# Patient Record
Sex: Male | Born: 1965
Health system: Southern US, Community
[De-identification: ages and names within clinical notes are randomized; demographics above are authoritative.]

## PROBLEM LIST (undated history)

## (undated) DIAGNOSIS — N400 Enlarged prostate without lower urinary tract symptoms: Secondary | ICD-10-CM

## (undated) DIAGNOSIS — R519 Headache, unspecified: Secondary | ICD-10-CM

## (undated) DIAGNOSIS — K219 Gastro-esophageal reflux disease without esophagitis: Secondary | ICD-10-CM

## (undated) DIAGNOSIS — S37009A Unspecified injury of unspecified kidney, initial encounter: Secondary | ICD-10-CM

## (undated) DIAGNOSIS — R7303 Prediabetes: Secondary | ICD-10-CM

## (undated) DIAGNOSIS — R0600 Dyspnea, unspecified: Secondary | ICD-10-CM

## (undated) DIAGNOSIS — N289 Disorder of kidney and ureter, unspecified: Secondary | ICD-10-CM

## (undated) DIAGNOSIS — J309 Allergic rhinitis, unspecified: Secondary | ICD-10-CM

## (undated) DIAGNOSIS — R42 Dizziness and giddiness: Secondary | ICD-10-CM

## (undated) DIAGNOSIS — R51 Headache: Secondary | ICD-10-CM

## (undated) DIAGNOSIS — F4310 Post-traumatic stress disorder, unspecified: Secondary | ICD-10-CM

## (undated) DIAGNOSIS — R269 Unspecified abnormalities of gait and mobility: Secondary | ICD-10-CM

## (undated) DIAGNOSIS — M1990 Unspecified osteoarthritis, unspecified site: Secondary | ICD-10-CM

## (undated) DIAGNOSIS — I639 Cerebral infarction, unspecified: Secondary | ICD-10-CM

## (undated) DIAGNOSIS — S0990XA Unspecified injury of head, initial encounter: Secondary | ICD-10-CM

## (undated) DIAGNOSIS — F40240 Claustrophobia: Secondary | ICD-10-CM

## (undated) DIAGNOSIS — G9520 Unspecified cord compression: Secondary | ICD-10-CM

## (undated) DIAGNOSIS — F419 Anxiety disorder, unspecified: Secondary | ICD-10-CM

## (undated) DIAGNOSIS — G8929 Other chronic pain: Secondary | ICD-10-CM

## (undated) DIAGNOSIS — T8859XA Other complications of anesthesia, initial encounter: Secondary | ICD-10-CM

## (undated) DIAGNOSIS — N1832 Chronic kidney disease, stage 3b: Secondary | ICD-10-CM

## (undated) DIAGNOSIS — J45909 Unspecified asthma, uncomplicated: Secondary | ICD-10-CM

## (undated) DIAGNOSIS — R531 Weakness: Secondary | ICD-10-CM

## (undated) DIAGNOSIS — M436 Torticollis: Secondary | ICD-10-CM

## (undated) HISTORY — DX: Other chronic pain: G89.29

## (undated) HISTORY — PX: FINGER FRACTURE SURGERY: SHX638

## (undated) HISTORY — DX: Headache: R51

## (undated) HISTORY — PX: NECK SURGERY: SHX720

## (undated) HISTORY — PX: EYE MUSCLE SURGERY: SHX370

## (undated) HISTORY — PX: BACK SURGERY: SHX140

## (undated) HISTORY — PX: SHOULDER SURGERY: SHX246

## (undated) HISTORY — PX: DIAGNOSTIC LAPAROSCOPY: SUR761

## (undated) HISTORY — DX: Dizziness and giddiness: R42

## (undated) HISTORY — DX: Unspecified abnormalities of gait and mobility: R26.9

## (undated) HISTORY — DX: Allergic rhinitis, unspecified: J30.9

## (undated) HISTORY — DX: Headache, unspecified: R51.9

---

## 1988-06-29 HISTORY — PX: SHOULDER SURGERY: SHX246

## 1994-06-29 HISTORY — PX: BACK SURGERY: SHX140

## 2008-06-29 DIAGNOSIS — S0990XA Unspecified injury of head, initial encounter: Secondary | ICD-10-CM

## 2008-06-29 HISTORY — DX: Unspecified injury of head, initial encounter: S09.90XA

## 2008-10-11 DIAGNOSIS — K219 Gastro-esophageal reflux disease without esophagitis: Secondary | ICD-10-CM | POA: Insufficient documentation

## 2008-10-11 DIAGNOSIS — M5382 Other specified dorsopathies, cervical region: Secondary | ICD-10-CM | POA: Insufficient documentation

## 2008-10-11 DIAGNOSIS — M5000 Cervical disc disorder with myelopathy, unspecified cervical region: Secondary | ICD-10-CM | POA: Insufficient documentation

## 2008-10-11 DIAGNOSIS — F419 Anxiety disorder, unspecified: Secondary | ICD-10-CM | POA: Insufficient documentation

## 2009-02-04 ENCOUNTER — Ambulatory Visit: Payer: Self-pay | Admitting: Family Medicine

## 2009-02-04 ENCOUNTER — Observation Stay (HOSPITAL_COMMUNITY)
Admission: EM | Admit: 2009-02-04 | Discharge: 2009-02-07 | Payer: Self-pay | Source: Home / Self Care | Admitting: Emergency Medicine

## 2009-02-07 ENCOUNTER — Ambulatory Visit: Payer: Self-pay | Admitting: Physical Medicine & Rehabilitation

## 2009-11-15 ENCOUNTER — Encounter: Admission: RE | Admit: 2009-11-15 | Discharge: 2009-11-15 | Payer: Self-pay | Admitting: Neurosurgery

## 2009-12-26 ENCOUNTER — Encounter: Admission: RE | Admit: 2009-12-26 | Discharge: 2009-12-26 | Payer: Self-pay | Admitting: Neurosurgery

## 2010-08-08 ENCOUNTER — Other Ambulatory Visit (HOSPITAL_COMMUNITY): Payer: Self-pay | Admitting: Neurosurgery

## 2010-08-08 DIAGNOSIS — M47812 Spondylosis without myelopathy or radiculopathy, cervical region: Secondary | ICD-10-CM

## 2010-08-18 ENCOUNTER — Ambulatory Visit (HOSPITAL_COMMUNITY)
Admission: RE | Admit: 2010-08-18 | Discharge: 2010-08-18 | Disposition: A | Discharge status: Home / Self Care | Payer: Worker's Compensation | Source: Ambulatory Visit | Attending: Neurosurgery | Admitting: Neurosurgery

## 2010-08-18 DIAGNOSIS — M47812 Spondylosis without myelopathy or radiculopathy, cervical region: Secondary | ICD-10-CM

## 2010-08-18 DIAGNOSIS — M79609 Pain in unspecified limb: Secondary | ICD-10-CM | POA: Insufficient documentation

## 2010-08-18 DIAGNOSIS — Z981 Arthrodesis status: Secondary | ICD-10-CM | POA: Insufficient documentation

## 2010-08-18 DIAGNOSIS — R259 Unspecified abnormal involuntary movements: Secondary | ICD-10-CM | POA: Insufficient documentation

## 2010-08-18 DIAGNOSIS — M542 Cervicalgia: Secondary | ICD-10-CM | POA: Insufficient documentation

## 2010-08-18 MED ORDER — IOHEXOL 300 MG/ML  SOLN
10.0000 mL | Freq: Once | INTRAMUSCULAR | Status: AC | PRN
  Administered 2010-08-18: 10 mL via INTRAVENOUS

## 2010-10-05 LAB — BASIC METABOLIC PANEL
BUN: 16 mg/dL (ref 6–23)
BUN: 16 mg/dL (ref 6–23)
BUN: 9 mg/dL (ref 6–23)
CO2: 23 mEq/L (ref 19–32)
CO2: 23 mEq/L (ref 19–32)
CO2: 24 mEq/L (ref 19–32)
Calcium: 8.8 mg/dL (ref 8.4–10.5)
Calcium: 8.8 mg/dL (ref 8.4–10.5)
Calcium: 8.9 mg/dL (ref 8.4–10.5)
Chloride: 103 mEq/L (ref 96–112)
Chloride: 106 mEq/L (ref 96–112)
Chloride: 109 mEq/L (ref 96–112)
Creatinine, Ser: 1.34 mg/dL (ref 0.4–1.5)
Creatinine, Ser: 1.36 mg/dL (ref 0.4–1.5)
Creatinine, Ser: 1.39 mg/dL (ref 0.4–1.5)
GFR calc Af Amer: >60 mL/min (ref >60)
GFR calc Af Amer: >60 mL/min (ref >60)
GFR calc Af Amer: >60 mL/min (ref >60)
GFR calc non Af Amer: 55 mL/min — ABNORMAL LOW (ref >60)
GFR calc non Af Amer: 57 mL/min — ABNORMAL LOW (ref >60)
GFR calc non Af Amer: 58 mL/min — ABNORMAL LOW (ref >60)
Glucose, Bld: 135 mg/dL — ABNORMAL HIGH (ref 70–99)
Glucose, Bld: 175 mg/dL — ABNORMAL HIGH (ref 70–99)
Glucose, Bld: 86 mg/dL (ref 70–99)
Potassium: 3.8 mEq/L (ref 3.5–5.1)
Potassium: 4 mEq/L (ref 3.5–5.1)
Potassium: 4.2 mEq/L (ref 3.5–5.1)
Sodium: 135 mEq/L (ref 135–145)
Sodium: 138 mEq/L (ref 135–145)
Sodium: 139 mEq/L (ref 135–145)

## 2010-10-05 LAB — CBC
HCT: 43.9 % (ref 39.0–52.0)
HCT: 45.4 % (ref 39.0–52.0)
Hemoglobin: 15.1 g/dL (ref 13.0–17.0)
Hemoglobin: 15.6 g/dL (ref 13.0–17.0)
MCHC: 34.3 g/dL (ref 30.0–36.0)
MCHC: 34.3 g/dL (ref 30.0–36.0)
MCV: 92.7 fL (ref 78.0–100.0)
MCV: 93 fL (ref 78.0–100.0)
Platelets: 168 10*3/uL (ref 150–400)
Platelets: 174 10*3/uL (ref 150–400)
RBC: 4.73 MIL/uL (ref 4.22–5.81)
RBC: 4.9 MIL/uL (ref 4.22–5.81)
RDW: 12.4 % (ref 11.5–15.5)
RDW: 12.4 % (ref 11.5–15.5)
WBC: 4.5 10*3/uL (ref 4.0–10.5)
WBC: 7.7 10*3/uL (ref 4.0–10.5)

## 2010-10-05 LAB — COMPREHENSIVE METABOLIC PANEL
ALT: 20 U/L (ref 0–53)
AST: 20 U/L (ref 0–37)
Albumin: 3.6 g/dL (ref 3.5–5.2)
Alkaline Phosphatase: 59 U/L (ref 39–117)
BUN: 15 mg/dL (ref 6–23)
CO2: 25 mEq/L (ref 19–32)
Calcium: 9.3 mg/dL (ref 8.4–10.5)
Chloride: 107 mEq/L (ref 96–112)
Creatinine, Ser: 1.42 mg/dL (ref 0.4–1.5)
GFR calc Af Amer: >60 mL/min (ref >60)
GFR calc non Af Amer: 54 mL/min — ABNORMAL LOW (ref >60)
Glucose, Bld: 132 mg/dL — ABNORMAL HIGH (ref 70–99)
Potassium: 4.7 mEq/L (ref 3.5–5.1)
Sodium: 138 mEq/L (ref 135–145)
Total Bilirubin: 1.1 mg/dL (ref 0.3–1.2)
Total Protein: 6.2 g/dL (ref 6.0–8.3)

## 2010-10-05 LAB — DIFFERENTIAL
Basophils Absolute: 0 10*3/uL (ref 0.0–0.1)
Basophils Relative: 0 % (ref 0–1)
Eosinophils Absolute: 0.1 10*3/uL (ref 0.0–0.7)
Eosinophils Relative: 1 % (ref 0–5)
Lymphocytes Relative: 25 % (ref 12–46)
Lymphs Abs: 1.1 10*3/uL (ref 0.7–4.0)
Monocytes Absolute: 0.4 10*3/uL (ref 0.1–1.0)
Monocytes Relative: 9 % (ref 3–12)
Neutro Abs: 2.9 10*3/uL (ref 1.7–7.7)
Neutrophils Relative %: 64 % (ref 43–77)

## 2010-10-05 LAB — CSF CELL COUNT WITH DIFFERENTIAL
Eosinophils, CSF: 0 % (ref 0–1)
Lymphs, CSF: 66 % (ref 40–80)
Monocyte-Macrophage-Spinal Fluid: 30 % (ref 15–45)
RBC Count, CSF: 11 /mm3 — ABNORMAL HIGH
Segmented Neutrophils-CSF: 4 % (ref 0–6)
Tube #: 2
WBC, CSF: 2 /mm3 (ref 0–5)

## 2010-10-05 LAB — DRUGS OF ABUSE SCREEN W/O ALC, ROUTINE URINE
Amphetamine Screen, Ur: NEGATIVE
Barbiturate Quant, Ur: NEGATIVE
Benzodiazepines.: NEGATIVE
Cocaine Metabolites: NEGATIVE
Creatinine,U: 146.6 mg/dL
Marijuana Metabolite: NEGATIVE
Methadone: NEGATIVE
Opiate Screen, Urine: NEGATIVE
Phencyclidine (PCP): NEGATIVE
Propoxyphene: NEGATIVE

## 2010-10-05 LAB — RPR: RPR Ser Ql: NONREACTIVE

## 2010-10-05 LAB — TSH: TSH: 0.997 u[IU]/mL (ref 0.350–4.500)

## 2010-10-05 LAB — GLUCOSE, CAPILLARY
Glucose-Capillary: 121 mg/dL — ABNORMAL HIGH (ref 70–99)
Glucose-Capillary: 128 mg/dL — ABNORMAL HIGH (ref 70–99)
Glucose-Capillary: 134 mg/dL — ABNORMAL HIGH (ref 70–99)
Glucose-Capillary: 134 mg/dL — ABNORMAL HIGH (ref 70–99)
Glucose-Capillary: 139 mg/dL — ABNORMAL HIGH (ref 70–99)
Glucose-Capillary: 161 mg/dL — ABNORMAL HIGH (ref 70–99)
Glucose-Capillary: 165 mg/dL — ABNORMAL HIGH (ref 70–99)
Glucose-Capillary: 176 mg/dL — ABNORMAL HIGH (ref 70–99)
Glucose-Capillary: 176 mg/dL — ABNORMAL HIGH (ref 70–99)
Glucose-Capillary: 195 mg/dL — ABNORMAL HIGH (ref 70–99)

## 2010-10-05 LAB — HEMOGLOBIN A1C
Hgb A1c MFr Bld: 5.3 % (ref 4.6–6.1)
Mean Plasma Glucose: 105 mg/dL

## 2010-10-05 LAB — PROTEIN AND GLUCOSE, CSF
Glucose, CSF: 86 mg/dL — ABNORMAL HIGH (ref 43–76)
Total  Protein, CSF: 57 mg/dL — ABNORMAL HIGH (ref 15–45)

## 2010-10-05 LAB — VITAMIN B12: Vitamin B-12: 362 pg/mL (ref 211–911)

## 2010-10-05 LAB — SEDIMENTATION RATE: Sed Rate: 2 mm/hr (ref 0–16)

## 2010-11-11 NOTE — Discharge Summary (Signed)
NAMEDIQUAN, CHEHAB NO.:  [id number]   MEDICAL RECORD NO.:  [id number]          PATIENT TYPE:  OBV   LOCATION:  3004                         FACILITY:  [facility]   PHYSICIAN:  Dickie La, MD        DATE OF BIRTH:  10-08-1965   DATE OF ADMISSION:  02/04/2009  DATE OF DISCHARGE:  02/07/2009                               DISCHARGE SUMMARY   PRIMARY CARE Ardis Lawley:  Dr. Rosanna Randy in [facility], phone  number [phone].   DISCHARGE DIAGNOSES:  1. Cervical myelopathy  2. Hyperglycemia secondary to steroids.  3. Tobacco abuse.  4. Chronic kidney disease   DISCHARGE MEDICATIONS:  1. Motrin 600 mg p.o. q.8 h. p.r.n. pain.  2. Dexamethasone 2 mg p.o. q.6 h. x3 days.  3. Omeprazole 20 mg p.o. daily x1 week.   CONSULTS:  [facility] initially consulted Dr. Joya Salm,  however, his primary neurosurgeon is Dr. Carloyn Manner at the same practice, the  phone number is [phone].   PROCEDURES:  He had a Interventional Radiology guided a lumbar puncture  and myelogram on February 06, 2009, which was without complications.   LABORATORY DATA:  CBC upon admission was essentially within normal  limits.  BMET upon admission was within normal limits with the exception  of a creatinine of 1.39.  TSH, B12, sed rate were all within normal  limits, as was the urine drug screen, RPR, and A1c.  His creatinine  maintained at baseline of the high 1.3  His lumbar puncture results were  glucose of 86, slightly elevated total protein of 57, slightly elevated  cell count with 2 white blood cells and 11 red blood cells.  His  discharge labs were also essentially within normal limits with the  exception of slightly elevated glucose of 175.  Of note, his glucose  that elevate to the lower 200s, high 100s following initiation of  dexamethasone.  It was controlled with sliding scale insulin.   BRIEF HOSPITAL COURSE:  Mr. Helming is a 45 year old male with a past  medical history  significant for prior cervical surgery for cervical  spinal stenosis with fusion of C4 through C7 done in 1996.  He presented  to the ED after having some  trauma to his head during fall. He had  right-sided weakness.  His physical exam was significant for right-sided  weakness upper and lower extremity and myelopathic signs.  He had  cervical and brain MRI that was icomplicated by artifact from indwelling  cervical harware from previous neck surgery.  CT myelogram was ordered,  which did not show  spinal stenosis.  [facility],was  consulted and they recommended no surgery.recommendation was for steroid  therapy and outpatient rehab.  He responded well to oral steroid therapy  and was discharged with instructions for outpatient physical therapy.  Of note, as noted above, his glucose did trend upwards with steroids.  He was controlled with sliding scale insulin of around about 5 units per  day.  He was not discharged on any insulin as his plan was to continue  steroids for only 3 more  days.   PENDING ISSUES TO BE FOLLOWED UP:  Mr. Vado did have hyperglycemia,  which is secondary to steroids.  We would encourage his primary care  Rei Medlen to followup the fasting glucose level when he presents after  cessation of the steroid use.   FOLLOWUP APPOINTMENTS:  He was asked to schedule followup appointments  with Dr. Carloyn Manner at [facility] in 10-14 days and Dr. Rosanna Randy at  [facility] in 10-14 days.  He was provided phone  numbers.   DISCHARGE INSTRUCTIONS:  Mr. Weatherley was discharged with a diabetic  diet.  His activity restrictions were per physical therapies and  instructed to use a walker to ambulate secondary to a increased fall  risk.  We asked him not to return to work until he is cleared by his  neurosurgeon Dr. Carloyn Manner and his check up in 1-2 weeks.   DISCHARGE CONDITION:  The patient was discharged to home in stable  medical condition.      Lynne Leader, MD  Electronically Signed      Dickie La, MD  Electronically Signed    EC/MEDQ  D:  02/07/2009  T:  02/07/2009  Job:  [id number]   cc:   Althea Charon, M.D.

## 2010-11-11 NOTE — H&P (Signed)
NAMEASHUR, HUDMAN NO.:  [id number]   MEDICAL RECORD NO.:  [id number]          PATIENT TYPE:  OBV   LOCATION:  3004                         FACILITY:  [facility]   PHYSICIAN:  Dickie La, MD        DATE OF BIRTH:  1965-09-17   DATE OF ADMISSION:  02/04/2009  DATE OF DISCHARGE:                              HISTORY & PHYSICAL   PRIMARY CARE Kaelyn Nauta:  Dr. Miguel Aschoff at [facility].   HISTORY OF PRESENT ILLNESS:  The patient presents to [facility] ED  following 1 week of worsening right-sided weakness and numbness.  Mr.  Camera past medical history is significant for cervical spinal  fusion, following what appears spinal stenosis in 1996.  One and a half-  weeks ago, Mr. Mihelic hit the top of his head on a pipe while he was  climbing a ladder at work.  He felt as though he may pass out at that  time, but he did not.  He then climbed down and continued working.  His  current symptoms began a few days after he hit his head, which would be  about 1 week ago.  He describes a feeling of numbness and tingling in  his right arm and leg.  The feeling extends into his entire hand and  down his right leg, but does not include his foot.  He also describes  subtle weakness in his right hand. He has not been able to use or hold a  screwdriver correctly.  He also complains of right cheek and tongue  numbness, but he has no slurred speech and denies any difficulty  swallowing.  He also complains of a whole body tremor when he moves and  after he moves.  However, he has had this in the past, but it is worse  now.  He also notes a band-like stabbing right-sided headache that comes  and goes, this occurs without aura.  He describes any vision change or  difficulty concentrating.  He denies any urinary incontinence or  retention of the bowel or bladder.  He has continued to work into the  symptoms, but they came so bothersome that he had to see a doctor  today.  He does also note some difficulty walking, but he denies any falling.   PAST MEDICAL HISTORY:  His tremor has been diagnosed at a benign  essential tremor which failed treatment with a beta blocker.  He also has an old and stable left eye deviation.  Otherwise he has no medical problems and takes no medication with the  exception of Tylenol and ibuprofen for pain occasionally.   ALLERGIES:  CODEINE, which make some nauseated.   PAST SURGICAL HISTORY:  In 1996, cervical fusion of C4 and C7 for what  appears to be spinal stenosis; records are not available at this time.   FAMILY HISTORY:  His grandparents have diabetes, and he has aunts and  uncles with ALS and Parkinson disease respectively.   SOCIAL HISTORY:  He lives in [place], [place], with his wife.  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  He dips about a 10 every 1-1/2  days, and he has no alcohol or drug use.   REVIEW OF SYSTEMS:  A 12-point review of systems is negative except for  what is noted in HPI.  Specifically, he denies any fever, chills, night  sweats, and has no systemic systems.  He also denies any chest pain or  difficulty breathing.   PHYSICAL EXAMINATION:  VITAL SIGNS:  Temperature is 98.1, heart rate is  64, blood pressure 118/77, respiration 20, and sating at 99% on room  air.  GENERAL:  He is a right-handed male and he is sitting on the exam room  bed, in no acute distress.  NECK:  He has no JVD noted.  We also noted an old scar on his dorsal  neck and on his left ventral neck.  LUNGS:  Clear to auscultation bilaterally.  HEART:  Regular rate and rhythm.  No murmurs, rubs, or gallops.  ABDOMEN:  Soft and nontender with normoactive bowel sounds.  EXTREMITIES:  Nonedematous.  No rash, and no spooning or cyanosis of the  nails.  NEURO:  1. He is alert and oriented.  2. His speech is not slurred and his thought patterns are      comprehensible.  3. Cranial nerves:  II:  Grossly  intact.  III through VI:  Old left eye deviation.  This is not new.  Otherwise  EMOI and pupils equal and reactive to light.  V:  Right-sided decreased pinprick compared to left.  VII:  Right is equal to left.  VIII:  Grossly normal.  IX and X:  Right is equal to left.  Palate elevation is symmetric.  XI:  Right is equal to left.  XII:  Tongue deviates to right.  1. Sensation:  Pinprick, right is less than left on arm and lower      extremities, with no saddle anesthesia to history.  2. Strength:  He has pronator drift on his right arm.  Cervical roots      4-7, right is equal to left with the exception of grip where it is      subtle, right is less than left, which are described as a 4-1/2 out      of 5.  His lower extremities are 5/5 and symmetric.  He is able to      stand on his toes.  3. Reflexes:  He is hyperreflexic, but without clonus on his Achilles      as well as his biceps and brachioradialis.  His toes are downgoing      bilaterally.  His rectal tone is normal and he can squeeze.  4. Cerebellum:  He has no finger-to-nose finger ataxia.  However, his      gait appears to be ataxic and he looses his balance when asked to      stand with his eyes closed.  He cannot stand on one foot.   LABORATORY DATA:  CBC:  White count of 4.5, H and H 15.1/43.4, platelets  are 168, his PMNs are 68%.  His complete metabolic panel is within  normal limits with the exception of a creatinine which is 1.39.  He had  a noncontrast head CT, which showed no acute findings; and he has had AP  and lateral cervical films which showed no acute findings, and they  noted the stable internal hardware on the ventral surface of his neck.   ASSESSMENT AND PLAN:  This is a 45 year old male with right-sided  weakness.   PROBLEM:  1. Neuro:  This is true right-sided weakness.  He has pronator drift.      Differential includes a cervical stenosis with neck etiology versus      a brainstem finding or a subtle  subdural bleed.  We will evaluate      further with an MRI of the head and neck without contrast.  We have      consulted a radiologist to make this determination, and we feel it      is safe and will be a good test with the hardware he had in his      neck.  We will consult either Neurology or Neurosurgery depending      on the etiology and cause of these symptoms, depending on what we      find on the imaging study.  We do not feel that infection is a      likely cause due to his afebrile nature and lack of elevated white      blood cell counts.  We feel that his neck is stable based on our      exam showing no midline tenderness and normal for him, x-rays of      his neck.  Therefore, we will not put him in a cervical collar for      neck stabilization.  2. Renal function.  The patient is not a normal doctor goer, so we are      unable to determine if this is an acute renal injury or a chronic      renal disease.  We will follow daily labs and have him follow up      with the PCP as long as his renal function remained stable.  We      will attempt to avoid nephrotoxic agents.  3. Prophylaxis.  We will use heparin 5000 units 3 times a day      subcutaneous for VTE prophylaxis.  4. Disposition.  We will plan discharge pending the patient's need for      OR; at this time, we are unable to know how long he will stay in      the hospital.      Lynne Leader, MD  Electronically Signed      Dickie La, MD  Electronically Signed    EC/MEDQ  D:  02/04/2009  T:  02/05/2009  Job:  [id number]

## 2010-11-11 NOTE — Consult Note (Signed)
NAMEMUHANAD, BJORNSON NO.:  [id number]   MEDICAL RECORD NO.:  [id number]          PATIENT TYPE:  OBV   LOCATION:  3004                         FACILITY:  [facility]   PHYSICIAN:  Leeroy Cha, M.D.   DATE OF BIRTH:  1965/11/28   DATE OF CONSULTATION:  02/05/2009  DATE OF DISCHARGE:                                 CONSULTATION   Mr. Sickel is a gentleman who 5 days ago, he was at work and when he  tried to reach above him with his ladder, he hit his neck and  immediately he developed some electricity running down to his neck.  Nevertheless, he did not seek any medical treatment because he thought  it was some swelling and that will go away.  Yesterday, he came to the  emergency room, he was admitted by the Teaching Service.  It was noticed  that with this symptoms, he had difficulty walking, he had tremor, and  he had to use a walker to get around.  He complaint of numbness in the  right side of the face, numbness in the right side of his body, and he  has difficulty walking.  Clinically, I cannot find any weakness, but his  reflexes are 3+  with no Babinski.  He has ataxia, he cannot walk in a  straight line.  He previously had a cervical fusion in 1996 from C4  through C7.  The cervical MRI was difficult to be read secondary to the  metal.  The MRI of the brain is normal.   CLINICAL IMPRESSION:  Cervical myelopathy secondary to herniated disk 3-  4, rule out spinal cord injury.   RECOMMENDATIONS:  I talked to Mr. Musselman personally.  We are going to  set him tomorrow to have a cervical myelogram and during the myelogram,  we are going to obtain a spinal fluid to be sent to the laboratory.  He  is really worried because he does not know what is going on, I told him  that hopefully with this test, we will find out what needs to be done.  Because I do not have any diagnosis yet, I am going to start him on p.o.  Decadron.            ______________________________  Leeroy Cha, M.D.     EB/MEDQ  D:  02/05/2009  T:  02/05/2009  Job:  [id number]

## 2011-03-03 ENCOUNTER — Other Ambulatory Visit (HOSPITAL_COMMUNITY): Payer: Self-pay | Admitting: Specialist

## 2011-03-03 DIAGNOSIS — R51 Headache: Secondary | ICD-10-CM

## 2011-03-03 DIAGNOSIS — R42 Dizziness and giddiness: Secondary | ICD-10-CM

## 2011-03-11 ENCOUNTER — Ambulatory Visit (HOSPITAL_COMMUNITY)
Admission: RE | Admit: 2011-03-11 | Discharge: 2011-03-11 | Disposition: A | Discharge status: Home / Self Care | Payer: Worker's Compensation | Source: Ambulatory Visit | Attending: Specialist | Admitting: Specialist

## 2011-03-11 DIAGNOSIS — R51 Headache: Secondary | ICD-10-CM

## 2011-03-11 DIAGNOSIS — R42 Dizziness and giddiness: Secondary | ICD-10-CM | POA: Insufficient documentation

## 2011-03-11 LAB — CREATININE, SERUM
Creatinine, Ser: 1.36 mg/dL — ABNORMAL HIGH (ref 0.50–1.35)
GFR calc Af Amer: >60 mL/min (ref >60)
GFR calc non Af Amer: 56 mL/min — ABNORMAL LOW (ref >60)

## 2011-03-11 LAB — BUN: BUN: 13 mg/dL (ref 6–23)

## 2011-03-11 MED ORDER — GADOBENATE DIMEGLUMINE 529 MG/ML IV SOLN
20.0000 mL | Freq: Once | INTRAVENOUS | Status: AC
  Administered 2011-03-11: 20 mL via INTRAVENOUS

## 2011-03-12 ENCOUNTER — Emergency Department (HOSPITAL_COMMUNITY)
Admission: EM | Admit: 2011-03-12 | Discharge: 2011-03-12 | Discharge status: Against Medical Advice | Payer: Self-pay | Attending: Emergency Medicine | Admitting: Emergency Medicine

## 2011-03-12 DIAGNOSIS — R238 Other skin changes: Secondary | ICD-10-CM | POA: Insufficient documentation

## 2011-03-12 DIAGNOSIS — R509 Fever, unspecified: Secondary | ICD-10-CM | POA: Insufficient documentation

## 2011-03-23 ENCOUNTER — Ambulatory Visit: Payer: Self-pay | Admitting: Family Medicine

## 2011-03-27 ENCOUNTER — Ambulatory Visit: Payer: Self-pay | Admitting: Family Medicine

## 2012-08-19 ENCOUNTER — Ambulatory Visit: Payer: Self-pay | Admitting: Family Medicine

## 2013-03-14 ENCOUNTER — Encounter: Payer: Self-pay | Admitting: Pulmonary Disease

## 2013-03-14 ENCOUNTER — Ambulatory Visit (INDEPENDENT_AMBULATORY_CARE_PROVIDER_SITE_OTHER): Payer: BC Managed Care – PPO | Admitting: Pulmonary Disease

## 2013-03-14 VITALS — BP 104/70 | HR 74 | Temp 98.0°F | Ht 72.0 in | Wt 222.0 lb

## 2013-03-14 DIAGNOSIS — R0602 Shortness of breath: Secondary | ICD-10-CM

## 2013-03-14 NOTE — Assessment & Plan Note (Addendum)
Randy Dean has a normal physical exam aside from some mild weakness in the right arm and leg which he attributes to his traumatic brain injury. His lung exam and cardiac exam are normal and do not show evidence of clear pulmonary cardiac disease. Further, his ambulatory oximetry testing was normal.  I explained to him that shortness of breath concomitant from multiple different problems including neurologic conditions, pulmonary conditions, cardiac conditions, and hematologic conditions to name a few. We will obtain records from his primary care doctor's office to see the results of his most recent lab work. I agree with the cardiology evaluation which will happen this week.  From a pulmonary perspective, we will order full pulmonary function testing as well as a repeat chest x-ray to look for evidence of underlying pulmonary disease. At this point, I don't see clear evidence of any which is a good thing for him.  I suspect that there is an anxiety component here.  Followup 2 weeks.

## 2013-03-14 NOTE — Progress Notes (Signed)
Subjective:    Patient ID: Randy Dean, male    DOB: [birth year], 47 y.o.   MRN: [id number]  HPI  Randy Dean is a very pleasant 47 year old male who comes to our clinic to establish care for shortness of breath. He never had respiratory illnesses as a child and smoked one half pack of cigarettes daily from age 53 until age 33. He never smoked after that. 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 While working for a [employer], he unfortunately sustained major head trauma from a steel bar which led to traumatic brain injury as well as cervical spinal injury. Neither of these injuries required surgical repair.   In the last year he has experienced increasing shortness of breath. This started gradually and has been slowly progressing. He does feel some shortness of breath with minimal activity such as walking on level ground from 50-75 feet in distance. He will occasionally at rest feel the need to gasp for air. Nothing clearly makes the shortness of breath better. He does note that the mold will make his shortness of breath worse. He does not have mold in his home but he is at his church nearly every day where he is exposed to mold as they have a damp basement. He has noted even some shortness of breath in his sleep and now he has to sleep sitting up. When he is in the shower he cannot hold his breath long enough to read soft his face. He has cough productive of clear sputum which is sometimes green. He has not tried inhaled therapies. He occasionally has some chest pain that is not always associated with the shortness of breath. It is a sharp pain sometimes isolated to the right chest other times in the left chest, sometimes both. He is going to see cardiology for this this week.   Past Medical History  Diagnosis Date  . Allergic rhinitis   . Chronic headaches   . Vertigo      Family  History  Problem Relation Age of Onset  . Fibromyalgia Mother   . Heart disease Father   . Tremor Father   . Diabetes Maternal Grandmother   . Hypertension Maternal Grandmother   . Hypertension Maternal Grandfather   . Diabetes Paternal Grandmother   . Hypertension Paternal Grandmother   . Hypertension Paternal Grandfather      History   Social History  . Marital Status: Married    Spouse Name: N/A    Number of Children: N/A  . Years of Education: N/A   Occupational History  . Not on file.   Social History Main Topics  . Smoking status: Never Smoker   . Smokeless tobacco: Current User  . Alcohol Use: No  . Drug Use: No  . Sexual Activity: Not on file   Other Topics Concern  . Not on file   Social History Narrative  . No narrative on file     Allergies  Allergen Reactions  . Codeine   . Serzone [Nefazodone]      No outpatient prescriptions prior to visit.   No facility-administered medications prior to visit.       Review of Systems  Constitutional: Positive for appetite change. Negative for fever, chills, diaphoresis, activity change, fatigue and unexpected weight change.  HENT: Positive for congestion and sneezing. Negative for hearing loss, ear pain, nosebleeds, sore throat,  facial swelling, rhinorrhea, mouth sores, trouble swallowing, neck pain, neck stiffness, dental problem, voice change, postnasal drip, sinus pressure, tinnitus and ear discharge.   Eyes: Negative for photophobia, discharge, itching and visual disturbance.  Respiratory: Positive for cough and shortness of breath. Negative for apnea, choking, chest tightness, wheezing and stridor.   Cardiovascular: Positive for chest pain. Negative for palpitations and leg swelling.  Gastrointestinal: Positive for abdominal pain. Negative for nausea, vomiting, constipation, blood in stool and abdominal distention.  Genitourinary: Negative for dysuria, urgency, frequency, hematuria, flank pain, decreased  urine volume and difficulty urinating.  Musculoskeletal: Positive for joint swelling. Negative for myalgias, back pain, arthralgias and gait problem.  Skin: Negative for color change, pallor and rash.       Itching  Neurological: Positive for headaches. Negative for dizziness, tremors, seizures, syncope, speech difficulty, weakness, light-headedness and numbness.  Hematological: Negative for adenopathy. Does not bruise/bleed easily.  Psychiatric/Behavioral: Negative for confusion, sleep disturbance and agitation. The patient is nervous/anxious.        Objective:   Physical Exam  Filed Vitals:   03/14/13 1144  BP: 104/70  Pulse: 74  Temp: 98 F (36.7 C)  TempSrc: Oral  Height: 6' (1.829 m)  Weight: 222 lb (100.699 kg)  SpO2: 97%   Gen: well appearing, no acute distress HEENT: NCAT, PERRL, L eye laterally deviated, otherwise R EOMi, OP clear, neck supple without masses PULM: CTA B CV: RRR, no mgr, no JVD AB: BS+, soft, nontender, no hsm Ext: warm, no edema, no clubbing, no cyanosis Derm: no rash or skin breakdown Neuro: A&Ox4, CN II-XII intact, strength 5/5 in Right, 4+/5 L grip, hip flexor and      Assessment & Plan:   Shortness of breath Randy Dean has a normal physical exam aside from some mild weakness in the right arm and leg which he attributes to his traumatic brain injury. His lung exam and cardiac exam are normal and do not show evidence of clear pulmonary cardiac disease. Further, his ambulatory oximetry testing was normal.  I explained to him that shortness of breath concomitant from multiple different problems including neurologic conditions, pulmonary conditions, cardiac conditions, and hematologic conditions to name a few. We will obtain records from his primary care doctor's office to see the results of his most recent lab work. I agree with the cardiology evaluation which will happen this week.  From a pulmonary perspective, we will order full pulmonary function  testing as well as a repeat chest x-ray to look for evidence of underlying pulmonary disease. At this point, I don't see clear evidence of any which is a good thing for him.  I suspect that there is an anxiety component here.  Followup 2 weeks.    Updated Medication List Outpatient Encounter Prescriptions as of 03/14/2013  Medication Sig Dispense Refill  . ALPRAZolam (XANAX) 0.5 MG tablet Take 1 tablet by mouth 2 (two) times daily as needed.      Marland Kitchen dexlansoprazole (DEXILANT) 60 MG capsule Take 60 mg by mouth daily.      . diazepam (VALIUM) 5 MG tablet Take 2.5-5 mg by mouth at bedtime as needed for anxiety.      . gabapentin (NEURONTIN) 100 MG capsule Take 200 mg by mouth 3 (three) times daily.      Marland Kitchen loratadine (CLARITIN) 10 MG tablet Take 10 mg by mouth daily.      . meloxicam (MOBIC) 15 MG tablet Take 15 mg by mouth daily.      Marland Kitchen  mometasone-formoterol (DULERA) 100-5 MCG/ACT AERO Inhale 2 puffs into the lungs 2 (two) times daily.      . Phosphatidylserine-DHA-EPA (VAYARIN) 75-21.5-8.5 MG CAPS Take 2 capsules by mouth daily.       No facility-administered encounter medications on file as of 03/14/2013.

## 2013-03-14 NOTE — Patient Instructions (Signed)
Please have Dr. Bea Laura send [place] the results of his office visit and tests We will set up full pulmonary function testing and a Chest X-ray for you and call you with the results.  We will see you back in 2 weeks or sooner if needed

## 2013-03-21 ENCOUNTER — Telehealth: Payer: Self-pay | Admitting: Pulmonary Disease

## 2013-03-21 ENCOUNTER — Ambulatory Visit (INDEPENDENT_AMBULATORY_CARE_PROVIDER_SITE_OTHER)
Admission: RE | Admit: 2013-03-21 | Discharge: 2013-03-21 | Disposition: A | Discharge status: Home / Self Care | Payer: BC Managed Care – PPO | Source: Ambulatory Visit | Attending: Internal Medicine | Admitting: Internal Medicine

## 2013-03-21 ENCOUNTER — Ambulatory Visit (INDEPENDENT_AMBULATORY_CARE_PROVIDER_SITE_OTHER): Payer: BC Managed Care – PPO | Admitting: Pulmonary Disease

## 2013-03-21 ENCOUNTER — Encounter: Payer: Self-pay | Admitting: Pulmonary Disease

## 2013-03-21 DIAGNOSIS — R0602 Shortness of breath: Secondary | ICD-10-CM

## 2013-03-21 LAB — PULMONARY FUNCTION TEST

## 2013-03-21 NOTE — Progress Notes (Signed)
PFT done today. 

## 2013-03-21 NOTE — Telephone Encounter (Signed)
I spoke with the pt and advised he has PFT set for today here in [place] so he can have CXR done at that time as well. Pt advised and is ok to do this. Order placed. Marcellus Bing, CMA

## 2013-03-28 ENCOUNTER — Encounter: Payer: Self-pay | Admitting: Pulmonary Disease

## 2013-03-28 ENCOUNTER — Ambulatory Visit (INDEPENDENT_AMBULATORY_CARE_PROVIDER_SITE_OTHER): Payer: BC Managed Care – PPO | Admitting: Pulmonary Disease

## 2013-03-28 VITALS — BP 120/80 | HR 92 | Ht 72.0 in | Wt 208.0 lb

## 2013-03-28 DIAGNOSIS — R0602 Shortness of breath: Secondary | ICD-10-CM

## 2013-03-28 NOTE — Patient Instructions (Addendum)
Call us with the results of your stress test are available If they are normal, we will refer you to the [facility] to evaluate vocal  Cord dysfunction [email or web address])  We will see you back in 3-4 months or sooner if needed

## 2013-03-28 NOTE — Progress Notes (Signed)
Subjective:    Patient ID: Randy Dean, male    DOB: [birth year], 47 y.o.   MRN: [id number]  Synopsis: Dude Tona first saw the [facility] in 01/2013 for shortness of breath.  He had traumatic brain injury several years prior to the admission.  He had normal PFT's and a Chest X-ray after his first evaluation.       HPI  03/28/2013 ROV > [name] feels that he his doing about the same as the last visit, no better or worse.  He is producing clear phlegm occasionally.  He is having significant sinus drainage, in that he is blowing his nose a lot.  This is usually worse in the evenings.  He has more build up in terms of chest congestion after he eats.  He says that he sometimes feels that he is getting clear, slimy drainage from his chest which is related to the eating.  He has had trouble swallowing in the past.   He has noted hoarseness and inability to sing lately.  Past Medical History  Diagnosis Date  . Allergic rhinitis   . Chronic headaches   . Vertigo      Review of Systems  Constitutional: Positive for fatigue. Negative for fever and chills.  HENT: Negative for nosebleeds, congestion and rhinorrhea.   Respiratory: Positive for shortness of breath. Negative for wheezing and stridor.   Cardiovascular: Positive for chest pain. Negative for palpitations and leg swelling.       Objective:   Physical Exam Filed Vitals:   03/28/13 1429  BP: 120/80  Pulse: 92  Height: 6' (1.829 m)  Weight: 208 lb (94.348 kg)  SpO2: 98%   Gen: anxious HEENT: disconjugate gaze, OP clear PULM: CTA B, no stridor CV: RRR, no mgr AB: Soft, nontender Ext: warm, no edema  February 2014 chest x-ray normal 02/2013 EKG normal 02/2013 spirometry > ratio 82%, FEV1 3.04 L (82% pred), FVC 3.65 L (80% pred) 03/21/2013 full pulmonary function test>> ratio 83%, FEV1 3.84 L (97% predicted), total lung capacity 5.89 L (84% predicted), DLCO 27.08 (83% predicted)     Assessment & Plan:    Shortness of breath I am encouraged by the fact that Seth's physical exam, oximetry, chest x-ray, and full pulmonary function testing are completely normal. I see no evidence of an underlying lung disease. I agree completely with the plan for a stress test which will be performed tomorrow.   I seriously question vocal cord dysfunction with a strong anxiety component. This would be consistent with his hoarseness and inability to "get a deep breath". He does appear dyspneic, however with normal vital signs and the duration of this illness (several weeks to months) is very unlikely that he has impending respiratory failure. Further, he shows no objective signs of respiratory failure.  Plan: -Stress test and echocardiogram tomorrow -If those tests are normal then I will refer him to the [facility] for evaluation of vocal cord dysfunction   Updated Medication List Outpatient Encounter Prescriptions as of 03/28/2013  Medication Sig Dispense Refill  . ALPRAZolam (XANAX) 0.5 MG tablet Take 1 tablet by mouth 2 (two) times daily as needed.      Marland Kitchen dexlansoprazole (DEXILANT) 60 MG capsule Take 60 mg by mouth daily.      . diazepam (VALIUM) 5 MG tablet Take 2.5-5 mg by mouth at bedtime as needed for anxiety.      . gabapentin (NEURONTIN) 100 MG capsule Take 200 mg by mouth 3 (  three) times daily.      Marland Kitchen loratadine (CLARITIN) 10 MG tablet Take 10 mg by mouth daily.      . meloxicam (MOBIC) 15 MG tablet Take 15 mg by mouth daily.      . mometasone-formoterol (DULERA) 100-5 MCG/ACT AERO Inhale 2 puffs into the lungs 2 (two) times daily.      . Phosphatidylserine-DHA-EPA (VAYARIN) 75-21.5-8.5 MG CAPS Take 2 capsules by mouth daily.       No facility-administered encounter medications on file as of 03/28/2013.

## 2013-03-28 NOTE — Assessment & Plan Note (Signed)
I am encouraged by the fact that Randy Dean's physical exam, oximetry, chest x-ray, and full pulmonary function testing are completely normal. I see no evidence of an underlying lung disease. I agree completely with the plan for a stress test which will be performed tomorrow.   I seriously question vocal cord dysfunction with a strong anxiety component. This would be consistent with his hoarseness and inability to "get a deep breath". He does appear dyspneic, however with normal vital signs and the duration of this illness (several weeks to months) is very unlikely that he has impending respiratory failure. Further, he shows no objective signs of respiratory failure.  Plan: -Stress test and echocardiogram tomorrow -If those tests are normal then I will refer him to the [facility] for evaluation of vocal cord dysfunction

## 2013-06-06 ENCOUNTER — Telehealth: Payer: Self-pay

## 2013-06-06 ENCOUNTER — Telehealth: Payer: Self-pay | Admitting: Pulmonary Disease

## 2013-06-06 DIAGNOSIS — R0602 Shortness of breath: Secondary | ICD-10-CM

## 2013-06-06 NOTE — Telephone Encounter (Signed)
Call received from Dr. Tami Ribas with [facility].  No paradoxical vocal cord movement.  Tachypnea noted, improved with topical lidocaine.  Will schedule a follow up appointment with [place] with an ABG prior to the study.

## 2013-06-06 NOTE — Telephone Encounter (Signed)
Message copied by Len Blalock on Tue Jun 06, 2013  9:54 AM ------      Message from: Simonne Maffucci B      Created: Tue Jun 06, 2013  9:45 AM       A,            Can you schedule a follow up appointment for this patient to see me for shortness of breath?            Please request records of his recent cardiac stress test.              He needs an ABG beforehand at [facility].            Thanks,      B ------

## 2013-06-06 NOTE — Telephone Encounter (Signed)
Called pt to try and schedule ABG and appt with B[facility] next week per Dr. Lake Bells.  The pt stated that he "did not want to have testing done just to pad someone else's pocket", would think about it, and would call me back tomorrow if he wishes to proceed.  Nothing further needed at this time. Maebel Marasco L, CMA

## 2013-07-21 ENCOUNTER — Telehealth: Payer: Self-pay | Admitting: Pulmonary Disease

## 2013-07-21 NOTE — Telephone Encounter (Signed)
Pt doesn't wish to schedule an appointment at this time.  Nothing further needed at this time.  Randy Dean

## 2013-08-29 ENCOUNTER — Encounter: Payer: Self-pay | Admitting: Pulmonary Disease

## 2014-01-31 LAB — LIPID PANEL
Cholesterol: 209 mg/dL — AB (ref 0–200)
HDL: 35 mg/dL (ref 35–70)
LDL Cholesterol: 146 mg/dL
LDl/HDL Ratio: 4.2
Triglycerides: 142 mg/dL (ref 40–160)

## 2014-01-31 LAB — TSH: TSH: 1.02 u[IU]/mL (ref <=5.90)

## 2014-05-21 LAB — HEPATIC FUNCTION PANEL
ALT: 19 U/L (ref 10–40)
AST: 22 U/L (ref 14–40)
Alkaline Phosphatase: 66 U/L (ref 25–125)
Bilirubin, Total: 0.9 mg/dL

## 2014-05-21 LAB — CBC AND DIFFERENTIAL
HCT: 44 % (ref 41–53)
Hemoglobin: 15.7 g/dL (ref 13.5–17.5)
Neutrophils Absolute: 3 /uL
Platelets: 175 10*3/uL (ref 150–399)
WBC: 5.3 10^3/mL

## 2014-05-30 LAB — BASIC METABOLIC PANEL
BUN: 14 mg/dL (ref 4–21)
Creatinine: 1.6 mg/dL — AB (ref 0.6–1.3)
Glucose: 103 mg/dL
Potassium: 4.5 mmol/L (ref 3.4–5.3)
Sodium: 140 mmol/L (ref 137–147)

## 2014-05-30 LAB — PSA: PSA: 2

## 2014-11-23 DIAGNOSIS — F419 Anxiety disorder, unspecified: Secondary | ICD-10-CM

## 2014-11-23 DIAGNOSIS — M4802 Spinal stenosis, cervical region: Secondary | ICD-10-CM | POA: Insufficient documentation

## 2014-11-23 DIAGNOSIS — J302 Other seasonal allergic rhinitis: Secondary | ICD-10-CM | POA: Insufficient documentation

## 2014-11-23 DIAGNOSIS — E669 Obesity, unspecified: Secondary | ICD-10-CM | POA: Insufficient documentation

## 2014-11-23 DIAGNOSIS — S199XXA Unspecified injury of neck, initial encounter: Secondary | ICD-10-CM

## 2014-11-23 DIAGNOSIS — S069X9A Unspecified intracranial injury with loss of consciousness of unspecified duration, initial encounter: Secondary | ICD-10-CM | POA: Insufficient documentation

## 2014-11-23 DIAGNOSIS — M542 Cervicalgia: Secondary | ICD-10-CM | POA: Insufficient documentation

## 2014-11-23 DIAGNOSIS — S069XAA Unspecified intracranial injury with loss of consciousness status unknown, initial encounter: Secondary | ICD-10-CM | POA: Insufficient documentation

## 2014-11-23 DIAGNOSIS — R202 Paresthesia of skin: Secondary | ICD-10-CM | POA: Insufficient documentation

## 2014-11-23 DIAGNOSIS — F4310 Post-traumatic stress disorder, unspecified: Secondary | ICD-10-CM | POA: Insufficient documentation

## 2014-11-23 DIAGNOSIS — G4701 Insomnia due to medical condition: Secondary | ICD-10-CM | POA: Insufficient documentation

## 2014-11-23 DIAGNOSIS — E785 Hyperlipidemia, unspecified: Secondary | ICD-10-CM | POA: Insufficient documentation

## 2014-11-23 DIAGNOSIS — R200 Anesthesia of skin: Secondary | ICD-10-CM | POA: Insufficient documentation

## 2014-11-23 DIAGNOSIS — F32A Depression, unspecified: Secondary | ICD-10-CM | POA: Insufficient documentation

## 2014-11-23 DIAGNOSIS — R42 Dizziness and giddiness: Secondary | ICD-10-CM | POA: Insufficient documentation

## 2014-11-23 DIAGNOSIS — F329 Major depressive disorder, single episode, unspecified: Secondary | ICD-10-CM | POA: Insufficient documentation

## 2014-11-23 DIAGNOSIS — S0993XA Unspecified injury of face, initial encounter: Secondary | ICD-10-CM | POA: Insufficient documentation

## 2014-11-23 DIAGNOSIS — I879 Disorder of vein, unspecified: Secondary | ICD-10-CM | POA: Insufficient documentation

## 2014-11-23 DIAGNOSIS — R251 Tremor, unspecified: Secondary | ICD-10-CM | POA: Insufficient documentation

## 2015-01-08 ENCOUNTER — Encounter: Payer: Self-pay | Admitting: Family Medicine

## 2015-01-08 ENCOUNTER — Ambulatory Visit: Payer: Self-pay | Admitting: Family Medicine

## 2015-01-08 ENCOUNTER — Ambulatory Visit (INDEPENDENT_AMBULATORY_CARE_PROVIDER_SITE_OTHER): Payer: PPO | Admitting: Family Medicine

## 2015-01-08 ENCOUNTER — Telehealth: Payer: Self-pay

## 2015-01-08 VITALS — BP 112/70 | HR 70 | Temp 98.1°F | Resp 16 | Wt 220.0 lb

## 2015-01-08 DIAGNOSIS — F329 Major depressive disorder, single episode, unspecified: Secondary | ICD-10-CM | POA: Diagnosis not present

## 2015-01-08 DIAGNOSIS — F32A Depression, unspecified: Secondary | ICD-10-CM

## 2015-01-08 DIAGNOSIS — F419 Anxiety disorder, unspecified: Secondary | ICD-10-CM

## 2015-01-08 MED ORDER — DIAZEPAM 5 MG PO TABS: 5.0000 mg | ORAL_TABLET | Freq: Every day | ORAL | Status: DC

## 2015-01-08 NOTE — Telephone Encounter (Signed)
Received a call from Randy Dean regarding his home medications, according to him, he mentioned that he is taking bupropion 150 mg daily, dexilant 60 mg daily, fluticasone(flonase) 2 nose sprays once a day, loratadine 10 mg daily, and meloxicam 15 mg daily.  Pharmacy was also called to verify what medications have been refill on the last 3 months, awaiting for their fax.

## 2015-01-08 NOTE — Telephone Encounter (Signed)
Called pharmacy, [facility] for new Diazepam 5mg  prescription per MD's order. Talked to [facility] pharmacist.

## 2015-01-08 NOTE — Progress Notes (Signed)
Patient ID: Randy Dean, male   DOB: 1966/04/01, 49 y.o.   MRN: [id number]    Subjective:  Anxiety Presents for follow-up visit. Symptoms include confusion, decreased concentration, dizziness, nausea, nervous/anxious behavior, restlessness and shortness of breath. Patient reports no depressed mood. Primary symptoms comment: something in my neck pinching, and my motor skills little bit off" pt stated, some anxiety but not bad". Symptoms occur occasionally (everyu 2 - 3 days, I cannot do nothing because I do not know what to do" pt stated).     Pt was last sen 8 weeks ago for anxiety and depression. Unable to reconcile home medications due to pt "not able to remember"  Also neurologic symptoms associated with his TBI seemed to be slowly getting worse. His gait is unstable. The weakness and tremor on the right side are slowly getting worse.  Prior to Admission medications   Medication Sig Start Date End Date Taking? Authorizing Provider  ALPRAZolam Duanne Moron) 0.5 MG tablet Take 1 tablet by mouth 2 (two) times daily as needed. 03/13/13  Yes Historical Provider, MD  buPROPion (WELLBUTRIN XL) 150 MG 24 hr tablet Take by mouth. 08/16/14  Yes Historical Provider, MD  dexlansoprazole (DEXILANT) 60 MG capsule Take 60 mg by mouth daily.    Historical Provider, MD  dexlansoprazole (DEXILANT) 60 MG capsule Take by mouth. 07/02/14   Historical Provider, MD  diazepam (VALIUM) 5 MG tablet Take 2.5-5 mg by mouth at bedtime as needed for anxiety.    Historical Provider, MD  diazepam (VALIUM) 5 MG tablet Take by mouth. 03/01/14   Historical Provider, MD  doxycycline (VIBRAMYCIN) 100 MG capsule Take by mouth. 11/07/14   Historical Provider, MD  fluticasone (FLONASE) 50 MCG/ACT nasal spray Place into the nose. 11/05/14   Historical Provider, MD  gabapentin (NEURONTIN) 100 MG capsule Take 200 mg by mouth 3 (three) times daily.    Historical Provider, MD  loratadine (CLARITIN) 10 MG tablet Take 10 mg by mouth daily.     Historical Provider, MD  loratadine (CLARITIN) 10 MG tablet CLARITIN, 10MG  (Oral Tablet)  1 Every Day for 0 days  Quantity: 0.00;  Refills: 0   Ordered :30-Mar-2011  Abran Richard ;  Started 11-Oct-2008 Active 10/11/08   Historical Provider, MD  meloxicam (MOBIC) 15 MG tablet Take 15 mg by mouth daily.    Historical Provider, MD  meloxicam (MOBIC) 15 MG tablet Take by mouth. 08/27/14   Historical Provider, MD  mometasone-formoterol (DULERA) 100-5 MCG/ACT AERO Inhale 2 puffs into the lungs 2 (two) times daily.    Historical Provider, MD  Phosphatidylserine-DHA-EPA (VAYARIN) 75-21.5-8.5 MG CAPS Take 2 capsules by mouth daily.    Historical Provider, MD  venlafaxine [facility]) 75 MG tablet Take by mouth. 11/07/14   Historical Provider, MD    Patient Active Problem List   Diagnosis Date Noted  . Anxiety and depression 11/23/2014  . Brain injuries 11/23/2014  . Clinical depression 11/23/2014  . HLD (hyperlipidemia) 11/23/2014  . Insomnia due to medical condition 11/23/2014  . Injury of face and neck 11/23/2014  . Cervical pain 11/23/2014  . Loss of feeling or sensation 11/23/2014  . Tingling 11/23/2014  . Adiposity 11/23/2014  . Allergic rhinitis, seasonal 11/23/2014  . Neurosis, posttraumatic 11/23/2014  . Spinal stenosis in cervical region 11/23/2014  . Injury brain, traumatic 11/23/2014  . Has a tremor 11/23/2014  . Disease of vein 11/23/2014  . Head revolving around 11/23/2014  . Shortness of breath 03/14/2013  . Anxiety 10/11/2008  .  Intervertebral cervical disc disorder with myelopathy, cervical region 10/11/2008  . Musculoskeletal disorder and symptoms referable to neck 10/11/2008  . Acid reflux 10/11/2008    Past Medical History  Diagnosis Date  . Allergic rhinitis   . Chronic headaches   . Vertigo     History   Social History  . Marital Status: Married    Spouse Name: N/A  . Number of Children: N/A  . Years of Education: N/A   Occupational History  . Not on file.    Social History Main Topics  . Smoking status: Former Research scientist (life sciences)  . Smokeless tobacco: Former Systems developer    Quit date: 06/29/1986  . Alcohol Use: No  . Drug Use: No  . Sexual Activity: Not on file   Other Topics Concern  . Not on file   Social History Narrative    Allergies  Allergen Reactions  . Codeine   . Serzone [Nefazodone]     Review of Systems  Constitutional: Negative.   Respiratory: Positive for shortness of breath.   Gastrointestinal: Positive for nausea.  Musculoskeletal: Positive for neck pain.       Neck and arm and something pinching,   Neurological: Positive for dizziness, tingling and headaches.       Tingling all over and numbness on my face" pt stated  Psychiatric/Behavioral: Positive for confusion and decreased concentration. The patient is nervous/anxious.     Immunization History  Administered Date(s) Administered  . Influenza Split 03/29/2012  . Td 01/24/1996  . Tdap 04/27/2013   Objective:  BP 112/70 mmHg  Pulse 70  Temp(Src) 98.1 F (36.7 C) (Oral)  Resp 16  Wt 220 lb (99.791 kg)  Physical Exam  Constitutional: He is oriented to person, place, and time and well-developed, well-nourished, and in no distress.  HENT:  Head: Normocephalic and atraumatic.  Right Ear: External ear normal.  Left Ear: External ear normal.  Nose: Nose normal.  Eyes: Conjunctivae are normal. Pupils are equal, round, and reactive to light.  Neck: Normal range of motion.  Cardiovascular: Normal rate, regular rhythm and normal heart sounds.   Pulmonary/Chest: Effort normal and breath sounds normal.  Abdominal: Soft. Bowel sounds are normal.  Neurological: He is alert and oriented to person, place, and time.  Speech is somewhat slow and slurred. He has some chronic tremor of the right hand and arm and he has mild weakness of the entire right side from his old TBI.  Skin: Skin is warm and dry.  Psychiatric: Mood, memory, affect and judgment normal.    Lab Results   Component Value Date   WBC 5.3 05/21/2014   HGB 15.7 05/21/2014   HCT 44 05/21/2014   PLT 175 05/21/2014   GLUCOSE 175* 02/06/2009   CHOL 209* 01/31/2014   TRIG 142 01/31/2014   HDL 35 01/31/2014   LDLCALC 146 01/31/2014   TSH 1.02 01/31/2014   PSA 2.0 05/30/2014   HGBA1C  02/05/2009    5.3 (NOTE) The ADA recommends the following therapeutic goal for glycemic control related to Hgb A1c measurement: Goal of therapy: <6.5 Hgb A1c  Reference: American Diabetes Association: Clinical Practice Recommendations 2010, Diabetes Care, 2010, 33: (Suppl  1).    CMP     Component Value Date/Time   NA 140 05/30/2014   NA 138 02/06/2009 1225   K 4.5 05/30/2014   CL 106 02/06/2009 1225   CO2 23 02/06/2009 1225   GLUCOSE 175* 02/06/2009 1225   BUN 14 05/30/2014   BUN 13  03/11/2011 1014   CREATININE 1.6* 05/30/2014   CREATININE 1.36* 03/11/2011 1014   CALCIUM 8.8 02/06/2009 1225   PROT 6.2 02/05/2009 0530   ALBUMIN 3.6 02/05/2009 0530   AST 22 05/21/2014   ALT 19 05/21/2014   ALKPHOS 66 05/21/2014   BILITOT 1.1 02/05/2009 0530   GFRNONAA 56* 03/11/2011 1014   GFRAA >60 03/11/2011 1014    Assessment and Plan :  Acute on chronic anxiety  Discontinue Xanax and try diazepam 5 mg at bedtime and half of that in the morning and afternoon. I think she'll be able to tolerate this and will help him a good bit. See him back in a few weeks. He may actually need to see a psychiatrist  Traumatic brain injury  Right-sided physical injuries-impact from the TBI as noted above. He is also having some cognitive issues. May need to get back to see neurology. Depression We'll see how he does with anxiety from this visit. Muscle spasm, especially cervical I think the diazepam as above will help this cervical spasm. More  than 30 minutes spent with the patient and more than 50% spent in counseling.  I have done the exam and reviewed the above chart and it is accurate to the best of my  knowledge.     Miguel Aschoff MD [facility] 01/08/2015 2:42 PM

## 2015-01-16 ENCOUNTER — Ambulatory Visit: Payer: PPO | Admitting: Family Medicine

## 2015-01-24 ENCOUNTER — Telehealth: Payer: Self-pay | Admitting: Family Medicine

## 2015-01-24 NOTE — Telephone Encounter (Signed)
Please review. Looks like he was last seen earlier this month and we switched Xanax to Valium. Thank you. Dr. Alben Spittle patient-aa

## 2015-01-24 NOTE — Telephone Encounter (Signed)
This will have to wait for Dr Rosanna Randy. Really not sure what to do.  Thanks.

## 2015-01-24 NOTE — Telephone Encounter (Signed)
Pt states he has stopped taking the Rx diazepam (VALIUM) 5 MG due to it makes him sleepy and dizzy all the time.  Pt is asking if he can get something different?  [facility]  [place].  [phone]

## 2015-01-25 MED ORDER — CYCLOBENZAPRINE HCL 10 MG PO TABS: 10.0000 mg | ORAL_TABLET | Freq: Three times a day (TID) | ORAL | Status: DC | PRN

## 2015-01-25 NOTE — Telephone Encounter (Signed)
Please see below. Thank you-aa

## 2015-01-25 NOTE — Telephone Encounter (Signed)
LMTCB-Flexeril sent in to the pharmacy-aa

## 2015-01-25 NOTE — Telephone Encounter (Signed)
Stop diazepam/Valium and try cyclobenzaprine 10 mg every 8 hours when necessary spasm/muscle spasm

## 2015-02-28 HISTORY — PX: NASAL SINUS SURGERY: SHX719

## 2015-07-17 ENCOUNTER — Encounter: Payer: Self-pay | Admitting: Family Medicine

## 2015-07-17 ENCOUNTER — Other Ambulatory Visit: Payer: Self-pay

## 2015-07-17 ENCOUNTER — Ambulatory Visit (INDEPENDENT_AMBULATORY_CARE_PROVIDER_SITE_OTHER): Payer: PPO | Admitting: Family Medicine

## 2015-07-17 VITALS — BP 110/72 | HR 68 | Temp 98.2°F | Resp 14 | Wt 221.4 lb

## 2015-07-17 DIAGNOSIS — J0111 Acute recurrent frontal sinusitis: Secondary | ICD-10-CM

## 2015-07-17 DIAGNOSIS — L309 Dermatitis, unspecified: Secondary | ICD-10-CM | POA: Diagnosis not present

## 2015-07-17 MED ORDER — MOMETASONE FUROATE 0.1 % EX OINT: TOPICAL_OINTMENT | Freq: Every day | CUTANEOUS | Status: DC

## 2015-07-17 MED ORDER — AMOXICILLIN-POT CLAVULANATE 875-125 MG PO TABS: 1.0000 | ORAL_TABLET | Freq: Two times a day (BID) | ORAL | Status: DC

## 2015-07-17 NOTE — Progress Notes (Signed)
Patient ID: Randy Dean, male   DOB: 1966/06/05, 50 y.o.   MRN: [id number]   Patient: Randy Dean Male    DOB: 1965/12/14   50 y.o.   MRN: [id number] Visit Date: 07/17/2015  Today's Provider: [name], MD   Chief Complaint  Patient presents with  . Headache  . Dizziness   Subjective:    Headache  This is a new problem. Episode onset: 10 days ago. The problem has been gradually worsening. The pain is located in the frontal, temporal and occipital region. The pain does not radiate. The quality of the pain is described as aching. The pain is at a severity of 5/10 (pain level is sometimes 9/10). Associated symptoms include dizziness.  Dizziness This is a new problem. Episode onset: 10 days ago. The problem has been gradually worsening. Associated symptoms include headaches. Exacerbated by: fast movements.      Previous Medications   BUPROPION (WELLBUTRIN XL) 150 MG 24 HR TABLET    Take by mouth. Reported on 07/17/2015   CYCLOBENZAPRINE (FLEXERIL) 10 MG TABLET    Take 1 tablet (10 mg total) by mouth every 8 (eight) hours as needed for muscle spasms.   DEXLANSOPRAZOLE (DEXILANT) 60 MG CAPSULE    Take 60 mg by mouth daily.   FLUTICASONE (FLONASE) 50 MCG/ACT NASAL SPRAY    Place into the nose.   GABAPENTIN (NEURONTIN) 100 MG CAPSULE    Take 200 mg by mouth 3 (three) times daily. Reported on 07/17/2015   LORATADINE (CLARITIN) 10 MG TABLET    Take 10 mg by mouth daily. Reported on 07/17/2015   MELOXICAM (MOBIC) 15 MG TABLET    Take 15 mg by mouth daily.   MOMETASONE-FORMOTEROL (DULERA) 100-5 MCG/ACT AERO    Inhale 2 puffs into the lungs 2 (two) times daily. Reported on 07/17/2015   PHOSPHATIDYLSERINE-DHA-EPA (VAYARIN) 75-21.5-8.5 MG CAPS    Take 2 capsules by mouth daily. Reported on 07/17/2015   VENLAFAXINE (EFFEXOR) 75 MG TABLET    Take by mouth. Reported on 07/17/2015    Review of Systems  Constitutional: Negative.   Eyes: Negative.   Respiratory: Negative.   Cardiovascular:  Negative.   Gastrointestinal: Negative.   Endocrine: Negative.   Genitourinary: Negative.   Musculoskeletal: Negative.   Skin: Negative.   Allergic/Immunologic: Negative.   Neurological: Positive for dizziness and headaches.  Hematological: Negative.   Psychiatric/Behavioral: Negative.     Social History  Substance Use Topics  . Smoking status: Former Research scientist (life sciences)  . Smokeless tobacco: Former Systems developer    Quit date: 06/29/1986  . Alcohol Use: No   Objective:   There were no vitals taken for this visit.  Physical Exam  Constitutional: He is oriented to person, place, and time. He appears well-developed and well-nourished.  HENT:  Head: Normocephalic and atraumatic.  Right Ear: External ear normal.  Left Ear: External ear normal.  Nose: Nose normal.  Mouth/Throat: Oropharynx is clear and moist.  Tender over frontal sinuses  Eyes: Conjunctivae and EOM are normal. Pupils are equal, round, and reactive to light.  Neck: Normal range of motion. Neck supple.  Cardiovascular: Normal rate, regular rhythm, normal heart sounds and intact distal pulses.   Pulmonary/Chest: Effort normal and breath sounds normal.  Musculoskeletal: Normal range of motion.  Neurological: He is alert and oriented to person, place, and time. He has normal reflexes.  Skin: Skin is warm and dry.  Psychiatric: He has a normal mood and affect. His behavior is normal. Judgment and  thought content normal.        Assessment & Plan:     1. Acute recurrent frontal sinusitis Prescribed Augmentin. Keep follow up appointment with Dr. Ladene Artist at [facility] for next week  2. Eczema: Prescribed Mometasone- apply daily at bedtime with bandaid. Keep hands dry as possible 3. Chronic traumatic brain injury Follow up: Follow up PRN

## 2015-07-23 DIAGNOSIS — H903 Sensorineural hearing loss, bilateral: Secondary | ICD-10-CM | POA: Diagnosis not present

## 2015-07-23 DIAGNOSIS — J301 Allergic rhinitis due to pollen: Secondary | ICD-10-CM | POA: Diagnosis not present

## 2015-07-23 DIAGNOSIS — M950 Acquired deformity of nose: Secondary | ICD-10-CM | POA: Diagnosis not present

## 2015-07-23 DIAGNOSIS — H9319 Tinnitus, unspecified ear: Secondary | ICD-10-CM | POA: Diagnosis not present

## 2015-07-23 DIAGNOSIS — R0981 Nasal congestion: Secondary | ICD-10-CM | POA: Diagnosis not present

## 2015-07-26 DIAGNOSIS — J301 Allergic rhinitis due to pollen: Secondary | ICD-10-CM | POA: Diagnosis not present

## 2015-08-02 DIAGNOSIS — J343 Hypertrophy of nasal turbinates: Secondary | ICD-10-CM | POA: Diagnosis not present

## 2015-08-02 DIAGNOSIS — M950 Acquired deformity of nose: Secondary | ICD-10-CM | POA: Diagnosis not present

## 2015-08-02 DIAGNOSIS — J301 Allergic rhinitis due to pollen: Secondary | ICD-10-CM | POA: Diagnosis not present

## 2015-08-14 ENCOUNTER — Encounter: Payer: Self-pay | Admitting: *Deleted

## 2015-08-16 NOTE — Discharge Instructions (Signed)
[facility] °[facility] °ENDOSCOPIC SINUS SURGERY °[facility] ° °What is Functional Endoscopic Sinus Surgery? ° The Surgery involves making the natural openings of the sinuses larger by removing the bony partitions that separate the sinuses from the nasal cavity.  The natural sinus lining is preserved as much as possible to allow the sinuses to resume normal function after the surgery.  In some patients nasal polyps (excessively swollen lining of the sinuses) may be removed to relieve obstruction of the sinus openings.  The surgery is performed through the nose using lighted scopes, which eliminates the need for incisions on the face.  A septoplasty is a different procedure which is sometimes performed with sinus surgery.  It involves straightening the boy partition that separates the two sides of your nose.  A crooked or deviated septum may need repair if is obstructing the sinuses or nasal airflow.  Turbinate reduction is also often performed during sinus surgery.  The turbinates are bony proturberances from the side walls of the nose which swell and can obstruct the nose in patients with sinus and allergy problems.  Their size can be surgically reduced to help relieve nasal obstruction. ° °What Can Sinus Surgery Do For Me? ° Sinus surgery can reduce the frequency of sinus infections requiring antibiotic treatment.  This can provide improvement in nasal congestion, post-nasal drainage, facial pressure and nasal obstruction.  Surgery will NOT prevent you from ever having an infection again, so it usually only for patients who get infections 4 or more times yearly requiring antibiotics, or for infections that do not clear with antibiotics.  It will not cure nasal allergies, so patients with allergies may still require medication to treat their allergies after surgery. Surgery may improve headaches related to sinusitis, however, some people will continue to  require medication to control sinus headaches related to allergies.  Surgery will do nothing for other forms of headache (migraine, tension or cluster). ° °What Are the Risks of Endoscopic Sinus Surgery? ° Current techniques allow surgery to be performed safely with little risk, however, there are rare complications that patients should be aware of.  Because the sinuses are located around the eyes, there is risk of eye injury, including blindness, though again, this would be quite rare. This is usually a result of bleeding behind the eye during surgery, which puts the vision oat risk, though there are treatments to protect the vision and prevent permanent disrupted by surgery causing a leak of the spinal fluid that surrounds the brain.  More serious complications would include bleeding inside the brain cavity or damage to the brain.  Again, all of these complications are uncommon, and spinal fluid leaks can be safely managed surgically if they occur.  The most common complication of sinus surgery is bleeding from the nose, which may require packing or cauterization of the nose.  Continued sinus have polyps may experience recurrence of the polyps requiring revision surgery.  Alterations of sense of smell or injury to the tear ducts are also rare complications.  ° °What is the Surgery Like, and what is the Recovery? ° The Surgery usually takes a couple of hours to perform, and is usually performed under a general anesthetic (completely asleep).  Patients are usually discharged home after a couple of hours.  Sometimes during surgery it is necessary to pack the nose to control bleeding, and the packing is left in place for 24 - 48 hours, and removed by your surgeon.    If a septoplasty was performed during the procedure, there is often a splint placed which must be removed after 5-7 days.   °Discomfort: Pain is usually mild to moderate, and can be controlled by prescription pain medication or acetaminophen (Tylenol).   Aspirin, Ibuprofen (Advil, Motrin), or Naprosyn (Aleve) should be avoided, as they can cause increased bleeding.  Most patients feel sinus pressure like they have a bad head cold for several days.  Sleeping with your head elevated can help reduce swelling and facial pressure, as can ice packs over the face.  A humidifier may be helpful to keep the mucous and blood from drying in the nose.  ° °Diet: There are no specific diet restrictions, however, you should generally start with clear liquids and a light diet of bland foods because the anesthetic can cause some nausea.  Advance your diet depending on how your stomach feels.  Taking your pain medication with food will often help reduce stomach upset which pain medications can cause. ° °Nasal Saline Irrigation: It is important to remove blood clots and dried mucous from the nose as it is healing.  This is done by having you irrigate the nose at least 3 - 4 times daily with a salt water solution.  We recommend using NeilMed Sinus Rinse (available at the drug store).  Fill the squeeze bottle with the solution, bend over a sink, and insert the tip of the squeeze bottle into the nose ½ of an inch.  Point the tip of the squeeze bottle towards the inside corner of the eye on the same side your irrigating.  Squeeze the bottle and gently irrigate the nose.  If you bend forward as you do this, most of the fluid will flow back out of the nose, instead of down your throat.   The solution should be warm, near body temperature, when you irrigate.   Each time you irrigate, you should use a full squeeze bottle.  ° °Note that if you are instructed to use Nasal Steroid Sprays at any time after your surgery, irrigate with saline BEFORE using the steroid spray, so you do not wash it all out of the nose. °Another product, Nasal Saline Gel (such as AYR Nasal Saline Gel) can be applied in each nostril 3 - 4 times daily to moisture the nose and reduce scabbing or crusting. ° °Bleeding:   Bloody drainage from the nose can be expected for several days, and patients are instructed to irrigate their nose frequently with salt water to help remove mucous and blood clots.  The drainage may be dark red or brown, though some fresh blood may be seen intermittently, especially after irrigation.  Do not blow you nose, as bleeding may occur. If you must sneeze, keep your mouth open to allow air to escape through your mouth. ° °If heavy bleeding occurs: Irrigate the nose with saline to rinse out clots, then spray the nose 3 - 4 times with Afrin Nasal Decongestant Spray.  The spray will constrict the blood vessels to slow bleeding.  Pinch the lower half of your nose shut to apply pressure, and lay down with your head elevated.  Ice packs over the nose may help as well. If bleeding persists despite these measures, you should notify your doctor.  Do not use the Afrin routinely to control nasal congestion after surgery, as it can result in worsening congestion and may affect healing.  ° ° ° °Activity: Return to work varies among patients. Most patients will be   out of work at least 5 - 7 days to recover.  Patient may return to work after they are off of narcotic pain medication, and feeling well enough to perform the functions of their job.  Patients must avoid heavy lifting (over 10 pounds) or strenuous physical for 2 weeks after surgery, so your employer may need to assign you to light duty, or keep you out of work longer if light duty is not possible.  NOTE: you should not drive, operate dangerous machinery, do any mentally demanding tasks or make any important legal or financial decisions while on narcotic pain medication and recovering from the general anesthetic.  °  °Call Your Doctor Immediately if You Have Any of the Following: °1. Bleeding that you cannot control with the above measures °2. Loss of vision, double vision, bulging of the eye or black eyes. °3. Fever over 101 degrees °4. Neck stiffness with  severe headache, fever, nausea and change in mental state. °You are always encourage to call anytime with concerns, however, please call with requests for pain medication refills during office hours. ° °Office Endoscopy: During follow-up visits your doctor will remove any packing or splints that may have been placed and evaluate and clean your sinuses endoscopically.  Topical anesthetic will be used to make this as comfortable as possible, though you may want to take your pain medication prior to the visit.  How often this will need to be done varies from patient to patient.  After complete recovery from the surgery, you may need follow-up endoscopy from time to time, particularly if there is concern of recurrent infection or nasal polyps. ° °General Anesthesia, Adult, Care After °Refer to this sheet in the next few weeks. These instructions provide you with information on caring for yourself after your procedure. Your health care provider may also give you more specific instructions. Your treatment has been planned according to current medical practices, but problems sometimes occur. Call your health care provider if you have any problems or questions after your procedure. °WHAT TO EXPECT AFTER THE PROCEDURE °After the procedure, it is typical to experience: °· Sleepiness. °· Nausea and vomiting. °HOME CARE INSTRUCTIONS °· For the first 24 hours after general anesthesia: °¨ Have a responsible person with you. °¨ Do not drive a car. If you are alone, do not take public transportation. °¨ Do not drink alcohol. °¨ Do not take medicine that has not been prescribed by your health care provider. °¨ Do not sign important papers or make important decisions. °¨ You may resume a normal diet and activities as directed by your health care provider. °· Change bandages (dressings) as directed. °· If you have questions or problems that seem related to general anesthesia, call the hospital and ask for the anesthetist or  anesthesiologist on call. °SEEK MEDICAL CARE IF: °· You have nausea and vomiting that continue the day after anesthesia. °· You develop a rash. °SEEK IMMEDIATE MEDICAL CARE IF:  °· You have difficulty breathing. °· You have chest pain. °· You have any allergic problems. °  °This information is not intended to replace advice given to you by your health care provider. Make sure you discuss any questions you have with your health care provider. °  °Document Released: 09/21/2000 Document Revised: 07/06/2014 Document Reviewed: 10/14/2011 °Elsevier Interactive Patient Education ©2016 [employer] ° °

## 2015-08-20 ENCOUNTER — Other Ambulatory Visit: Payer: Self-pay | Admitting: Family Medicine

## 2015-08-22 ENCOUNTER — Ambulatory Visit: Payer: PPO | Admitting: Anesthesiology

## 2015-08-22 ENCOUNTER — Ambulatory Visit
Admission: RE | Admit: 2015-08-22 | Discharge: 2015-08-22 | Disposition: A | Discharge status: Home / Self Care | Payer: PPO | Source: Ambulatory Visit | Attending: Otolaryngology | Admitting: Otolaryngology

## 2015-08-22 ENCOUNTER — Encounter
Admission: RE | Disposition: A | Discharge status: Home / Self Care | Payer: Self-pay | Source: Ambulatory Visit | Attending: Otolaryngology

## 2015-08-22 DIAGNOSIS — Z87820 Personal history of traumatic brain injury: Secondary | ICD-10-CM | POA: Insufficient documentation

## 2015-08-22 DIAGNOSIS — J3489 Other specified disorders of nose and nasal sinuses: Secondary | ICD-10-CM | POA: Insufficient documentation

## 2015-08-22 DIAGNOSIS — Z87892 Personal history of anaphylaxis: Secondary | ICD-10-CM | POA: Diagnosis not present

## 2015-08-22 DIAGNOSIS — M950 Acquired deformity of nose: Secondary | ICD-10-CM | POA: Insufficient documentation

## 2015-08-22 DIAGNOSIS — Z888 Allergy status to other drugs, medicaments and biological substances status: Secondary | ICD-10-CM | POA: Diagnosis not present

## 2015-08-22 DIAGNOSIS — Z87891 Personal history of nicotine dependence: Secondary | ICD-10-CM | POA: Diagnosis not present

## 2015-08-22 DIAGNOSIS — K219 Gastro-esophageal reflux disease without esophagitis: Secondary | ICD-10-CM | POA: Diagnosis not present

## 2015-08-22 DIAGNOSIS — Z7951 Long term (current) use of inhaled steroids: Secondary | ICD-10-CM | POA: Diagnosis not present

## 2015-08-22 DIAGNOSIS — Z885 Allergy status to narcotic agent status: Secondary | ICD-10-CM | POA: Insufficient documentation

## 2015-08-22 DIAGNOSIS — J343 Hypertrophy of nasal turbinates: Secondary | ICD-10-CM | POA: Diagnosis not present

## 2015-08-22 DIAGNOSIS — Z79899 Other long term (current) drug therapy: Secondary | ICD-10-CM | POA: Insufficient documentation

## 2015-08-22 DIAGNOSIS — Z8249 Family history of ischemic heart disease and other diseases of the circulatory system: Secondary | ICD-10-CM | POA: Insufficient documentation

## 2015-08-22 DIAGNOSIS — Z833 Family history of diabetes mellitus: Secondary | ICD-10-CM | POA: Insufficient documentation

## 2015-08-22 HISTORY — DX: Weakness: R53.1

## 2015-08-22 HISTORY — PX: ENDOSCOPIC TURBINATE REDUCTION: SHX6489

## 2015-08-22 HISTORY — DX: Gastro-esophageal reflux disease without esophagitis: K21.9

## 2015-08-22 HISTORY — DX: Cerebral infarction, unspecified: I63.9

## 2015-08-22 HISTORY — DX: Prediabetes: R73.03

## 2015-08-22 HISTORY — DX: Torticollis: M43.6

## 2015-08-22 HISTORY — DX: Unspecified injury of head, initial encounter: S09.90XA

## 2015-08-22 SURGERY — NASAL VALVE REPAIR WITH NASAL IMPLANT
Anesthesia: General | Laterality: Bilateral

## 2015-08-22 MED ORDER — DEXAMETHASONE SODIUM PHOSPHATE 4 MG/ML IJ SOLN
INTRAMUSCULAR | Status: DC | PRN
  Administered 2015-08-22: 10 mg via INTRAVENOUS

## 2015-08-22 MED ORDER — DEXTROSE 5 % IV SOLN
1000.0000 mg | Freq: Once | INTRAVENOUS | Status: AC
  Administered 2015-08-22: 1000 mg via INTRAVENOUS

## 2015-08-22 MED ORDER — PROPOFOL 10 MG/ML IV BOLUS
INTRAVENOUS | Status: DC | PRN
  Administered 2015-08-22: 150 mg via INTRAVENOUS

## 2015-08-22 MED ORDER — FENTANYL CITRATE (PF) 100 MCG/2ML IJ SOLN
INTRAMUSCULAR | Status: DC | PRN
  Administered 2015-08-22 (×2): 25 ug via INTRAVENOUS
  Administered 2015-08-22: 50 ug via INTRAVENOUS

## 2015-08-22 MED ORDER — FENTANYL CITRATE (PF) 100 MCG/2ML IJ SOLN: 25.0000 ug | INTRAMUSCULAR | Status: DC | PRN

## 2015-08-22 MED ORDER — GLYCOPYRROLATE 0.2 MG/ML IJ SOLN
INTRAMUSCULAR | Status: DC | PRN
  Administered 2015-08-22: 0.1 mg via INTRAVENOUS

## 2015-08-22 MED ORDER — SUCCINYLCHOLINE CHLORIDE 20 MG/ML IJ SOLN
INTRAMUSCULAR | Status: DC | PRN
  Administered 2015-08-22: 100 mg via INTRAVENOUS

## 2015-08-22 MED ORDER — LIDOCAINE-EPINEPHRINE 1 %-1:100000 IJ SOLN
INTRAMUSCULAR | Status: DC | PRN
  Administered 2015-08-22: 2.5 mL

## 2015-08-22 MED ORDER — PHENYLEPHRINE HCL 0.5 % NA SOLN
NASAL | Status: DC | PRN
  Administered 2015-08-22: 30 mL via TOPICAL

## 2015-08-22 MED ORDER — PROMETHAZINE HCL 25 MG/ML IJ SOLN: 6.2500 mg | INTRAMUSCULAR | Status: DC | PRN

## 2015-08-22 MED ORDER — OXYMETAZOLINE HCL 0.05 % NA SOLN
2.0000 | Freq: Once | NASAL | Status: AC
  Administered 2015-08-22: 2 via NASAL

## 2015-08-22 MED ORDER — LIDOCAINE HCL (CARDIAC) 20 MG/ML IV SOLN
INTRAVENOUS | Status: DC | PRN
  Administered 2015-08-22: 50 mg via INTRAVENOUS

## 2015-08-22 MED ORDER — OXYCODONE HCL 5 MG PO TABS: 5.0000 mg | ORAL_TABLET | Freq: Once | ORAL | Status: DC | PRN

## 2015-08-22 MED ORDER — ONDANSETRON HCL 4 MG/2ML IJ SOLN
INTRAMUSCULAR | Status: DC | PRN
  Administered 2015-08-22: 4 mg via INTRAVENOUS

## 2015-08-22 MED ORDER — OXYCODONE HCL 5 MG/5ML PO SOLN: 5.0000 mg | Freq: Once | ORAL | Status: DC | PRN

## 2015-08-22 MED ORDER — MIDAZOLAM HCL 5 MG/5ML IJ SOLN
INTRAMUSCULAR | Status: DC | PRN
  Administered 2015-08-22: 2 mg via INTRAVENOUS

## 2015-08-22 MED ORDER — LACTATED RINGERS IV SOLN
INTRAVENOUS | Status: DC | PRN
  Administered 2015-08-22: 08:00:00 via INTRAVENOUS

## 2015-08-22 SURGICAL SUPPLY — 36 items
CANISTER SUCT 1200ML W/VALVE (MISCELLANEOUS) ×2 IMPLANT
CATH IV 18X1 1/4 SAFELET (CATHETERS) ×2 IMPLANT
COAG SUCT 10F 3.5MM HAND CTRL (MISCELLANEOUS) ×2 IMPLANT
COAGULATOR SUCT 8FR VV (MISCELLANEOUS) IMPLANT
DEVICE INFLATION 20/61 (MISCELLANEOUS) IMPLANT
DRAPE HEAD BAR (DRAPES) ×2 IMPLANT
DRESSING NASL FOAM PST OP SINU (MISCELLANEOUS) IMPLANT
DRSG NASAL 4CM NASOPORE (MISCELLANEOUS) IMPLANT
DRSG NASAL FOAM POST OP SINU (MISCELLANEOUS)
GLOVE PI ULTRA LF STRL 7.5 (GLOVE) ×2 IMPLANT
GLOVE PI ULTRA NON LATEX 7.5 (GLOVE) ×2
IMPLANT NASAL LATERA ABSORABLE (Miscellaneous) ×2 IMPLANT
IRRIGATOR 4MM STR (IRRIGATION / IRRIGATOR) ×1 IMPLANT
IV CATH 18X1 1/4 SAFELET (CATHETERS) ×1
IV NS 500ML (IV SOLUTION) ×2
IV NS 500ML BAXH (IV SOLUTION) ×1 IMPLANT
KIT ROOM TURNOVER OR (KITS) ×2 IMPLANT
NDL HYPO 25GX1X1/2 BEV (NEEDLE) ×1 IMPLANT
NDL SPNL 25GX3.5 QUINCKE BL (NEEDLE) IMPLANT
NEEDLE HYPO 25GX1X1/2 BEV (NEEDLE) ×2 IMPLANT
NEEDLE SPNL 25GX3.5 QUINCKE BL (NEEDLE) IMPLANT
NS IRRIG 500ML POUR BTL (IV SOLUTION) ×2 IMPLANT
PACK DRAPE NASAL/ENT (PACKS) ×2 IMPLANT
PACKING NASAL EPIS 4X2.4 XEROG (MISCELLANEOUS) IMPLANT
PAD GROUND ADULT SPLIT (MISCELLANEOUS) ×2 IMPLANT
PATTIES SURGICAL .5 X3 (DISPOSABLE) ×2 IMPLANT
SET HANDPIECE IRR DIEGO (MISCELLANEOUS) ×1 IMPLANT
SINUPLASTY BALLN CATHTIP (CATHETERS) IMPLANT
SOL ANTI-FOG 6CC FOG-OUT (MISCELLANEOUS) ×1 IMPLANT
SOL FOG-OUT ANTI-FOG 6CC (MISCELLANEOUS) ×1
STRAP BODY AND KNEE 60X3 (MISCELLANEOUS) ×2 IMPLANT
SUT CHROMIC 5 0 P 3 (SUTURE) ×1 IMPLANT
SYR 3ML LL SCALE MARK (SYRINGE) ×2 IMPLANT
SYSTEM BALLN SINUPLASTY 6X16 (BALLOONS) IMPLANT
TOWEL OR 17X26 4PK STRL BLUE (TOWEL DISPOSABLE) ×2 IMPLANT
WATER STERILE IRR 500ML POUR (IV SOLUTION) IMPLANT

## 2015-08-22 NOTE — Transfer of Care (Signed)
Immediate Anesthesia Transfer of Care Note  Patient: Randy Dean  Procedure(s) Performed: Procedure(s): NASAL VALVE REPAIR WITH NASAL IMPLANT (Bilateral) ENDOSCOPIC TURBINATE REDUCTION (Bilateral)  Patient Location: PACU  Anesthesia Type: General  Level of Consciousness: awake, alert  and patient cooperative  Airway and Oxygen Therapy: Patient Spontanous Breathing and Patient connected to supplemental oxygen  Post-op Assessment: Post-op Vital signs reviewed, Patient's Cardiovascular Status Stable, Respiratory Function Stable, Patent Airway and No signs of Nausea or vomiting  Post-op Vital Signs: Reviewed and stable  Complications: No apparent anesthesia complications

## 2015-08-22 NOTE — Anesthesia Postprocedure Evaluation (Signed)
Anesthesia Post Note  Patient: Randy Dean  Procedure(s) Performed: Procedure(s) (LRB): NASAL VALVE REPAIR WITH NASAL IMPLANT (Bilateral) ENDOSCOPIC TURBINATE REDUCTION (Bilateral)  Patient location during evaluation: PACU Anesthesia Type: General Level of consciousness: awake and alert Pain management: pain level controlled Vital Signs Assessment: post-procedure vital signs reviewed and stable Respiratory status: spontaneous breathing, nonlabored ventilation, respiratory function stable and patient connected to nasal cannula oxygen Cardiovascular status: blood pressure returned to baseline and stable Postop Assessment: no signs of nausea or vomiting Anesthetic complications: no    Redonna Wilbert C

## 2015-08-22 NOTE — Op Note (Signed)
08/22/2015  8:58 AM    Randy Dean  [id number]   Pre-Op Dx:  Nasal vestibular stenosis, bilateral.  Middle turbinate hypertrophy and scarring  Post-op Dx: Same  Proc: Bilateral nasal vestibular stenosis repair, bilateral endoscopic middle turbinate reduction   Surg:  Randy Dean  Anes:  GOT  EBL:  20 mL  Comp:  None  Findings:  There was laxity of the upper lateral cartilages and collapsed nasal valve bilaterally. The lower lateral cartilage on the right was buckled inferiorly and pushing into the nasal airway as well.  Procedure: The patient was given general anesthesia by oral endotracheal intubation. The 0 endoscope was used for visualizing the inside nose on both sides. It'll turbinates were medialized and sutured to the septum and preventing airflow into the middle cleft. The septum was previously operated on and relatively straight. The ethmoid sinuses were previously opened along with maxillary antrostomies and sphenoidotomies. A good portion of the superior turbinate had been removed on both sides which may be impacting the amount of smell that the patient has.  The repair of the turbinates was done first using the 0 scope. Incision was made on the left side medial to the middle turbinate where it had been sutured to the septum. This allowed me to mobilize the middle turbinate laterally to visualize the medial cleft. There was a large opening into the sphenoid sinus and over half of the superior turbinate had been previously removed. A portion of the middle turbinate was then trimmed to allow better airflow on both sides of the middle turbinate. The 0 scope was used to visualize the right side and the procedure was repeated. It was freed up from the septum and mobilized laterally some to get into the middle cleft. This showed that the superior turbinate had been mostly removed as well making the sphenoid sinus opening larger. This likely has an impact on smell as well the  middle turbinate was trimmed slightly along its anterior border to create a better airflow on both sides. He was minimal ooze and no gel was placed in the area as it did not appear to need it. There was good opening into the sinuses on both sides and no sign of any sinus disease.  The repair of nasal vestibular stenosis was then performed. On the right side the lower lateral cartilage showed buckling of the lateral distal crura into the nasal airway. Was causing some obstruction here. A small incision was created inside the right nasal vestibule to visualize the lateral border of the lower lateral cartilage. It was a knuckle of the cartilage here that was trimmed to allow this to lie more flat and open up the distal airway. A 5-0 chromic was used for closure of this wound.  Markings were then created to visualize the area of the nasal valve. The junction of the upper lateral cartilage and nasal bone and maxilla was outlined on both sides Was drawn was on middle to the nasal dorsum out one third of the way down the right side of the nose to show the line of dissection for the placement of the implant. The distal end of this was marked overlying the nasal bone and the proximal and was marked at the junction of the upper and lower lateral cartilages. The Palmer implant was loaded into the sterile delivery device. An incision was made along the anterior alar rim and the cannula entered here. Dissection plane was created over the lower lateral and upper lateral cartilages and  extending to the nasal bone. The dissection was carried over the nasal bone and periosteum. Once the dissection reached its marked area of distal end of dissection, the Palmer implant was then deployed into the soft tissue overlying the nasal bone. The dissection cannula was then slowly removed make sure the polymer implant remained in the correct position. There is minimal bleeding. The implant can be palpated and it is sitting in a good  position and stiffening up the upper lateral cartilage. This procedure was then performed in the same fashion on the left side of the nose, first marking the nose and then making an incision at the alar rim, and then developing the dissection plane and deploying the polymer above the periosteum of the nasal bone and overlying the upper lateral cartilage. The implants were palpated and in a symmetrical location. There was good stiffening of the upper lateral cartilage to help hold the nasal valves open.  The 0 scope was used to visualize the nose again to make sure there is no significant bleeding. There is good airway around the middle turbinates on both sides. The patient tolerated the procedure well. He was awakened taken to the recovery room in satisfactory condition.  Dispo:   To PACU to be discharged home  Plan:  To rest at home with head elevated. To follow-up in the office in 1 week. We'll use prednisone help keep swelling to a minimum and antibiotics for prevention. Call if problems  Randy Dean  08/22/2015 8:58 AM

## 2015-08-22 NOTE — Anesthesia Procedure Notes (Signed)
Procedure Name: Intubation Date/Time: 08/22/2015 8:13 AM Performed by: Mayme Genta Pre-anesthesia Checklist: Patient identified, Emergency Drugs available, Suction available, Patient being monitored and Timeout performed Patient Re-evaluated:Patient Re-evaluated prior to inductionOxygen Delivery Method: Circle system utilized Preoxygenation: Pre-oxygenation with 100% oxygen Intubation Type: IV induction Ventilation: Mask ventilation without difficulty Laryngoscope Size: Miller and 3 Grade View: Grade I Tube type: Oral Rae Tube size: 7.5 mm Number of attempts: 1 Placement Confirmation: ETT inserted through vocal cords under direct vision,  positive ETCO2 and breath sounds checked- equal and bilateral Tube secured with: Tape Dental Injury: Teeth and Oropharynx as per pre-operative assessment

## 2015-08-22 NOTE — Anesthesia Preprocedure Evaluation (Addendum)
Anesthesia Evaluation  Patient identified by MRN, date of birth, ID band Patient awake    Reviewed: Allergy & Precautions, NPO status , Patient's Chart, lab work & pertinent test results  Airway Mallampati: II  TM Distance: >3 FB Neck ROM: Full    Dental no notable dental hx.    Pulmonary neg pulmonary ROS, shortness of breath, former smoker,    Pulmonary exam normal breath sounds clear to auscultation       Cardiovascular negative cardio ROS Normal cardiovascular exam Rhythm:Regular Rate:Normal     Neuro/Psych Anxiety Depression CVA negative neurological ROS  negative psych ROS   GI/Hepatic negative GI ROS, Neg liver ROS, GERD  Controlled,  Endo/Other  negative endocrine ROS  Renal/GU negative Renal ROS  negative genitourinary   Musculoskeletal negative musculoskeletal ROS (+)   Abdominal   Peds negative pediatric ROS (+)  Hematology negative hematology ROS (+)   Anesthesia Other Findings   Reproductive/Obstetrics negative OB ROS                            Anesthesia Physical Anesthesia Plan  ASA: II  Anesthesia Plan: General   Post-op Pain Management:    Induction: Intravenous  Airway Management Planned: Oral ETT  Additional Equipment:   Intra-op Plan:   Post-operative Plan: Extubation in OR  Informed Consent: I have reviewed the patients History and Physical, chart, labs and discussed the procedure including the risks, benefits and alternatives for the proposed anesthesia with the patient or authorized representative who has indicated his/her understanding and acceptance.   Dental advisory given  Plan Discussed with: CRNA  Anesthesia Plan Comments:         Anesthesia Quick Evaluation

## 2015-08-22 NOTE — H&P (Signed)
  H&P has been reviewed and no changes necessary. To be downloaded later. 

## 2015-08-23 ENCOUNTER — Encounter: Payer: Self-pay | Admitting: Otolaryngology

## 2015-09-02 DIAGNOSIS — J329 Chronic sinusitis, unspecified: Secondary | ICD-10-CM | POA: Diagnosis not present

## 2015-09-10 DIAGNOSIS — R0981 Nasal congestion: Secondary | ICD-10-CM | POA: Diagnosis not present

## 2015-09-12 ENCOUNTER — Ambulatory Visit: Payer: PPO | Admitting: Family Medicine

## 2015-09-25 ENCOUNTER — Ambulatory Visit (INDEPENDENT_AMBULATORY_CARE_PROVIDER_SITE_OTHER): Payer: PPO | Admitting: Family Medicine

## 2015-09-25 ENCOUNTER — Encounter: Payer: Self-pay | Admitting: Family Medicine

## 2015-09-25 VITALS — BP 108/68 | HR 68 | Temp 98.6°F | Resp 16 | Ht 71.0 in | Wt 219.0 lb

## 2015-09-25 DIAGNOSIS — J329 Chronic sinusitis, unspecified: Secondary | ICD-10-CM

## 2015-09-25 DIAGNOSIS — S069X0D Unspecified intracranial injury without loss of consciousness, subsequent encounter: Secondary | ICD-10-CM | POA: Diagnosis not present

## 2015-09-25 NOTE — Progress Notes (Signed)
Patient ID: Randy Dean, male   DOB: 09/30/1965, 50 y.o.   MRN: [id number]       Patient: Randy Dean Male    DOB: July 09, 1965   50 y.o.   MRN: [id number] Visit Date: 09/25/2015  Today's Provider: [name], MD   Chief Complaint  Patient presents with  . Sinusitis    2 month F/U.   . right sided weakness   Subjective:    HPI Sinusitis Patient is here to F/U on chronic sinusitis. He had sinus surgery on 08/22/15 and reports that he tolerated the procedure well. He reports that he still has sinus headaches. He feels as if the weather change and increased pollen has excerebration his symptoms. He reports that he still takes a daily antihistamine and Flonase to help with symptoms.  Right sided weakness Patient mentions that he has had weakness in his entire right side that has worsened in the past 2-3 weeks. Patient reports that he is starting to notice his right leg dragging when he walks. Patient reports that he has not fallen, but on several occasions he feels off balance. Patient also mentions that he had a brain injury in the past, but doesn't think this is related to his brain injury.      Allergies  Allergen Reactions  . Serzone [Nefazodone]     Tongue and facial swelling  . Codeine Nausea Only    Dizziness, sweating   Previous Medications   ACETAMINOPHEN (TYLENOL) 500 MG TABLET    Take 500 mg by mouth every 6 (six) hours as needed.   DEXILANT 60 MG CAPSULE    take 1 capsule by mouth once daily   FLUTICASONE (FLONASE) 50 MCG/ACT NASAL SPRAY    Place into the nose.   FORSKOLIN POWD    by Does not apply route daily. Reported on 09/25/2015   MECLIZINE (ANTIVERT) 25 MG TABLET    Take 25 mg by mouth as needed for dizziness. (rarely uses)   MELOXICAM (MOBIC) 15 MG TABLET    Take 15 mg by mouth daily. Reported on 09/25/2015   MOMETASONE (ELOCON) 0.1 % OINTMENT    Apply topically at bedtime.   MOMETASONE-FORMOTEROL (DULERA) 100-5 MCG/ACT AERO    Inhale 2 puffs into  the lungs 2 (two) times daily. Reported on 09/25/2015   MULTIPLE VITAMIN (MULTIVITAMIN) CAPSULE    Take 1 capsule by mouth daily. Reported on 09/25/2015   PHOSPHATIDYLSERINE-DHA-EPA (VAYARIN) 75-21.5-8.5 MG CAPS    Take 2 capsules by mouth daily. Reported on 08/14/2015   VENLAFAXINE (EFFEXOR) 75 MG TABLET    Take by mouth. Reported on 08/14/2015    Review of Systems  HENT: Positive for congestion, postnasal drip, sinus pressure and sneezing.   Respiratory: Negative.   Cardiovascular: Negative.   Musculoskeletal: Positive for gait problem.  Neurological: Positive for weakness. Negative for tremors and numbness.    Social History  Substance Use Topics  . Smoking status: Former Research scientist (life sciences)  . Smokeless tobacco: Current User    Last Attempt to Quit: 06/29/1986     Comment: 1 can dip/day  . Alcohol Use: No   Objective:   BP 108/68 mmHg  Pulse 68  Temp(Src) 98.6 F (37 C)  Resp 16  Ht 5\' 11"  (1.803 m)  Wt 219 lb (99.338 kg)  BMI 30.56 kg/m2  Physical Exam  Constitutional: He is oriented to person, place, and time. He appears well-developed and well-nourished.  HENT:  Head: Normocephalic and atraumatic.  Right Ear: External ear  normal.  Left Ear: External ear normal.  Nose: Nose normal.  Eyes: Conjunctivae and EOM are normal. Pupils are equal, round, and reactive to light.  Neck: Normal range of motion. Neck supple.  Cardiovascular: Normal rate, regular rhythm and normal heart sounds.   Pulmonary/Chest: Effort normal and breath sounds normal.  Abdominal: Soft.  Neurological: He is alert and oriented to person, place, and time.  Decreased strength in biceps and triceps in right arm. Tremor in right arm. Decreased strength in right distal extremity with decreased dorsiflexion and plantar flexion  Skin: Skin is warm and dry.  some difficulty with speech, dysarthria,this is worse when he is tired.      Assessment & Plan:     1. TBI (traumatic brain injury), without loss of  consciousness, subsequent encounter For year Vyarin  helped patient.restart a less expensive over-the-counter Fish oil and phosphatidylserine - Ambulatory referral to Neurology  2. Chronic sinusitis, unspecified location Treatment with Augmentin for 10 days.      I have done the exam and reviewed the above chart and it is accurate to the best of my knowledge.  Richard Cranford Mon, MD  [facility]

## 2015-10-15 ENCOUNTER — Ambulatory Visit: Payer: PPO | Admitting: Neurology

## 2015-10-30 ENCOUNTER — Ambulatory Visit (INDEPENDENT_AMBULATORY_CARE_PROVIDER_SITE_OTHER): Payer: PPO | Admitting: Neurology

## 2015-10-30 ENCOUNTER — Encounter: Payer: Self-pay | Admitting: Neurology

## 2015-10-30 VITALS — BP 116/68 | HR 72 | Ht 71.0 in | Wt 217.5 lb

## 2015-10-30 DIAGNOSIS — R269 Unspecified abnormalities of gait and mobility: Secondary | ICD-10-CM | POA: Insufficient documentation

## 2015-10-30 DIAGNOSIS — G441 Vascular headache, not elsewhere classified: Secondary | ICD-10-CM | POA: Diagnosis not present

## 2015-10-30 DIAGNOSIS — G4486 Cervicogenic headache: Secondary | ICD-10-CM | POA: Insufficient documentation

## 2015-10-30 DIAGNOSIS — R251 Tremor, unspecified: Secondary | ICD-10-CM

## 2015-10-30 DIAGNOSIS — R51 Headache: Secondary | ICD-10-CM

## 2015-10-30 DIAGNOSIS — S069X0S Unspecified intracranial injury without loss of consciousness, sequela: Secondary | ICD-10-CM

## 2015-10-30 DIAGNOSIS — R519 Headache, unspecified: Secondary | ICD-10-CM

## 2015-10-30 HISTORY — DX: Unspecified abnormalities of gait and mobility: R26.9

## 2015-10-30 HISTORY — DX: Headache, unspecified: R51.9

## 2015-10-30 MED ORDER — ALPRAZOLAM 0.5 MG PO TABS: ORAL_TABLET | ORAL | Status: DC

## 2015-10-30 MED ORDER — TOPIRAMATE 25 MG PO TABS: ORAL_TABLET | ORAL | Status: DC

## 2015-10-30 NOTE — Progress Notes (Signed)
Reason for visit: Gait disorder  Referring physician: Dr. Posey Boyer is a 50 y.o. male  History of present illness:  [name] is a 50 year old right-handed white male with a history of cervical spine disease, the patient indicates that he had spinal cord compression in 1996 when he had surgical decompression with fusion from C4-C7 done by Dr. Carloyn Manner. The patient claims that he was at work in 2010, climbing a ladder when a steel rod fell off of a roof and struck him on the top of the head. The patient indicates that he did not lose consciousness, he did not fall from the ladder. He was able to wrap his belt around the latter to keep him stable. Coworkers helped him off the ladder. Since that time, he has had problems with walking, difficulty using the right arm, tremor involving the right arm, and some right-sided numbness including the face, arm, and leg. The patient indicates that he has been at his baseline until about one year ago when he began having some increased problems with mentation and confusion. He has had some urinary urgency and some constipation problems over the last year. Within the last 6 months he has had some changes in his walking, he feels as if he is dragging his right leg, he has fallen on occasion. He continues to have a tremor of the right arm that will come and go. He believes that his ability to use the arm has declined slightly over the last several months. The patient also reports onset of headache over the last one month that is now occurring on average 3 times a day involving the right temporal area. He also reports some low back pain over the last one year. With the headaches, the patient may report some nausea, but no vomiting. The patient has had increasing problems with word finding, he denies any visual complaints. He is sent to this office for further evaluation.  Past Medical History  Diagnosis Date  . Allergic rhinitis   . Vertigo   . Chronic  headaches     partially sinus/partially brain injury (per pt)  . Head trauma 2010    has caused anxiety, memory issues and neck problems  . Neck stiffness     metal plate.  mild limitations of side to side and up and dowm movement  . Stroke [facility])     TIA x3- some residual right side weakness  . Weakness of right side of body     from TIAs  . Prediabetes   . GERD (gastroesophageal reflux disease)   . Abnormality of gait 10/30/2015  . Headache 10/30/2015    Right temporal     Past Surgical History  Procedure Laterality Date  . Neck surgery    . Back surgery    . Eye muscle surgery Left     Due to conential left amblyopia  . Nasal sinus surgery  september 2016  . Endoscopic turbinate reduction Bilateral 08/22/2015    Procedure: ENDOSCOPIC TURBINATE REDUCTION;  Surgeon: Margaretha Sheffield, MD;  Location: [facility];  Service: ENT;  Laterality: Bilateral;    Family History  Problem Relation Age of Onset  . Fibromyalgia Mother   . Heart disease Father   . Tremor Father   . Diabetes Maternal Grandmother   . Hypertension Maternal Grandmother   . Hypertension Maternal Grandfather   . Diabetes Paternal Grandmother   . Hypertension Paternal Grandmother   . Hypertension Paternal Grandfather  Social history:  reports that he has quit smoking. He uses smokeless tobacco. He reports that he does not drink alcohol or use illicit drugs.  Medications:  Prior to Admission medications   Medication Sig Start Date End Date Taking? Authorizing Provider  acetaminophen (TYLENOL) 500 MG tablet Take 500 mg by mouth every 6 (six) hours as needed.   Yes Historical Provider, MD  DEXILANT 60 MG capsule take 1 capsule by mouth once daily 08/20/15  Yes Richard L Cranford Mon., MD  fluticasone [facility]) 50 MCG/ACT nasal spray Place into the nose. 11/05/14  Yes Historical Provider, MD  Forskolin POWD by Does not apply route daily. Reported on 09/25/2015   Yes Historical Provider, MD  meclizine (ANTIVERT) 25  MG tablet Take 25 mg by mouth as needed for dizziness. (rarely uses)   Yes Historical Provider, MD  meloxicam (MOBIC) 15 MG tablet Take 15 mg by mouth daily. Reported on 09/25/2015   Yes Historical Provider, MD  mometasone (ELOCON) 0.1 % ointment Apply topically at bedtime. 07/17/15  Yes Richard Maceo Pro., MD  Multiple Vitamin (MULTIVITAMIN) capsule Take 1 capsule by mouth daily. Reported on 09/25/2015   Yes Historical Provider, MD  Phosphatidylserine-DHA-EPA (VAYARIN) 75-21.5-8.5 MG CAPS Take 2 capsules by mouth daily. Reported on 08/14/2015   Yes Historical Provider, MD  venlafaxine (EFFEXOR) 75 MG tablet Take by mouth. Reported on 08/14/2015 11/07/14  Yes Historical Provider, MD      Allergies  Allergen Reactions  . Serzone [Nefazodone]     Tongue and facial swelling  . Codeine Nausea Only    Dizziness, sweating    ROS:  Out of a complete 14 system review of symptoms, the patient complains only of the following symptoms, and all other reviewed systems are negative.  Fatigue Ringing in the ears Shortness of breath, cough Constipation Enlarged lymph nodes Increased thirst Joint pain, muscle cramps, aching muscles Allergies, frequent infection Tremor  Blood pressure 116/68, pulse 72, height 5\' 11"  (1.803 m), weight 217 lb 8 oz (98.657 kg).  Physical Exam  General: The patient is alert and cooperative at the time of the examination.  Eyes: Pupils are equal, round, and reactive to light. Discs are flat bilaterally.  Neck: The neck is supple, no carotid bruits are noted.  Respiratory: The respiratory examination is clear.  Cardiovascular: The cardiovascular examination reveals a regular rate and rhythm, no obvious murmurs or rubs are noted.  Skin: Extremities are without significant edema.  Neurologic Exam  Mental status: The patient is alert and oriented x 3 at the time of the examination. The patient has apparent normal recent and remote memory, with an apparently normal  attention span and concentration ability.  Cranial nerves: Facial symmetry is present. There is good sensation of the face to pinprick and soft touch on the left face, decreased on the right. The patient splits the midline with vibration sensation on the forehead, decreased on the right. The strength of the facial muscles and the muscles to head turning and shoulder shrug are normal bilaterally. Speech is well enunciated, no aphasia or dysarthria is noted. Extraocular movements are full, with exception of incomplete medial deviation of the left eye. On primary gaze, there is exotropia of the left eye. Visual fields are full. The tongue is midline, and the patient has symmetric elevation of the soft palate. No obvious hearing deficits are noted.  Motor: The motor testing reveals 5 over 5 strength of all 4 extremities. Good symmetric motor tone is noted throughout.  Sensory: Sensory testing is notable for a decrease in pinprick sensation on the right arm and left leg as compared to the other side. Vibration sensation is symmetric throughout, position sensation is decreased on the left foot, normal on the right and normal on the arms bilaterally. No evidence of extinction is noted.  Coordination: Cerebellar testing reveals good finger-nose-finger and heel-to-shin bilaterally. There appears to be an intermittent tremor affecting the right arm, the tremor may completed disappeared times during the clinical examination.  Gait and station: Gait is slightly wide-based, with ambulation there is decreased arm swing with the right arm, the right arm is in flexion. Tandem gait is slightly unsteady, the patient is still able to perform. Romberg is negative, no drift is seen.  Reflexes: Deep tendon reflexes are symmetric and normal bilaterally, with exception that the right ankle jerk reflexes slightly depressed relative to the left. Toes are downgoing bilaterally.   Assessment/Plan:  1. History of closed head  injury  2. History of cervical spondylosis, cervical fusion  3. Right upper extremity tremor  4. Reported gait disturbance, progressive  5. Right temporal headache, new onset  6. Reported cognitive dysfunction  7. Nonorganic features to neurologic examination  The clinical history is somewhat unusual and does not correlate with the clinical examination. The patient reports a closed head injury in 2010, by his own recollection of the accident the patient did not lose consciousness, and did not even fall off the ladder when he was hit on the head. This suggests a very low energy impact, not likely to produce severe neurologic injury to the brain and result in severe functional alterations as presented by this patient. The clinical examination does show nonorganic features. The patient has a tremor that seems to be distractible, and it will come and go during the examination. The patient reports a change in cognition, and an alteration in his ability to ambulate. He also reports new onset headaches in the right temporal area. We will check MRI of the brain and cervical spine, blood work will be done today. A prescription was given for Topamax for the headache, he will follow-up in 4 months.   Jill Alexanders MD 10/30/2015 8:34 PM  [facility] [address] [place], [place] [address]  Phone [phone] Fax [phone]

## 2015-10-31 ENCOUNTER — Telehealth: Payer: Self-pay

## 2015-10-31 LAB — RPR: RPR Ser Ql: NONREACTIVE

## 2015-10-31 LAB — HIV ANTIBODY (ROUTINE TESTING W REFLEX): HIV Screen 4th Generation wRfx: NONREACTIVE

## 2015-10-31 LAB — SEDIMENTATION RATE: Sed Rate: 3 mm/hr (ref 0–30)

## 2015-10-31 LAB — TSH: TSH: 1.12 u[IU]/mL (ref 0.450–4.500)

## 2015-10-31 LAB — B. BURGDORFI ANTIBODIES: Lyme IgG/IgM Ab: <0.91 {ISR} (ref 0.00–0.90)

## 2015-10-31 LAB — VITAMIN B12: Vitamin B-12: 376 pg/mL (ref 211–946)

## 2015-10-31 NOTE — Telephone Encounter (Signed)
-----   Message from Kathrynn Ducking, MD sent at 10/31/2015  2:20 PM EDT -----  The blood work results are unremarkable. Please call the patient.  ----- Message -----    From: [facility] Lab Results In Interface    Sent: 10/31/2015   7:42 AM      To: Kathrynn Ducking, MD

## 2015-10-31 NOTE — Telephone Encounter (Signed)
Called pt w/ unremarkable lab results. Verbalized understanding and appreciation for call. 

## 2015-11-07 ENCOUNTER — Other Ambulatory Visit: Payer: PPO

## 2015-11-07 ENCOUNTER — Inpatient Hospital Stay: Admission: RE | Admit: 2015-11-07 | Payer: PPO | Source: Ambulatory Visit

## 2015-12-11 ENCOUNTER — Other Ambulatory Visit: Payer: PPO

## 2015-12-23 ENCOUNTER — Ambulatory Visit: Payer: PPO | Admitting: Family Medicine

## 2015-12-26 ENCOUNTER — Emergency Department: Payer: PPO

## 2015-12-26 ENCOUNTER — Emergency Department
Admission: EM | Admit: 2015-12-26 | Discharge: 2015-12-26 | Disposition: A | Discharge status: Home / Self Care | Payer: PPO | Attending: Emergency Medicine | Admitting: Emergency Medicine

## 2015-12-26 DIAGNOSIS — F410 Panic disorder [episodic paroxysmal anxiety] without agoraphobia: Secondary | ICD-10-CM | POA: Diagnosis not present

## 2015-12-26 DIAGNOSIS — Z87891 Personal history of nicotine dependence: Secondary | ICD-10-CM | POA: Insufficient documentation

## 2015-12-26 DIAGNOSIS — R0602 Shortness of breath: Secondary | ICD-10-CM

## 2015-12-26 DIAGNOSIS — F329 Major depressive disorder, single episode, unspecified: Secondary | ICD-10-CM | POA: Diagnosis not present

## 2015-12-26 DIAGNOSIS — F419 Anxiety disorder, unspecified: Secondary | ICD-10-CM | POA: Insufficient documentation

## 2015-12-26 DIAGNOSIS — Z8673 Personal history of transient ischemic attack (TIA), and cerebral infarction without residual deficits: Secondary | ICD-10-CM | POA: Insufficient documentation

## 2015-12-26 DIAGNOSIS — R079 Chest pain, unspecified: Secondary | ICD-10-CM

## 2015-12-26 DIAGNOSIS — E785 Hyperlipidemia, unspecified: Secondary | ICD-10-CM | POA: Insufficient documentation

## 2015-12-26 DIAGNOSIS — R4702 Dysphasia: Secondary | ICD-10-CM | POA: Diagnosis not present

## 2015-12-26 LAB — TROPONIN I
Troponin I: 0.04 ng/mL (ref <0.03)
Troponin I: 0.05 ng/mL (ref <0.03)

## 2015-12-26 LAB — BASIC METABOLIC PANEL
Anion gap: 9 (ref 5–15)
BUN: 16 mg/dL (ref 6–20)
CO2: 20 mmol/L — ABNORMAL LOW (ref 22–32)
Calcium: 9.3 mg/dL (ref 8.9–10.3)
Chloride: 109 mmol/L (ref 101–111)
Creatinine, Ser: 1.56 mg/dL — ABNORMAL HIGH (ref 0.61–1.24)
GFR calc Af Amer: 57 mL/min — ABNORMAL LOW (ref >60)
GFR calc non Af Amer: 49 mL/min — ABNORMAL LOW (ref >60)
Glucose, Bld: 121 mg/dL — ABNORMAL HIGH (ref 65–99)
Potassium: 3.7 mmol/L (ref 3.5–5.1)
Sodium: 138 mmol/L (ref 135–145)

## 2015-12-26 LAB — CBC
HCT: 44.2 % (ref 40.0–52.0)
Hemoglobin: 15.5 g/dL (ref 13.0–18.0)
MCH: 30.9 pg (ref 26.0–34.0)
MCHC: 35.2 g/dL (ref 32.0–36.0)
MCV: 87.8 fL (ref 80.0–100.0)
Platelets: 180 10*3/uL (ref 150–440)
RBC: 5.04 MIL/uL (ref 4.40–5.90)
RDW: 12.8 % (ref 11.5–14.5)
WBC: 5.1 10*3/uL (ref 3.8–10.6)

## 2015-12-26 MED ORDER — SODIUM CHLORIDE 0.9 % IV SOLN
Freq: Once | INTRAVENOUS | Status: AC
  Administered 2015-12-26: 13:00:00 via INTRAVENOUS

## 2015-12-26 MED ORDER — LORAZEPAM 1 MG PO TABS: 1.0000 mg | ORAL_TABLET | Freq: Two times a day (BID) | ORAL | Status: AC

## 2015-12-26 MED ORDER — IOPAMIDOL (ISOVUE-370) INJECTION 76%
100.0000 mL | Freq: Once | INTRAVENOUS | Status: AC | PRN
  Administered 2015-12-26: 100 mL via INTRAVENOUS

## 2015-12-26 MED ORDER — LORAZEPAM 2 MG/ML IJ SOLN
1.0000 mg | Freq: Once | INTRAMUSCULAR | Status: AC
  Administered 2015-12-26: 1 mg via INTRAVENOUS
  Filled 2015-12-26: qty 1

## 2015-12-26 MED ORDER — MORPHINE SULFATE (PF) 4 MG/ML IV SOLN
4.0000 mg | Freq: Once | INTRAVENOUS | Status: AC
  Administered 2015-12-26: 4 mg via INTRAVENOUS
  Filled 2015-12-26: qty 1

## 2015-12-26 NOTE — ED Provider Notes (Addendum)
-----------------------------------------   5:36 PM on 12/26/2015 -----------------------------------------   Repeat troponin largely unchanged. I discussed with patient follow-up with Dr. Nehemiah Massed for consideration of a stress test. I discussed strict return precautions. We also discussed the possibility of anxiety. We will discharge with Ativan when necessary and to the patient can be seen by Nehemiah Massed.  Harvest Dark, MD 12/26/15 1736  Harvest Dark, MD 12/26/15 1736

## 2015-12-26 NOTE — ED Notes (Signed)
CP that egan yesterday, worsening today, accompanied by SOB. Pt appears SOB, face reddened. Pt alert and oriented X4, active, cooperative. Pt able to talk in complete sentences, stopping a few times in between.

## 2015-12-26 NOTE — Discharge Instructions (Signed)
Nonspecific Chest Pain It is often hard to find the cause of chest pain. There is always a chance that your pain could be related to something serious, such as a heart attack or a blood clot in your lungs. Chest pain can also be caused by conditions that are not life-threatening. If you have chest pain, it is very important to follow up with your doctor.  HOME CARE  If you were prescribed an antibiotic medicine, finish it all even if you start to feel better.  Avoid any activities that cause chest pain.  Do not use any tobacco products, including cigarettes, chewing tobacco, or electronic cigarettes. If you need help quitting, ask your doctor.  Do not drink alcohol.  Take medicines only as told by your doctor.  Keep all follow-up visits as told by your doctor. This is important. This includes any further testing if your chest pain does not go away.  Your doctor may tell you to keep your head raised (elevated) while you sleep.  Make lifestyle changes as told by your doctor. These may include:  Getting regular exercise. Ask your doctor to suggest some activities that are safe for you.  Eating a heart-healthy diet. Your doctor or a diet specialist (dietitian) can help you to learn healthy eating options.  Maintaining a healthy weight.  Managing diabetes, if necessary.  Reducing stress. GET HELP IF:  Your chest pain does not go away, even after treatment.  You have a rash with blisters on your chest.  You have a fever. GET HELP RIGHT AWAY IF:  Your chest pain is worse.  You have an increasing cough, or you cough up blood.  You have severe belly (abdominal) pain.  You feel extremely weak.  You pass out (faint).  You have chills.  You have sudden, unexplained chest discomfort.  You have sudden, unexplained discomfort in your arms, back, neck, or jaw.  You have shortness of breath at any time.  You suddenly start to sweat, or your skin gets clammy.  You feel  nauseous.  You vomit.  You suddenly feel light-headed or dizzy.  Your heart begins to beat quickly, or it feels like it is skipping beats. These symptoms may be an emergency. Do not wait to see if the symptoms will go away. Get medical help right away. Call your local emergency services (911 in the [place] Do not drive yourself to the hospital.   This information is not intended to replace advice given to you by your health care provider. Make sure you discuss any questions you have with your health care provider.   Document Released: 12/02/2007 Document Revised: 07/06/2014 Document Reviewed: 01/19/2014 Elsevier Interactive Patient Education 2016 Elsevier Inc.  Panic Attacks Panic attacks are sudden, short-livedsurges of severe anxiety, fear, or discomfort. They may occur for no reason when you are relaxed, when you are anxious, or when you are sleeping. Panic attacks may occur for a number of reasons:   Healthy people occasionally have panic attacks in extreme, life-threatening situations, such as war or natural disasters. Normal anxiety is a protective mechanism of the body that helps [place] react to danger (fight or flight response).  Panic attacks are often seen with anxiety disorders, such as panic disorder, social anxiety disorder, generalized anxiety disorder, and phobias. Anxiety disorders cause excessive or uncontrollable anxiety. They may interfere with your relationships or other life activities.  Panic attacks are sometimes seen with other mental illnesses, such as depression and posttraumatic stress disorder.  Certain medical  conditions, prescription medicines, and drugs of abuse can cause panic attacks. SYMPTOMS  Panic attacks start suddenly, peak within 20 minutes, and are accompanied by four or more of the following symptoms:  Pounding heart or fast heart rate (palpitations).  Sweating.  Trembling or shaking.  Shortness of breath or feeling smothered.  Feeling  choked.  Chest pain or discomfort.  Nausea or strange feeling in your stomach.  Dizziness, light-headedness, or feeling like you will faint.  Chills or hot flushes.  Numbness or tingling in your lips or hands and feet.  Feeling that things are not real or feeling that you are not yourself.  Fear of losing control or going crazy.  Fear of dying. Some of these symptoms can mimic serious medical conditions. For example, you may think you are having a heart attack. Although panic attacks can be very scary, they are not life threatening. DIAGNOSIS  Panic attacks are diagnosed through an assessment by your health care provider. Your health care provider will ask questions about your symptoms, such as where and when they occurred. Your health care provider will also ask about your medical history and use of alcohol and drugs, including prescription medicines. Your health care provider may order blood tests or other studies to rule out a serious medical condition. Your health care provider may refer you to a mental health professional for further evaluation. TREATMENT   Most healthy people who have one or two panic attacks in an extreme, life-threatening situation will not require treatment.  The treatment for panic attacks associated with anxiety disorders or other mental illness typically involves counseling with a mental health professional, medicine, or a combination of both. Your health care provider will help determine what treatment is best for you.  Panic attacks due to physical illness usually go away with treatment of the illness. If prescription medicine is causing panic attacks, talk with your health care provider about stopping the medicine, decreasing the dose, or substituting another medicine.  Panic attacks due to alcohol or drug abuse go away with abstinence. Some adults need professional help in order to stop drinking or using drugs. HOME CARE INSTRUCTIONS   Take all  medicines as directed by your health care provider.   Schedule and attend follow-up visits as directed by your health care provider. It is important to keep all your appointments. SEEK MEDICAL CARE IF:  You are not able to take your medicines as prescribed.  Your symptoms do not improve or get worse. SEEK IMMEDIATE MEDICAL CARE IF:   You experience panic attack symptoms that are different than your usual symptoms.  You have serious thoughts about hurting yourself or others.  You are taking medicine for panic attacks and have a serious side effect. MAKE SURE YOU:  Understand these instructions.  Will watch your condition.  Will get help right away if you are not doing well or get worse.   This information is not intended to replace advice given to you by your health care provider. Make sure you discuss any questions you have with your health care provider.   Document Released: 06/15/2005 Document Revised: 06/20/2013 Document Reviewed: 01/27/2013 Elsevier Interactive Patient Education [employer].

## 2015-12-26 NOTE — ED Notes (Signed)
Pt alert and oriented X4, active, cooperative, pt in NAD. RR even and unlabored, color WNL.  Pt informed to return if any life threatening symptoms occur.   

## 2015-12-26 NOTE — ED Provider Notes (Signed)
[facility] Emergency Department Provider Note        Time seen: ----------------------------------------- 12:55 PM on 12/26/2015 -----------------------------------------    I have reviewed the triage vital signs and the nursing notes.   HISTORY  Chief Complaint Chest Pain    HPI Randy Dean is a 50 y.o. male who presents ER for chest pain that began yesterday worsening today with accompanied shortness of breath. Patient presents very short of breath with severe sharp and stabbing right-sided chest pain. Patient is alert and oriented and cooperative but holds to take a breath while talking.Patient has never had symptoms such as severe chest pain like this before but does have a history severe pain and weakness to the right side of his body.   Past Medical History  Diagnosis Date  . Allergic rhinitis   . Vertigo   . Chronic headaches     partially sinus/partially brain injury (per pt)  . Head trauma 2010    has caused anxiety, memory issues and neck problems  . Neck stiffness     metal plate.  mild limitations of side to side and up and dowm movement  . Stroke [facility])     TIA x3- some residual right side weakness  . Weakness of right side of body     from TIAs  . Prediabetes   . GERD (gastroesophageal reflux disease)   . Abnormality of gait 10/30/2015  . Headache 10/30/2015    Right temporal     Patient Active Problem List   Diagnosis Date Noted  . Abnormality of gait 10/30/2015  . Headache 10/30/2015  . Anxiety and depression 11/23/2014  . Brain injuries ([place]) 11/23/2014  . Clinical depression 11/23/2014  . HLD (hyperlipidemia) 11/23/2014  . Insomnia due to medical condition 11/23/2014  . Injury of face and neck 11/23/2014  . Cervical pain 11/23/2014  . Loss of feeling or sensation 11/23/2014  . Tingling 11/23/2014  . Adiposity 11/23/2014  . Allergic rhinitis, seasonal 11/23/2014  . Neurosis, posttraumatic 11/23/2014  . Spinal  stenosis in cervical region 11/23/2014  . Injury brain, traumatic ([place]) 11/23/2014  . Has a tremor 11/23/2014  . Disease of vein 11/23/2014  . Head revolving around 11/23/2014  . Shortness of breath 03/14/2013  . Anxiety 10/11/2008  . Intervertebral cervical disc disorder with myelopathy, cervical region 10/11/2008  . Musculoskeletal disorder and symptoms referable to neck 10/11/2008  . Acid reflux 10/11/2008    Past Surgical History  Procedure Laterality Date  . Neck surgery    . Back surgery    . Eye muscle surgery Left     Due to conential left amblyopia  . Nasal sinus surgery  september 2016  . Endoscopic turbinate reduction Bilateral 08/22/2015    Procedure: ENDOSCOPIC TURBINATE REDUCTION;  Surgeon: Margaretha Sheffield, MD;  Location: [place];  Service: ENT;  Laterality: Bilateral;    Allergies Serzone and Codeine  Social History Social History  Substance Use Topics  . Smoking status: Former Research scientist (life sciences)  . Smokeless tobacco: Current User    Last Attempt to Quit: 06/29/1986     Comment: 1 can dip/day  . Alcohol Use: No    Review of Systems Constitutional: Negative for fever. Cardiovascular: Positive for chest pain Respiratory: Positive for shortness of breath Gastrointestinal: Negative for abdominal pain, vomiting and diarrhea. Genitourinary: Negative for dysuria. Musculoskeletal: Positive for back pain Skin: Negative for rash. Neurological: Negative for headaches, negative for acute weakness  10-point ROS otherwise negative.  ____________________________________________  PHYSICAL EXAM:  VITAL SIGNS: ED Triage Vitals  Enc Vitals Group     BP 12/26/15 1230 115/67 mmHg     Pulse Rate 12/26/15 1230 68     Resp 12/26/15 1230 18     Temp 12/26/15 1230 98.2 F (36.8 C)     Temp Source 12/26/15 1230 Oral     SpO2 12/26/15 1230 99 %     Weight 12/26/15 1230 214 lb (97.07 kg)     Height 12/26/15 1230 5\' 11"  (1.803 m)     Head Cir --      Peak Flow --       Pain Score 12/26/15 1230 7     Pain Loc --      Pain Edu? --      Excl. in [place]? --     Constitutional: Alert, Moderate to severe distress, hyperventilating Eyes: Conjunctivae are normal. PERRL. Normal extraocular movements. ENT   Head: Normocephalic and atraumatic.   Nose: No congestion/rhinnorhea.   Mouth/Throat: Mucous membranes are moist.   Neck: No stridor. Cardiovascular: Normal rate, regular rhythm. No murmurs, rubs, or gallops. Respiratory: Tachypnea with clear breath sounds bilaterally Gastrointestinal: Soft and nontender. Normal bowel sounds Musculoskeletal: Nontender with normal range of motion in all extremities. No lower extremity tenderness nor edema. Neurologic:  No gross focal neurologic deficits are appreciated  Skin:  Skin is warm, dry and intact. Hyperemic appearance to his skin Psychiatric: Anxious mood and affect ____________________________________________  EKG: Interpreted by me. Sinus rhythm with a rate of 66 bpm, normal PR interval, normal QRS, normal QT intervals. Normal axis.  ____________________________________________  ED COURSE:  Pertinent labs & imaging results that were available during my care of the patient were reviewed by me and considered in my medical decision making (see chart for details). Patient resents to ER with concerning chest pain and hyperventilation. This is likely a panic attack however we will check basic labs and imaging. ____________________________________________    LABS (pertinent positives/negatives)  Labs Reviewed  BASIC METABOLIC PANEL - Abnormal; Notable for the following:    CO2 20 (*)    Glucose, Bld 121 (*)    Creatinine, Ser 1.56 (*)    GFR calc non Af Amer 49 (*)    GFR calc Af Amer 57 (*)    All other components within normal limits  TROPONIN I - Abnormal; Notable for the following:    Troponin I 0.04 (*)    All other components within normal limits  CBC  TROPONIN I    RADIOLOGY Images were  viewed by me  Chest x-ray, CT angiogram of the chest abdomen and pelvis, CT head IMPRESSION: Poor inspiration with crowding of the pulmonary vasculature and interstitial markings. Mild interstitial pulmonary edema is Possible. IMPRESSION: Normal head CT. IMPRESSION: No acute aortic abnormality is noted.  No other focal abnormality is seen. ____________________________________________  FINAL ASSESSMENT AND PLAN  Chest pain, anxiety  Plan: Patient with labs and imaging as dictated above. Patient presented with severe chest pain and underwent CT angiogram to rule out dissection. This was negative, CT head and chest x-ray is negative. Workup thus far has been grossly unremarkable other than a minimally elevated troponin 0.04. Repeat troponin is pending, patient care checked out to Dr.Paduchowski  Earleen Newport, MD   Note: This dictation was prepared with Dragon dictation. Any transcriptional errors that result from this process are unintentional   Earleen Newport, MD 12/26/15 1606

## 2016-01-02 DIAGNOSIS — K219 Gastro-esophageal reflux disease without esophagitis: Secondary | ICD-10-CM | POA: Diagnosis not present

## 2016-01-02 DIAGNOSIS — R079 Chest pain, unspecified: Secondary | ICD-10-CM | POA: Diagnosis not present

## 2016-01-02 DIAGNOSIS — Z720 Tobacco use: Secondary | ICD-10-CM | POA: Diagnosis not present

## 2016-01-02 DIAGNOSIS — R0602 Shortness of breath: Secondary | ICD-10-CM | POA: Diagnosis not present

## 2016-01-20 DIAGNOSIS — R079 Chest pain, unspecified: Secondary | ICD-10-CM | POA: Diagnosis not present

## 2016-01-27 DIAGNOSIS — R748 Abnormal levels of other serum enzymes: Secondary | ICD-10-CM | POA: Diagnosis not present

## 2016-01-27 DIAGNOSIS — R072 Precordial pain: Secondary | ICD-10-CM | POA: Diagnosis not present

## 2016-02-26 ENCOUNTER — Encounter: Payer: Self-pay | Admitting: Family Medicine

## 2016-02-26 ENCOUNTER — Ambulatory Visit (INDEPENDENT_AMBULATORY_CARE_PROVIDER_SITE_OTHER): Payer: PPO | Admitting: Family Medicine

## 2016-02-26 ENCOUNTER — Other Ambulatory Visit: Payer: Self-pay | Admitting: Family Medicine

## 2016-02-26 VITALS — BP 122/84 | HR 68 | Temp 98.1°F | Resp 16 | Wt 216.0 lb

## 2016-02-26 DIAGNOSIS — F329 Major depressive disorder, single episode, unspecified: Secondary | ICD-10-CM

## 2016-02-26 DIAGNOSIS — S069X0S Unspecified intracranial injury without loss of consciousness, sequela: Secondary | ICD-10-CM

## 2016-02-26 DIAGNOSIS — F419 Anxiety disorder, unspecified: Secondary | ICD-10-CM

## 2016-02-26 DIAGNOSIS — F32A Depression, unspecified: Secondary | ICD-10-CM

## 2016-02-26 NOTE — Progress Notes (Signed)
Patient: Randy Dean Male    DOB: October 15, 1965   50 y.o.   MRN: [id number] Visit Date: 02/26/2016  Today's Provider: [name], MD   Chief Complaint  Patient presents with  . Depression  . Anxiety   Subjective:    Depression         This is a chronic problem.  Associated symptoms include decreased concentration (Pt says is focus is off. ), fatigue, insomnia, restlessness and headaches.  Associated symptoms include no appetite change and no suicidal ideas.  Past medical history includes anxiety and head trauma.   Anxiety  Presents for follow-up (Has been going of for a year.  But is worsening in the last six months or so.   Pt reports he is not taking Effexor or Ativan right now.  He is confused on what medications he is supposed to be taking.  ) visit. Symptoms include decreased concentration (Pt says is focus is off. ), excessive worry, insomnia, nervous/anxious behavior, panic, restlessness and shortness of breath. Patient reports no dizziness or suicidal ideas. The severity of symptoms is interfering with daily activities. The quality of sleep is poor. Nighttime awakenings: several.        Allergies  Allergen Reactions  . Serzone [Nefazodone]     Tongue and facial swelling  . Codeine Nausea Only    Dizziness, sweating  . Esomeprazole Sodium Rash   No outpatient prescriptions have been marked as taking for the 02/26/16 encounter (Office Visit) with Jerrol Banana., MD.    Review of Systems  Constitutional: Positive for fatigue. Negative for activity change, appetite change, chills, diaphoresis, fever and unexpected weight change.  Respiratory: Positive for shortness of breath. Negative for apnea, cough, choking, chest tightness, wheezing and stridor.        Chronic issue for two years.    Cardiovascular: Negative.   Gastrointestinal: Negative.   Endocrine: Negative.   Musculoskeletal: Negative.   Allergic/Immunologic: Negative.   Neurological:  Positive for tremors, weakness and headaches. Negative for dizziness and light-headedness.  Psychiatric/Behavioral: Positive for decreased concentration (Pt says is focus is off. ), depression and dysphoric mood. Negative for self-injury, sleep disturbance and suicidal ideas. The patient is nervous/anxious and has insomnia. The patient is not hyperactive.        Has a phobia of being "restricted."      Social History  Substance Use Topics  . Smoking status: Former Research scientist (life sciences)  . Smokeless tobacco: Current User    Last attempt to quit: 06/29/1986     Comment: 1 can dip/day  . Alcohol use No   Objective:   BP 122/84 (BP Location: Right Arm, Patient Position: Sitting, Cuff Size: Large)   Pulse 68   Temp 98.1 F (36.7 C) (Oral)   Resp 16   Wt 216 lb (98 kg)   BMI 30.13 kg/m   Physical Exam  Constitutional: He is oriented to person, place, and time. He appears well-developed and well-nourished.  HENT:  Head: Normocephalic and atraumatic.  Right Ear: External ear normal.  Nose: Nose normal.  Eyes: Conjunctivae and EOM are normal. Pupils are equal, round, and reactive to light.  Neck: Normal range of motion. Neck supple.  Cardiovascular: Normal rate, regular rhythm and normal heart sounds.   Pulmonary/Chest: Effort normal and breath sounds normal.  Neurological: He is alert and oriented to person, place, and time.  Skin: Skin is warm, dry and intact.  Psychiatric: His speech is normal  and behavior is normal. Judgment and thought content normal. His mood appears anxious. Cognition and memory are normal.        Assessment & Plan:      1. Anxiety Worsening.  Will restart Effexor today. Then start Topamax two weeks later.    Recheck 1-2 months.    2. Clinical depression Treat as above  3. Injury brain, traumatic, without loss of consciousness, sequela ([place]) Counseled pt on what medications he should be taking.  Advised him to disposed of all of his old medications responsibly and only  keep the ones he is currently taking.  Take fish oil daily and vitamin D daily 4. Headaches Topamax was started for his headaches. Start this a couple weeks after the Effexor so we'll know if he has side effects to either one  Patient was seen and examined by Miguel Aschoff, MD, and note scribed by Ashley Royalty, [place].        Richard Cranford Mon, MD  [facility]

## 2016-02-27 ENCOUNTER — Telehealth: Payer: Self-pay | Admitting: Neurology

## 2016-02-27 NOTE — Telephone Encounter (Signed)
Patient called to advise, he can't do MRI at this time, can't afford, will reschedule this to January, will need new order. Patient also requests that Dr. Jannifer Franklin change the coding so that both tests (will be done at the same time) will be billed as one test, so that insurance will only be billed for one instead of two, "can't afford both tests".

## 2016-02-27 NOTE — Telephone Encounter (Signed)
I called the patient. The patient does not wish to have MRI evaluations until early January 2018. I'm not sure that I can do with he is asking about changing the code so he can have MRI of the brain and cervical spine and only pay for 1 scan.  I will put in a computer reminder to order the MRI studies in early January.

## 2016-02-29 ENCOUNTER — Other Ambulatory Visit: Payer: Self-pay | Admitting: Family Medicine

## 2016-03-05 ENCOUNTER — Ambulatory Visit: Payer: PPO | Admitting: Neurology

## 2016-03-23 ENCOUNTER — Telehealth: Payer: Self-pay | Admitting: Family Medicine

## 2016-03-23 NOTE — Telephone Encounter (Signed)
Please review-aa 

## 2016-03-23 NOTE — Telephone Encounter (Signed)
Pt contacted office for refill request on the following medications: fluticasone (FLONASE) 50 MCG/ACT nasal spray [facility] [place]. Pt stated that he was getting this medication from Dr. [facility] in [place]. Dr. Simeon Craft is no longer with that practice and pt hasn't been going to that office since Dr. Simeon Craft left. Pt would like for Dr. Rosanna Randy to take over this medication. Please advise. Thanks TNP

## 2016-03-23 NOTE — Telephone Encounter (Signed)
Pt stated that the Rx for fluticasone (FLONASE) 50 MCG/ACT nasal spray was written for 2 sprays each nostril twice a day. Thanks TNP

## 2016-03-24 ENCOUNTER — Other Ambulatory Visit: Payer: Self-pay

## 2016-03-24 MED ORDER — FLUTICASONE PROPIONATE 50 MCG/ACT NA SUSP: 2.0000 | Freq: Every day | NASAL | 12 refills | Status: DC

## 2016-03-30 ENCOUNTER — Telehealth: Payer: Self-pay | Admitting: Family Medicine

## 2016-03-30 NOTE — Telephone Encounter (Signed)
Pt states the Rx for fluticasone (FLONASE) 50 MCG/ACT nasal spray was sent in incorrect.  Pt states he is suppose to use 2 sprays in each nostril 2 times a day.  Pt also states he should get 2 bottle at a time.  [facility] [place].  [phone]

## 2016-03-31 MED ORDER — FLUTICASONE PROPIONATE 50 MCG/ACT NA SUSP: 2.0000 | Freq: Two times a day (BID) | NASAL | 12 refills | Status: DC

## 2016-03-31 NOTE — Telephone Encounter (Signed)
rx sent in-aa

## 2016-03-31 NOTE — Telephone Encounter (Signed)
It is just 2 sprays per nostril once a day. Twice a day is not necessary.

## 2016-03-31 NOTE — Telephone Encounter (Signed)
Spoke with patient and he states when he saw ENT before he was advised to do 2 sprays twice daily due to once daily did not work for patient and effects did not last long enough, patient was told to do BID. ENT doctor has left practice now.-aa

## 2016-04-29 ENCOUNTER — Ambulatory Visit (INDEPENDENT_AMBULATORY_CARE_PROVIDER_SITE_OTHER): Payer: PPO | Admitting: Family Medicine

## 2016-04-29 ENCOUNTER — Encounter: Payer: Self-pay | Admitting: Family Medicine

## 2016-04-29 VITALS — BP 110/70 | HR 64 | Temp 97.8°F | Resp 16 | Wt 216.0 lb

## 2016-04-29 DIAGNOSIS — J302 Other seasonal allergic rhinitis: Secondary | ICD-10-CM

## 2016-04-29 DIAGNOSIS — F418 Other specified anxiety disorders: Secondary | ICD-10-CM | POA: Diagnosis not present

## 2016-04-29 DIAGNOSIS — F329 Major depressive disorder, single episode, unspecified: Secondary | ICD-10-CM

## 2016-04-29 DIAGNOSIS — F32A Anxiety disorder, unspecified: Secondary | ICD-10-CM

## 2016-04-29 DIAGNOSIS — Z23 Encounter for immunization: Secondary | ICD-10-CM

## 2016-04-29 DIAGNOSIS — S069X0S Unspecified intracranial injury without loss of consciousness, sequela: Secondary | ICD-10-CM

## 2016-04-29 DIAGNOSIS — F419 Anxiety disorder, unspecified: Secondary | ICD-10-CM

## 2016-04-29 MED ORDER — PAROXETINE HCL 20 MG PO TABS: 20.0000 mg | ORAL_TABLET | Freq: Every day | ORAL | 3 refills | Status: DC

## 2016-04-29 NOTE — Patient Instructions (Addendum)
Stop Venlafaxine and start Paroxetine.

## 2016-04-29 NOTE — Progress Notes (Signed)
Patient: Randy Dean Male    DOB: 07/11/1965   50 y.o.   MRN: [id number] Visit Date: 04/29/2016  Today's Provider: [name], MD   Chief Complaint  Patient presents with  . Depression  . Headache  . Cough   Subjective:    HPI     Depression, Follow-up  He  was last seen for this 2 months ago. Changes made at last visit include restarting Effexor, and then adding Topamax 2 weeks later.   He reports good compliance with treatment. He is having side effects. "Makes me feel out of it."  He reports fair tolerance of treatment. Current symptoms include: difficulty concentrating, fatigue and insomnia He feels he is Unchanged since last visit.  MMSE - Mini Mental State Exam 04/29/2016  Orientation to time 5  Orientation to Place 5  Registration 3  Attention/ Calculation 5  Recall 2  Language- name 2 objects 2  Language- repeat 1  Language- follow 3 step command 3  Language- read & follow direction 1  Write a sentence 1  Copy design 1  Total score 29    ------------------------------------------------------------------------ Headache Pt is currently taking Topamax for this problem. Pt states that this medication will help sometimes, but not others.  Cough: Patient complains of dyspnea, eye irritation, nasal congestion, nonproductive cough, sneezing and tooth pain (intermittent).  Symptoms began 3 weeks ago.  The cough is non-productive, without wheezing or hemoptysis, with shortness of breath and is aggravated by nothing Associated symptoms include:postnasal drip and shortness of breath.    Allergies  Allergen Reactions  . Serzone [Nefazodone]     Tongue and facial swelling  . Codeine Nausea Only    Dizziness, sweating  . Esomeprazole Sodium Rash     Current Outpatient Prescriptions:  .  acetaminophen (TYLENOL) 500 MG tablet, Take 500 mg by mouth every 6 (six) hours as needed., Disp: , Rfl:  .  ALPRAZolam (XANAX) 0.5 MG tablet, Take 2 tablets  approximately 45 minutes prior to the MRI study, take a third tablet if needed., Disp: 3 tablet, Rfl: 0 .  DEXILANT 60 MG capsule, take 1 capsule by mouth once daily, Disp: 30 capsule, Rfl: 12 .  fluticasone (FLONASE) 50 MCG/ACT nasal spray, Place 2 sprays into both nostrils 2 (two) times daily., Disp: 32 g, Rfl: 12 .  meloxicam (MOBIC) 15 MG tablet, take 1 tablet by mouth once daily, Disp: 30 tablet, Rfl: 12 .  Multiple Vitamin (MULTIVITAMIN) capsule, Take 1 capsule by mouth daily. Reported on 09/25/2015, Disp: , Rfl:  .  topiramate (TOPAMAX) 25 MG tablet, Take one tablet at night for one week, then take 2 tablets at night, Disp: 60 tablet, Rfl: 3 .  venlafaxine (EFFEXOR) 75 MG tablet, take 1 tablet by mouth once daily, Disp: 30 tablet, Rfl: 12 .  LORazepam (ATIVAN) 1 MG tablet, Take 1 tablet (1 mg total) by mouth 2 (two) times daily., Disp: 20 tablet, Rfl: 0  Review of Systems  Constitutional: Negative.   Eyes: Negative.   Respiratory: Positive for cough and shortness of breath.   Cardiovascular: Negative for chest pain.  Endocrine: Negative.   Allergic/Immunologic: Negative.   Neurological: Positive for headaches.  Psychiatric/Behavioral: Positive for dysphoric mood.    Social History  Substance Use Topics  . Smoking status: Former Research scientist (life sciences)  . Smokeless tobacco: Current User    Last attempt to quit: 06/29/1986     Comment: 1 can dip/day  . Alcohol use No  Objective:   BP 110/70 (BP Location: Right Arm, Patient Position: Sitting, Cuff Size: Large)   Pulse 64   Temp 97.8 F (36.6 C) (Oral)   Resp 16   Wt 216 lb (98 kg)   SpO2 97%   BMI 30.13 kg/m   Physical Exam  Constitutional: He is oriented to person, place, and time. He appears well-developed and well-nourished.  HENT:  Head: Normocephalic and atraumatic.  Right Ear: Tympanic membrane and external ear normal.  Left Ear: Tympanic membrane and external ear normal.  Mouth/Throat: Oropharynx is clear and moist.  Eyes:  Conjunctivae are normal. Pupils are equal, round, and reactive to light.  Neck: Normal range of motion. Neck supple. No thyromegaly present.  Cardiovascular: Normal rate, regular rhythm and normal heart sounds.   Pulmonary/Chest: Effort normal and breath sounds normal. No respiratory distress.  Abdominal: Soft.  Lymphadenopathy:    He has no cervical adenopathy.  Neurological: He is oriented to person, place, and time. No cranial nerve deficit. He exhibits normal muscle tone. Coordination normal.  Skin: Skin is warm and dry.  Psychiatric: He has a normal mood and affect. His behavior is normal. Judgment and thought content normal.        Assessment & Plan:     1. Chronic seasonal allergic rhinitis due to other allergen Cause of cough today. Continue allergy medications.  2. Anxiety and depression D/C Effexor and start Paxil as below. Recheck 1-2 months. - PARoxetine (PAXIL) 20 MG tablet; Take 1 tablet (20 mg total) by mouth at bedtime.  Dispense: 30 tablet; Refill: 3  3. Traumatic brain injury, without loss of consciousness, sequela (HCC) MMSE stable at 29.   4. Flu vaccine need Administered today. - Flu Vaccine QUAD 36+ mos IM     Patient seen and examined by Miguel Aschoff, MD, and note scribed by Renaldo Fiddler, CMA. I have done the exam and reviewed the above chart and it is accurate to the best of my knowledge.  Richard Cranford Mon, MD  [facility]

## 2016-06-04 ENCOUNTER — Emergency Department: Payer: PPO

## 2016-06-04 ENCOUNTER — Encounter: Payer: Self-pay | Admitting: Emergency Medicine

## 2016-06-04 ENCOUNTER — Emergency Department
Admission: EM | Admit: 2016-06-04 | Discharge: 2016-06-04 | Disposition: A | Discharge status: Home / Self Care | Payer: PPO | Attending: Emergency Medicine | Admitting: Emergency Medicine

## 2016-06-04 DIAGNOSIS — Y999 Unspecified external cause status: Secondary | ICD-10-CM | POA: Diagnosis not present

## 2016-06-04 DIAGNOSIS — W231XXA Caught, crushed, jammed, or pinched between stationary objects, initial encounter: Secondary | ICD-10-CM | POA: Insufficient documentation

## 2016-06-04 DIAGNOSIS — Z791 Long term (current) use of non-steroidal anti-inflammatories (NSAID): Secondary | ICD-10-CM | POA: Insufficient documentation

## 2016-06-04 DIAGNOSIS — S62640A Nondisplaced fracture of proximal phalanx of right index finger, initial encounter for closed fracture: Secondary | ICD-10-CM

## 2016-06-04 DIAGNOSIS — Z79899 Other long term (current) drug therapy: Secondary | ICD-10-CM | POA: Insufficient documentation

## 2016-06-04 DIAGNOSIS — Y929 Unspecified place or not applicable: Secondary | ICD-10-CM | POA: Insufficient documentation

## 2016-06-04 DIAGNOSIS — F17200 Nicotine dependence, unspecified, uncomplicated: Secondary | ICD-10-CM | POA: Insufficient documentation

## 2016-06-04 DIAGNOSIS — S61210A Laceration without foreign body of right index finger without damage to nail, initial encounter: Secondary | ICD-10-CM | POA: Insufficient documentation

## 2016-06-04 DIAGNOSIS — Y9389 Activity, other specified: Secondary | ICD-10-CM | POA: Diagnosis not present

## 2016-06-04 DIAGNOSIS — S6991XA Unspecified injury of right wrist, hand and finger(s), initial encounter: Secondary | ICD-10-CM | POA: Diagnosis not present

## 2016-06-04 NOTE — ED Triage Notes (Signed)
Pt ambulatory to triage with steady gait, no distress noted. Pt reports he was using a strap on a vehicle tire when pressure from the strap busted and caught pts right first finger. Pt has a 1inch laceration to the inside of first finger. No bleeding or deformity noted.

## 2016-06-04 NOTE — ED Provider Notes (Signed)
[facility] Emergency Department Provider Note  ____________________________________________  Time seen: Approximately 9:32 PM  I have reviewed the triage vital signs and the nursing notes.   HISTORY  Chief Complaint Finger Injury    HPI Randy Dean is a 50 y.o. male who presents to emergency department complaining of a laceration to the second digit of the right hand. Patient states that he is attempting to separate the bead on a tire. He had wretched straps in place as he filled up a tire. When he released the ratchet strap, it was under pressure and flew back and caught his finger. Patient reports laceration to the second digit of the finger. Denies any numbness or tingling to the finger. He denies any loss of range of motion to the finger. No other injury or complaint. No medications prior to arrival. Bleeding was easily controlled with direct pressure.   Past Medical History:  Diagnosis Date  . Abnormality of gait 10/30/2015  . Allergic rhinitis   . Chronic headaches    partially sinus/partially brain injury (per pt)  . GERD (gastroesophageal reflux disease)   . Head trauma 2010   has caused anxiety, memory issues and neck problems  . Headache 10/30/2015   Right temporal   . Neck stiffness    metal plate.  mild limitations of side to side and up and dowm movement  . Prediabetes   . Stroke [facility])    TIA x3- some residual right side weakness  . Vertigo   . Weakness of right side of body    from TIAs    Patient Active Problem List   Diagnosis Date Noted  . Abnormality of gait 10/30/2015  . Headache 10/30/2015  . Anxiety and depression 11/23/2014  . Brain injuries ([place]) 11/23/2014  . Clinical depression 11/23/2014  . HLD (hyperlipidemia) 11/23/2014  . Insomnia due to medical condition 11/23/2014  . Injury of face and neck 11/23/2014  . Cervical pain 11/23/2014  . Loss of feeling or sensation 11/23/2014  . Tingling 11/23/2014  . Adiposity  11/23/2014  . Allergic rhinitis, seasonal 11/23/2014  . Neurosis, posttraumatic 11/23/2014  . Spinal stenosis in cervical region 11/23/2014  . Injury brain, traumatic ([place]) 11/23/2014  . Has a tremor 11/23/2014  . Disease of vein 11/23/2014  . Head revolving around 11/23/2014  . Shortness of breath 03/14/2013  . Anxiety 10/11/2008  . Intervertebral cervical disc disorder with myelopathy, cervical region 10/11/2008  . Musculoskeletal disorder and symptoms referable to neck 10/11/2008  . Acid reflux 10/11/2008    Past Surgical History:  Procedure Laterality Date  . BACK SURGERY    . ENDOSCOPIC TURBINATE REDUCTION Bilateral 08/22/2015   Procedure: ENDOSCOPIC TURBINATE REDUCTION;  Surgeon: Margaretha Sheffield, MD;  Location: [place];  Service: ENT;  Laterality: Bilateral;  . EYE MUSCLE SURGERY Left    Due to conential left amblyopia  . NASAL SINUS SURGERY  september 2016  . NECK SURGERY      Prior to Admission medications   Medication Sig Start Date End Date Taking? Authorizing Provider  acetaminophen (TYLENOL) 500 MG tablet Take 500 mg by mouth every 6 (six) hours as needed.    Historical Provider, MD  ALPRAZolam Duanne Moron) 0.5 MG tablet Take 2 tablets approximately 45 minutes prior to the MRI study, take a third tablet if needed. 10/30/15   Kathrynn Ducking, MD  DEXILANT 60 MG capsule take 1 capsule by mouth once daily 08/20/15   Jerrol Banana., MD  fluticasone (  FLONASE) 50 MCG/ACT nasal spray Place 2 sprays into both nostrils 2 (two) times daily. 03/31/16   Richard Maceo Pro., MD  LORazepam (ATIVAN) 1 MG tablet Take 1 tablet (1 mg total) by mouth 2 (two) times daily. 12/26/15 12/25/16  Earleen Newport, MD  meloxicam [facility]) 15 MG tablet take 1 tablet by mouth once daily 03/03/16   Jerrol Banana., MD  Multiple Vitamin (MULTIVITAMIN) capsule Take 1 capsule by mouth daily. Reported on 09/25/2015    Historical Provider, MD  Omega-3 Fatty Acids (FISH OIL) 1000 MG CAPS Take  by mouth.    Historical Provider, MD  PARoxetine (PAXIL) 20 MG tablet Take 1 tablet (20 mg total) by mouth at bedtime. 04/29/16   Richard Maceo Pro., MD  topiramate (TOPAMAX) 25 MG tablet Take one tablet at night for one week, then take 2 tablets at night 10/30/15   Kathrynn Ducking, MD    Allergies Serzone [nefazodone]; Codeine; and Esomeprazole sodium  Family History  Problem Relation Age of Onset  . Fibromyalgia Mother   . Heart disease Father   . Tremor Father   . Diabetes Maternal Grandmother   . Hypertension Maternal Grandmother   . Hypertension Maternal Grandfather   . Diabetes Paternal Grandmother   . Hypertension Paternal Grandmother   . Hypertension Paternal Grandfather     Social History Social History  Substance Use Topics  . Smoking status: Former Research scientist (life sciences)  . Smokeless tobacco: Current User    Last attempt to quit: 06/29/1986     Comment: 1 can dip/day  . Alcohol use No     Review of Systems  Constitutional: No fever/chills Cardiovascular: no chest pain. Respiratory: no cough. No SOB. Musculoskeletal: Injury to second digit of the right hand with laceration Skin: Negative for rash, abrasions, lacerations, ecchymosis. Neurological: Negative for headaches, focal weakness or numbness. 10-point ROS otherwise negative.  ____________________________________________   PHYSICAL EXAM:  VITAL SIGNS: ED Triage Vitals  Enc Vitals Group     BP 06/04/16 2102 109/67     Pulse Rate 06/04/16 2102 62     Resp 06/04/16 2102 18     Temp 06/04/16 2102 98.2 F (36.8 C)     Temp Source 06/04/16 2102 Oral     SpO2 06/04/16 2102 97 %     Weight 06/04/16 2036 214 lb (97.1 kg)     Height 06/04/16 2036 6' (1.829 m)     Head Circumference --      Peak Flow --      Pain Score --      Pain Loc --      Pain Edu? --      Excl. in [place]? --      Constitutional: Alert and oriented. Well appearing and in no acute distress. Eyes: Conjunctivae are normal. PERRL. EOMI. Head:  Atraumatic. Neck: No stridor.    Cardiovascular: Normal rate, regular rhythm. Normal S1 and S2.  Good peripheral circulation. Respiratory: Normal respiratory effort without tachypnea or retractions. Lungs CTAB. Good air entry to the bases with no decreased or absent breath sounds. Musculoskeletal: Full range of motion to all extremities. No gross deformities appreciated. Full range of motion to second digit of the hand. Laceration is noted to be medial aspect of second digit. Full range of motion of finger. Patient is tender to palpation middle of the proximal phalanx. No palpable underlying. Sensation and cap refill intact distal finger. Neurologic:  Normal speech and language. No gross focal neurologic deficits are  appreciated.  Skin:  Skin is warm, dry and intact. No rash noted. Psychiatric: Mood and affect are normal. Speech and behavior are normal. Patient exhibits appropriate insight and judgement.   ____________________________________________   LABS (all labs ordered are listed, but only abnormal results are displayed)  Labs Reviewed - No data to display ____________________________________________  EKG   ____________________________________________  RADIOLOGY Diamantina Providence Cuthriell, personally viewed and evaluated these images (plain radiographs) as part of my medical decision making, as well as reviewing the written report by the radiologist. On my exam, patient is tender over the middle of the phalanx. My review of x-ray reveals cortical disruption underlying fracture. This appears nondisplaced. No other osseous abnormality.  Dg Hand Complete Right  Result Date: 06/04/2016 CLINICAL DATA:  Initial evaluation for acute trauma, laceration to right first finger. EXAM: RIGHT HAND - COMPLETE 3+ VIEW COMPARISON:  None. FINDINGS: No acute fracture or dislocation. Joint spaces maintained. Osseous mineralization normal. Mild degenerative osteoarthritic changes at the right first DIP  joint. No appreciable soft tissue injury. No radiopaque foreign body. IMPRESSION: 1. No acute osseous abnormality about the right hand. 2. No radiopaque foreign body identified. Electronically Signed   By: Jeannine Boga M.D.   On: 06/04/2016 21:35    ____________________________________________    PROCEDURES  Procedure(s) performed:    Marland KitchenMarland KitchenLaceration Repair Date/Time: 06/04/2016 9:33 PM Performed by: Betha Loa D Authorized by: Betha Loa D   Consent:    Consent obtained:  Verbal   Consent given by:  Patient   Risks discussed:  Pain Anesthesia (see MAR for exact dosages):    Anesthesia method:  None Laceration details:    Location:  Finger   Finger location:  R index finger   Length (cm):  3 Repair type:    Repair type:  Simple Pre-procedure details:    Preparation:  Imaging obtained to evaluate for foreign bodies Exploration:    Wound exploration: wound explored through full range of motion and entire depth of wound probed and visualized     Wound extent: underlying fracture     Wound extent: no foreign bodies/material noted, no muscle damage noted, no nerve damage noted and no tendon damage noted     Contaminated: no   Treatment:    Area cleansed with:  Shur-Clens   Amount of cleaning:  Standard Skin repair:    Repair method:  Tissue adhesive Approximation:    Approximation:  Close Post-procedure details:    Dressing:  Splint for protection   Patient tolerance of procedure:  Tolerated well, no immediate complications      Medications - No data to display   ____________________________________________   INITIAL IMPRESSION / ASSESSMENT AND PLAN / ED COURSE  Pertinent labs & imaging results that were available during my care of the patient were reviewed by me and considered in my medical decision making (see chart for details).  Review of the [place] CSRS was performed in accordance of the [place] prior to dispensing any controlled  drugs.  Clinical Course     Patient's diagnosis is consistent with Laceration of the right index finger and small, nondisplaced fracture to the proximal phalanx of the second digit. Report from radiologist on x-ray reveals no acute osseous abnormality, however patient is tender to palpation over area that appears to have cortical disruption. This is nondisplaced. Patient's exam is reassuring. Laceration is closed as described above, patient's finger will be buddy taped and splinted for 1 week. The patient continues to have problems he  can follow up with orthopedics or primary care. Patient to take Tylenol and Motrin at home for pain control. Patient's tetanus shot is up-to-date..  Patient is given ED precautions to return to the ED for any worsening or new symptoms.     ____________________________________________  FINAL CLINICAL IMPRESSION(S) / ED DIAGNOSES  Final diagnoses:  Laceration of right index finger without foreign body without damage to nail, initial encounter  Closed nondisplaced fracture of proximal phalanx of right index finger, initial encounter      NEW MEDICATIONS STARTED DURING THIS VISIT:  New Prescriptions   No medications on file        This chart was dictated using voice recognition software/Dragon. Despite best efforts to proofread, errors can occur which can change the meaning. Any change was purely unintentional.    Darletta Moll, PA-C 06/04/16 2143    Orbie Pyo, MD 06/05/16 [phone]

## 2016-06-11 ENCOUNTER — Ambulatory Visit: Payer: PPO | Admitting: Family Medicine

## 2016-06-30 ENCOUNTER — Telehealth: Payer: Self-pay | Admitting: Neurology

## 2016-06-30 NOTE — Telephone Encounter (Signed)
I called the patient. The patient was seen previously, he did not want MRI of the brain and cervical cord to be done until early 2018, If he desires to get the studies, he is to call our office.

## 2016-07-02 ENCOUNTER — Ambulatory Visit (INDEPENDENT_AMBULATORY_CARE_PROVIDER_SITE_OTHER): Payer: PPO | Admitting: Family Medicine

## 2016-07-02 VITALS — BP 108/68 | HR 78 | Resp 16 | Wt 221.0 lb

## 2016-07-02 DIAGNOSIS — F419 Anxiety disorder, unspecified: Principal | ICD-10-CM

## 2016-07-02 DIAGNOSIS — F418 Other specified anxiety disorders: Secondary | ICD-10-CM | POA: Diagnosis not present

## 2016-07-02 DIAGNOSIS — G4701 Insomnia due to medical condition: Secondary | ICD-10-CM

## 2016-07-02 DIAGNOSIS — K219 Gastro-esophageal reflux disease without esophagitis: Secondary | ICD-10-CM

## 2016-07-02 DIAGNOSIS — F32A Anxiety disorder, unspecified: Secondary | ICD-10-CM

## 2016-07-02 DIAGNOSIS — S069X0S Unspecified intracranial injury without loss of consciousness, sequela: Secondary | ICD-10-CM

## 2016-07-02 DIAGNOSIS — M4802 Spinal stenosis, cervical region: Secondary | ICD-10-CM | POA: Diagnosis not present

## 2016-07-02 DIAGNOSIS — F329 Major depressive disorder, single episode, unspecified: Secondary | ICD-10-CM

## 2016-07-02 MED ORDER — SERTRALINE HCL 50 MG PO TABS: 50.0000 mg | ORAL_TABLET | Freq: Every day | ORAL | 3 refills | Status: DC

## 2016-07-02 NOTE — Patient Instructions (Signed)
Stop taking Paroxetine (Paxil). Start taking Sertraline (Zoloft).

## 2016-07-02 NOTE — Progress Notes (Signed)
Randy Dean  MRN: [id number] DOB: 1965-08-20  Subjective:  HPI  Patient is here for follow up after visit in November 2017. At that time Effexor was stopped and switched to Paxil. He has been taking Paxil a few times not daily due to when he takes this "does not feel write in the head". HE feels about the same. He is taking Lorazepam and Xanax as needed not often. He does have hard time sleeping as his usual and will take Lorazepam at times for this. Depression screen PHQ 2/9 07/02/2016  Decreased Interest 0  Down, Depressed, Hopeless 0  PHQ - 2 Score 0  Altered sleeping 3  Tired, decreased energy 3  Change in appetite 1  Feeling bad or failure about yourself  1  Trouble concentrating 1  Moving slowly or fidgety/restless 3  Suicidal thoughts 0  PHQ-9 Score 12  Difficult doing work/chores Very difficult   Patient Active Problem List   Diagnosis Date Noted  . Abnormality of gait 10/30/2015  . Headache 10/30/2015  . Anxiety and depression 11/23/2014  . Brain injuries ([place]) 11/23/2014  . Clinical depression 11/23/2014  . HLD (hyperlipidemia) 11/23/2014  . Insomnia due to medical condition 11/23/2014  . Injury of face and neck 11/23/2014  . Cervical pain 11/23/2014  . Loss of feeling or sensation 11/23/2014  . Tingling 11/23/2014  . Adiposity 11/23/2014  . Allergic rhinitis, seasonal 11/23/2014  . Neurosis, posttraumatic 11/23/2014  . Spinal stenosis in cervical region 11/23/2014  . Injury brain, traumatic ([place]) 11/23/2014  . Has a tremor 11/23/2014  . Disease of vein 11/23/2014  . Head revolving around 11/23/2014  . Shortness of breath 03/14/2013  . Anxiety 10/11/2008  . Intervertebral cervical disc disorder with myelopathy, cervical region 10/11/2008  . Musculoskeletal disorder and symptoms referable to neck 10/11/2008  . Acid reflux 10/11/2008    Past Medical History:  Diagnosis Date  . Abnormality of gait 10/30/2015  . Allergic rhinitis   . Chronic headaches     partially sinus/partially brain injury (per pt)  . GERD (gastroesophageal reflux disease)   . Head trauma 2010   has caused anxiety, memory issues and neck problems  . Headache 10/30/2015   Right temporal   . Neck stiffness    metal plate.  mild limitations of side to side and up and dowm movement  . Prediabetes   . Stroke [facility])    TIA x3- some residual right side weakness  . Vertigo   . Weakness of right side of body    from TIAs    Social History   Social History  . Marital status: Married    Spouse name: Lattie Haw  . Number of children: 0  . Years of education: 14   Occupational History  . Not on file.   Social History Main Topics  . Smoking status: Former Research scientist (life sciences)  . Smokeless tobacco: Current User    Last attempt to quit: 06/29/1986     Comment: 1 can dip/day  . Alcohol use No  . Drug use: No  . Sexual activity: Not on file   Other Topics Concern  . Not on file   Social History Narrative   Lives at home with his wife Lattie Haw   Right-handed   Drinks tea or soda about 6 times per day    Outpatient Encounter Prescriptions as of 07/02/2016  Medication Sig  . acetaminophen (TYLENOL) 500 MG tablet Take 500 mg by mouth every 6 (six) hours as needed.  Marland Kitchen  ALPRAZolam (XANAX) 0.5 MG tablet Take 2 tablets approximately 45 minutes prior to the MRI study, take a third tablet if needed.  Marland Kitchen DEXILANT 60 MG capsule take 1 capsule by mouth once daily  . fluticasone (FLONASE) 50 MCG/ACT nasal spray Place 2 sprays into both nostrils 2 (two) times daily.  Marland Kitchen LORazepam (ATIVAN) 1 MG tablet Take 1 tablet (1 mg total) by mouth 2 (two) times daily.  . meloxicam (MOBIC) 15 MG tablet take 1 tablet by mouth once daily  . Multiple Vitamin (MULTIVITAMIN) capsule Take 1 capsule by mouth daily. Reported on 09/25/2015  . Omega-3 Fatty Acids (FISH OIL) 1000 MG CAPS Take by mouth.  Marland Kitchen PARoxetine (PAXIL) 20 MG tablet Take 1 tablet (20 mg total) by mouth at bedtime.  . topiramate (TOPAMAX) 25 MG tablet Take  one tablet at night for one week, then take 2 tablets at night   No facility-administered encounter medications on file as of 07/02/2016.     Allergies  Allergen Reactions  . Serzone [Nefazodone]     Tongue and facial swelling  . Codeine Nausea Only    Dizziness, sweating  . Esomeprazole Sodium Rash    Review of Systems  Respiratory: Negative.   Cardiovascular: Negative.   Gastrointestinal: Positive for constipation.  Musculoskeletal: Positive for joint pain and neck pain.       Stiffness, abnormal gait. Pinched nerve pain in right arm and leg  Neurological: Positive for tingling (in legs and numbness), tremors and weakness.       Having trouble moving his limbs or body at times when he wants to-body parts wont move always the way he wants it to do.  Psychiatric/Behavioral: Positive for depression. The patient is nervous/anxious and has insomnia.        Panic attacks and gasps for air at times, gets agitated and aggravated sometimes    Objective:  BP 108/68   Pulse 78   Resp 16   Wt 221 lb (100.2 kg)   BMI 29.97 kg/m   Physical Exam  Constitutional: He is oriented to person, place, and time and well-developed, well-nourished, and in no distress.  HENT:  Head: Normocephalic and atraumatic.  Eyes: Conjunctivae are normal. No scleral icterus.  Cardiovascular: Normal rate, regular rhythm and normal heart sounds.   Pulmonary/Chest: Effort normal and breath sounds normal.  Abdominal: Soft.  Neurological: He is alert and oriented to person, place, and time. He exhibits abnormal muscle tone. Coordination normal. GCS score is 15.  Skin: Skin is warm and dry.  Psychiatric: Mood, memory, affect and judgment normal.    Assessment and Plan :  1. Anxiety and depression Not able to tolerated Paxil well. Try Zoloft.Return to clinic 1-2 months. PHQ9 is 12. 2. Traumatic brain injury, without loss of consciousness, sequela ([place]) He has a pending MRI of the brain and spine due to cost.  Neurology does not feel that his symptoms are consistent with TBI.  3. Gastroesophageal reflux disease, esophagitis presence not specified  4. Insomnia due to medical condition  5. Spinal stenosis in cervical region  HPI, Exam and A&P transcribed under direction and in the presence of Miguel Aschoff, MD. I have done the exam and reviewed the chart and it is accurate to the best of my knowledge. Development worker, community has been used and  any errors in dictation or transcription are unintentional. Miguel Aschoff M.D. [facility]

## 2016-07-15 ENCOUNTER — Ambulatory Visit: Payer: Self-pay | Admitting: Neurology

## 2016-08-15 ENCOUNTER — Telehealth: Payer: Self-pay | Admitting: Family Medicine

## 2016-08-15 NOTE — Telephone Encounter (Signed)
Pt stated a few OV ago Dr. Rosanna Randy advised him to get vitamins of a certain brand and the pt can't remember what those were and lost the paper that Dr. Rosanna Randy had written them on. Pt is requesting a call back to advised what he is supposed to get. Please advise. Thanks TNP

## 2016-08-17 NOTE — Telephone Encounter (Signed)
Pt informed

## 2016-08-17 NOTE — Telephone Encounter (Signed)
Fish oil and phosphatidylserine are the vitamins, patient is driving right now so he will call me back so I can tell him the name of these-aa

## 2016-08-20 ENCOUNTER — Other Ambulatory Visit: Payer: Self-pay | Admitting: Family Medicine

## 2016-08-26 ENCOUNTER — Telehealth: Payer: Self-pay | Admitting: Family Medicine

## 2016-08-26 MED ORDER — OSELTAMIVIR PHOSPHATE 75 MG PO CAPS: 75.0000 mg | ORAL_CAPSULE | Freq: Two times a day (BID) | ORAL | 0 refills | Status: AC

## 2016-08-26 NOTE — Telephone Encounter (Signed)
Pt wife was diagnosed  with the flu Monday by Simona Huh.  He is now having symptoms.  Simona Huh told him to call back in for Tamiflu if he started having symptoms .  He uses [facility]  His call back [phone]  Thank sTeri

## 2016-08-26 NOTE — Telephone Encounter (Signed)
Can you please review for [facility]

## 2016-09-03 ENCOUNTER — Encounter: Payer: Self-pay | Admitting: Physician Assistant

## 2016-09-03 ENCOUNTER — Ambulatory Visit (INDEPENDENT_AMBULATORY_CARE_PROVIDER_SITE_OTHER): Payer: PPO | Admitting: Physician Assistant

## 2016-09-03 VITALS — BP 112/60 | HR 80 | Temp 98.8°F | Resp 16 | Wt 217.0 lb

## 2016-09-03 DIAGNOSIS — J0110 Acute frontal sinusitis, unspecified: Secondary | ICD-10-CM

## 2016-09-03 MED ORDER — CEFDINIR 300 MG PO CAPS: 300.0000 mg | ORAL_CAPSULE | Freq: Two times a day (BID) | ORAL | 0 refills | Status: DC

## 2016-09-03 NOTE — Progress Notes (Signed)
[place]  Chief Complaint  Patient presents with  . Sinusitis    Started 8 days ago.   Marland Kitchen URI    Subjective:    Patient ID: Randy Dean, male    DOB: September 04, 1965, 51 y.o.   MRN: [id number]  Upper Respiratory Infection: Randy Dean is a 51 y.o. male with a past medical history significant for allergic rhinitis complaining of symptoms of a URI, possible sinusitis. Symptoms include bilateral ear pain, congestion, sore throat and swollen glands. Onset of symptoms was 8 days ago, gradually worsening since that time. He also c/o bilateral ear pressure/pain, achiness, lightheadedness, low grade fever, nasal congestion, nausea without vomiting and post nasal drip for the past 8 days . His wife was diagnosed with the flu around 08/24/2016. He started developing similar symptoms and was treated with Tamiflu, which ended this past Sunday. He was also given Augmentin 875/125 mg BID x 10 days from his dentist for a tootheache.  He is drinking moderate amounts of fluids. Evaluation to date: none. Treatment to date: Tamiflu. The treatment has provided no relief.  He feels achy, but says he feels this way with sinus infections.  Review of Systems  Constitutional: Positive for chills, diaphoresis and fatigue. Negative for activity change, appetite change, fever and unexpected weight change.  HENT: Positive for congestion, ear pain, nosebleeds, postnasal drip, rhinorrhea, sinus pain, sinus pressure, sneezing, sore throat, tinnitus and trouble swallowing. Negative for ear discharge.   Eyes: Positive for discharge. Negative for photophobia, pain, redness, itching and visual disturbance.  Respiratory: Positive for cough, chest tightness and shortness of breath. Negative for apnea, choking and wheezing.   Gastrointestinal: Positive for constipation and nausea. Negative for abdominal distention, abdominal pain, anal bleeding, blood in stool, diarrhea, rectal pain and  vomiting.  Musculoskeletal: Positive for arthralgias, myalgias, neck pain and neck stiffness.  Neurological: Negative for dizziness, light-headedness and headaches.       Objective:   BP 112/60 (BP Location: Left Arm, Patient Position: Sitting, Cuff Size: Large)   Pulse 80   Temp 98.8 F (37.1 C) (Oral)   Resp 16   Wt 217 lb (98.4 kg)   BMI 29.43 kg/m   Patient Active Problem List   Diagnosis Date Noted  . Abnormality of gait 10/30/2015  . Headache 10/30/2015  . Anxiety and depression 11/23/2014  . Brain injuries ([place]) 11/23/2014  . Clinical depression 11/23/2014  . HLD (hyperlipidemia) 11/23/2014  . Insomnia due to medical condition 11/23/2014  . Injury of face and neck 11/23/2014  . Cervical pain 11/23/2014  . Loss of feeling or sensation 11/23/2014  . Tingling 11/23/2014  . Adiposity 11/23/2014  . Allergic rhinitis, seasonal 11/23/2014  . Neurosis, posttraumatic 11/23/2014  . Spinal stenosis in cervical region 11/23/2014  . Injury brain, traumatic ([place]) 11/23/2014  . Has a tremor 11/23/2014  . Disease of vein 11/23/2014  . Head revolving around 11/23/2014  . Shortness of breath 03/14/2013  . Anxiety 10/11/2008  . Intervertebral cervical disc disorder with myelopathy, cervical region 10/11/2008  . Musculoskeletal disorder and symptoms referable to neck 10/11/2008  . Acid reflux 10/11/2008    Outpatient Encounter Prescriptions as of 09/03/2016  Medication Sig  . acetaminophen (TYLENOL) 500 MG tablet Take 500 mg by mouth every 6 (six) hours as needed.  Marland Kitchen DEXILANT 60 MG capsule take 1 capsule by mouth once daily  . fluticasone (FLONASE) 50 MCG/ACT nasal spray Place 2 sprays into both nostrils 2 (two) times daily.  Marland Kitchen  LORazepam (ATIVAN) 1 MG tablet Take 1 tablet (1 mg total) by mouth 2 (two) times daily.  . meloxicam (MOBIC) 15 MG tablet take 1 tablet by mouth once daily  . Multiple Vitamin (MULTIVITAMIN) capsule Take 1 capsule by mouth daily. Reported on 09/25/2015  .  Omega-3 Fatty Acids (FISH OIL) 1000 MG CAPS Take by mouth.  . sertraline (ZOLOFT) 50 MG tablet Take 1 tablet (50 mg total) by mouth daily.  Marland Kitchen topiramate (TOPAMAX) 25 MG tablet Take one tablet at night for one week, then take 2 tablets at night  . ALPRAZolam (XANAX) 0.5 MG tablet Take 2 tablets approximately 45 minutes prior to the MRI study, take a third tablet if needed.  . cefdinir (OMNICEF) 300 MG capsule Take 1 capsule (300 mg total) by mouth 2 (two) times daily.   No facility-administered encounter medications on file as of 09/03/2016.     Allergies  Allergen Reactions  . Serzone [Nefazodone]     Tongue and facial swelling  . Codeine Nausea Only    Dizziness, sweating  . Effexor [Venlafaxine]     "Feels out of it"  . Paxil [Paroxetine Hcl]     "does not feel right in the head"  . Esomeprazole Sodium Rash       Physical Exam  Constitutional: He is oriented to person, place, and time. He appears well-developed and well-nourished. No distress.  HENT:  Right Ear: External ear normal.  Left Ear: External ear normal.  Nose: Right sinus exhibits maxillary sinus tenderness and frontal sinus tenderness. Left sinus exhibits maxillary sinus tenderness and frontal sinus tenderness.  Mouth/Throat: Mucous membranes are normal. Posterior oropharyngeal erythema present. No oropharyngeal exudate, posterior oropharyngeal edema or tonsillar abscesses.  Eyes: Conjunctivae are normal.  Neck: Normal range of motion.  Cardiovascular: Normal rate and regular rhythm.   Pulmonary/Chest: Effort normal and breath sounds normal. No respiratory distress. He has no wheezes. He has no rales.  Lymphadenopathy:    He has cervical adenopathy.  Neurological: He is alert and oriented to person, place, and time.  Skin: Skin is warm and dry. He is not diaphoretic.  Psychiatric: He has a normal mood and affect. His behavior is normal.       Assessment & Plan:   1. Acute non-recurrent frontal sinusitis  -  cefdinir (OMNICEF) 300 MG capsule; Take 1 capsule (300 mg total) by mouth 2 (two) times daily.  Dispense: 20 capsule; Refill: 0  Return if symptoms worsen or fail to improve.   Patient Instructions  Sinusitis, Adult Sinusitis is soreness and inflammation of your sinuses. Sinuses are hollow spaces in the bones around your face. They are located:  Around your eyes.  In the middle of your forehead.  Behind your nose.  In your cheekbones. Your sinuses and nasal passages are lined with a stringy fluid (mucus). Mucus normally drains out of your sinuses. When your nasal tissues get inflamed or swollen, the mucus can get trapped or blocked so air cannot flow through your sinuses. This lets bacteria, viruses, and funguses grow, and that leads to infection. Follow these instructions at home: Medicines   Take, use, or apply over-the-counter and prescription medicines only as told by your doctor. These may include nasal sprays.  If you were prescribed an antibiotic medicine, take it as told by your doctor. Do not stop taking the antibiotic even if you start to feel better. Hydrate and Humidify   Drink enough water to keep your pee (urine) clear or pale yellow.  Use a cool mist humidifier to keep the humidity level in your home above 50%.  Breathe in steam for 10-15 minutes, 3-4 times a day or as told by your doctor. You can do this in the bathroom while a hot shower is running.  Try not to spend time in cool or dry air. Rest   Rest as much as possible.  Sleep with your head raised (elevated).  Make sure to get enough sleep each night. General instructions   Put a warm, moist washcloth on your face 3-4 times a day or as told by your doctor. This will help with discomfort.  Wash your hands often with soap and water. If there is no soap and water, use hand sanitizer.  Do not smoke. Avoid being around people who are smoking (secondhand smoke).  Keep all follow-up visits as told by your  doctor. This is important. Contact a doctor if:  You have a fever.  Your symptoms get worse.  Your symptoms do not get better within 10 days. Get help right away if:  You have a very bad headache.  You cannot stop throwing up (vomiting).  You have pain or swelling around your face or eyes.  You have trouble seeing.  You feel confused.  Your neck is stiff.  You have trouble breathing. This information is not intended to replace advice given to you by your health care provider. Make sure you discuss any questions you have with your health care provider. Document Released: 12/02/2007 Document Revised: 02/09/2016 Document Reviewed: 04/10/2015 Elsevier Interactive Patient Education  2017 [employer].    The entirety of the information documented in the History of Present Illness, Review of Systems and Physical Exam were personally obtained by me. Portions of this information were initially documented by Ashley Royalty, CMA and reviewed by me for thoroughness and accuracy.

## 2016-09-03 NOTE — Patient Instructions (Signed)

## 2016-09-07 ENCOUNTER — Ambulatory Visit (INDEPENDENT_AMBULATORY_CARE_PROVIDER_SITE_OTHER): Payer: PPO | Admitting: Family Medicine

## 2016-09-07 VITALS — BP 108/68 | HR 80 | Temp 99.1°F | Resp 18 | Wt 220.0 lb

## 2016-09-07 DIAGNOSIS — J0111 Acute recurrent frontal sinusitis: Secondary | ICD-10-CM | POA: Diagnosis not present

## 2016-09-07 MED ORDER — AMOXICILLIN-POT CLAVULANATE 875-125 MG PO TABS: 1.0000 | ORAL_TABLET | Freq: Two times a day (BID) | ORAL | 0 refills | Status: DC

## 2016-09-07 NOTE — Progress Notes (Signed)
Randy Dean  MRN: [id number] DOB: 17-Sep-1965  Subjective:  HPI  Patient is here for follow up and discuss acute issue.  LOV for follow up was in January and was started on Zoloft due to not tolerating Paxil. Takes Zoloft every other night and this has helped some with anxiety and depression, not a lot. PHQ9 score was 12 on last visit and today its 14. Depression screen [facility] 2/9 09/07/2016 07/02/2016  Decreased Interest 1 0  Down, Depressed, Hopeless 0 0  PHQ - 2 Score 1 0  Altered sleeping 3 3  Tired, decreased energy 3 3  Change in appetite 2 1  Feeling bad or failure about yourself  1 1  Trouble concentrating 2 1  Moving slowly or fidgety/restless 2 3  Suicidal thoughts 0 0  PHQ-9 Score 14 12  Difficult doing work/chores - Very difficult   Patient is also been sick for 2 weeks. His wife had the flu around 08/26/16 and patient was treated prophylactic and finished it last week Monday he thinks-08/31/16. He did not feel too much better after that and kept getting worse actually. He came to see Adriana on 09/03/16 and was diagnosed with sinusitis and started on [place] which he is taking still and it gives him a pounding headache. Symptoms are not better and in fact feels worse. Patient Active Problem List   Diagnosis Date Noted  . Abnormality of gait 10/30/2015  . Headache 10/30/2015  . Anxiety and depression 11/23/2014  . Brain injuries ([place]) 11/23/2014  . Clinical depression 11/23/2014  . HLD (hyperlipidemia) 11/23/2014  . Insomnia due to medical condition 11/23/2014  . Injury of face and neck 11/23/2014  . Cervical pain 11/23/2014  . Loss of feeling or sensation 11/23/2014  . Tingling 11/23/2014  . Adiposity 11/23/2014  . Allergic rhinitis, seasonal 11/23/2014  . Neurosis, posttraumatic 11/23/2014  . Spinal stenosis in cervical region 11/23/2014  . Injury brain, traumatic ([place]) 11/23/2014  . Has a tremor 11/23/2014  . Disease of vein 11/23/2014  . Head revolving around  11/23/2014  . Shortness of breath 03/14/2013  . Anxiety 10/11/2008  . Intervertebral cervical disc disorder with myelopathy, cervical region 10/11/2008  . Musculoskeletal disorder and symptoms referable to neck 10/11/2008  . Acid reflux 10/11/2008    Past Medical History:  Diagnosis Date  . Abnormality of gait 10/30/2015  . Allergic rhinitis   . Chronic headaches    partially sinus/partially brain injury (per pt)  . GERD (gastroesophageal reflux disease)   . Head trauma 2010   has caused anxiety, memory issues and neck problems  . Headache 10/30/2015   Right temporal   . Neck stiffness    metal plate.  mild limitations of side to side and up and dowm movement  . Prediabetes   . Stroke [facility])    TIA x3- some residual right side weakness  . Vertigo   . Weakness of right side of body    from TIAs    Social History   Social History  . Marital status: Married    Spouse name: Lattie Haw  . Number of children: 0  . Years of education: 14   Occupational History  . Not on file.   Social History Main Topics  . Smoking status: Former Research scientist (life sciences)  . Smokeless tobacco: Current User    Last attempt to quit: 06/29/1986     Comment: 1 can dip/day  . Alcohol use No  . Drug use: No  . Sexual activity: Not  on file   Other Topics Concern  . Not on file   Social History Narrative   Lives at home with his wife Lattie Haw   Right-handed   Drinks tea or soda about 6 times per day    Outpatient Encounter Prescriptions as of 09/07/2016  Medication Sig  . acetaminophen (TYLENOL) 500 MG tablet Take 500 mg by mouth every 6 (six) hours as needed.  . ALPRAZolam (XANAX) 0.5 MG tablet Take 2 tablets approximately 45 minutes prior to the MRI study, take a third tablet if needed.  . cefdinir (OMNICEF) 300 MG capsule Take 1 capsule (300 mg total) by mouth 2 (two) times daily.  Marland Kitchen DEXILANT 60 MG capsule take 1 capsule by mouth once daily  . fluticasone (FLONASE) 50 MCG/ACT nasal spray Place 2 sprays into both  nostrils 2 (two) times daily.  Marland Kitchen LORazepam (ATIVAN) 1 MG tablet Take 1 tablet (1 mg total) by mouth 2 (two) times daily.  . meloxicam (MOBIC) 15 MG tablet take 1 tablet by mouth once daily  . Multiple Vitamin (MULTIVITAMIN) capsule Take 1 capsule by mouth daily. Reported on 09/25/2015  . Omega-3 Fatty Acids (FISH OIL) 1000 MG CAPS Take by mouth.  . sertraline (ZOLOFT) 50 MG tablet Take 1 tablet (50 mg total) by mouth daily.  Marland Kitchen topiramate (TOPAMAX) 25 MG tablet Take one tablet at night for one week, then take 2 tablets at night   No facility-administered encounter medications on file as of 09/07/2016.     Allergies  Allergen Reactions  . Serzone [Nefazodone]     Tongue and facial swelling  . Codeine Nausea Only    Dizziness, sweating  . Effexor [Venlafaxine]     "Feels out of it"  . Paxil [Paroxetine Hcl]     "does not feel right in the head"  . Esomeprazole Sodium Rash    Review of Systems  Constitutional: Positive for chills (really cold last night), fever (99.1) and malaise/fatigue.  HENT: Positive for congestion, ear pain (left ear the worst), sinus pain and sore throat.        Runny nose, post nasal drip  Respiratory: Positive for cough, sputum production and shortness of breath.   Musculoskeletal: Positive for back pain, joint pain (legs and knees), myalgias and neck pain.  Neurological: Positive for dizziness, weakness and headaches.  Psychiatric/Behavioral: Positive for depression. The patient is nervous/anxious and has insomnia.     Objective:  BP 108/68   Pulse 80   Temp 99.1 F (37.3 C)   Resp 18   Wt 220 lb (99.8 kg)   SpO2 96%   BMI 29.84 kg/m   Physical Exam  Assessment and Plan :  1. Acute recurrent frontal sinusitis Treat with Augmentin. 2.Depression/anxiety Not much better but patient wants to wait before making any medication changes. Re check on the next visit.  HPI, Exam and A&P transcribed under direction and in the presence of Miguel Aschoff,  MD. I have done the exam and reviewed the chart and it is accurate to the best of my knowledge. Development worker, community has been used and  any errors in dictation or transcription are unintentional. Miguel Aschoff M.D. [facility]

## 2016-09-14 ENCOUNTER — Ambulatory Visit: Payer: PPO | Admitting: Family Medicine

## 2016-10-21 ENCOUNTER — Encounter: Payer: Self-pay | Admitting: Family Medicine

## 2016-10-21 ENCOUNTER — Ambulatory Visit (INDEPENDENT_AMBULATORY_CARE_PROVIDER_SITE_OTHER): Payer: PPO | Admitting: Family Medicine

## 2016-10-21 ENCOUNTER — Telehealth: Payer: Self-pay

## 2016-10-21 VITALS — BP 118/70 | HR 76 | Temp 98.1°F | Resp 16 | Wt 218.0 lb

## 2016-10-21 DIAGNOSIS — K219 Gastro-esophageal reflux disease without esophagitis: Secondary | ICD-10-CM

## 2016-10-21 DIAGNOSIS — R7303 Prediabetes: Secondary | ICD-10-CM | POA: Diagnosis not present

## 2016-10-21 DIAGNOSIS — R55 Syncope and collapse: Secondary | ICD-10-CM

## 2016-10-21 DIAGNOSIS — M6281 Muscle weakness (generalized): Secondary | ICD-10-CM

## 2016-10-21 LAB — POCT URINALYSIS DIPSTICK
Bilirubin, UA: NEGATIVE
Blood, UA: NEGATIVE
Glucose, UA: NEGATIVE
Ketones, UA: NEGATIVE
Leukocytes, UA: NEGATIVE
Nitrite, UA: NEGATIVE
Protein, UA: NEGATIVE
Spec Grav, UA: 1.025 (ref 1.010–1.025)
Urobilinogen, UA: 0.2 E.U./dL
pH, UA: 6 (ref 5.0–8.0)

## 2016-10-21 LAB — POCT GLYCOSYLATED HEMOGLOBIN (HGB A1C)
Est. average glucose Bld gHb Est-mCnc: 111
Hemoglobin A1C: 5.5

## 2016-10-21 LAB — GLUCOSE, POCT (MANUAL RESULT ENTRY): POC Glucose: 110 mg/dl — AB (ref 70–99)

## 2016-10-21 MED ORDER — SUCRALFATE 1 G PO TABS: 1.0000 g | ORAL_TABLET | Freq: Three times a day (TID) | ORAL | 1 refills | Status: DC

## 2016-10-21 NOTE — Telephone Encounter (Signed)
Patient called saying that he has chronic neuro issues pertaining to his right arm and leg. However, over the last 4 days his symptoms have gotten worse. He reports that he has numbness and weakness in his right arm, and yesterday he had episodes where his legs "gave out" on him.   Patient also mentioned that his HR has been irregular, going up to at least 140s dropping down to 60s in a 56min period. Patient reports that he felt fatigued and light headed all day yesterday. He has not taken anything OTC for his symptoms. No recent changes have been made in his routine medications. Dr. Rosanna Randy was made aware, and appt was scheduled today at [facility] patient if symptoms continue to worsen, he would need to go to the ER for evaluation. Patient verbalized understanding.

## 2016-10-21 NOTE — Progress Notes (Addendum)
Patient: Randy Dean Male    DOB: [birth year]   51 y.o.   MRN: [id number] Visit Date: 10/21/2016  Today's Provider: [name], MD   Chief Complaint  Patient presents with  . Near Syncope   Subjective:    Near Syncope  This is a new problem. The current episode started yesterday. Associated symptoms include chest pain (not today; did have "sharp" left sided chest pain last week.), chills, diaphoresis, headaches, nausea, neck pain, numbness (right side of body) and weakness (legs). Pertinent negatives include no abdominal pain, fever or vomiting. Exacerbated by: unknown.  Pt notes tinnitus, neck pain/stiffness, leg weakness (and lower back pain). Pt also reports his heart rate has been unpredictably ranging from 60-146. Pt has a H/O head injury. Pt also notes leg cramps. Pt is unsure of water intake.  Pt states he has had rare episodes of near syncope for almost a year, but this is the worst it has been.     Allergies  Allergen Reactions  . Serzone [Nefazodone]     Tongue and facial swelling  . Codeine Nausea Only    Dizziness, sweating  . Effexor [Venlafaxine]     "Feels out of it"  . Paxil [Paroxetine Hcl]     "does not feel right in the head"  . Esomeprazole Sodium Rash     Current Outpatient Prescriptions:  .  acetaminophen (TYLENOL) 500 MG tablet, Take 500 mg by mouth every 6 (six) hours as needed., Disp: , Rfl:  .  DEXILANT 60 MG capsule, take 1 capsule by mouth once daily, Disp: 30 capsule, Rfl: 12 .  fluticasone (FLONASE) 50 MCG/ACT nasal spray, Place 2 sprays into both nostrils 2 (two) times daily., Disp: 32 g, Rfl: 12 .  loratadine (CLARITIN) 10 MG tablet, Take 10 mg by mouth daily., Disp: , Rfl:  .  LORazepam (ATIVAN) 1 MG tablet, Take 1 tablet (1 mg total) by mouth 2 (two) times daily. (Patient taking differently: Take 1 mg by mouth 2 (two) times daily as needed. ), Disp: 20 tablet, Rfl: 0 .  meloxicam (MOBIC) 15 MG tablet, take 1 tablet by mouth  once daily (Patient taking differently: take 1 tablet by mouth once daily PRN), Disp: 30 tablet, Rfl: 12 .  sertraline (ZOLOFT) 50 MG tablet, Take 1 tablet (50 mg total) by mouth daily. (Patient taking differently: Take 50 mg by mouth daily as needed. ), Disp: 90 tablet, Rfl: 3 .  Multiple Vitamin (MULTIVITAMIN) capsule, Take 1 capsule by mouth daily. Reported on 09/25/2015, Disp: , Rfl:  .  Omega-3 Fatty Acids (FISH OIL) 1000 MG CAPS, Take by mouth., Disp: , Rfl:  .  topiramate (TOPAMAX) 25 MG tablet, Take one tablet at night for one week, then take 2 tablets at night, Disp: 60 tablet, Rfl: 3  Review of Systems  Constitutional: Positive for chills and diaphoresis. Negative for fever.  Eyes: Negative.   Respiratory: Negative for shortness of breath.   Cardiovascular: Positive for chest pain (not today; did have "sharp" left sided chest pain last week.), palpitations, leg swelling and near-syncope.  Gastrointestinal: Positive for nausea. Negative for abdominal pain and vomiting.  Endocrine: Negative.   Genitourinary: Negative.   Musculoskeletal: Positive for neck pain and neck stiffness.  Allergic/Immunologic: Negative.   Neurological: Positive for weakness (legs), light-headedness, numbness (right side of body) and headaches. Negative for syncope.  Psychiatric/Behavioral: Negative.     Social History  Substance Use Topics  . Smoking status:  Former Smoker  . Smokeless tobacco: Current User    Last attempt to quit: 06/29/1986     Comment: 1 can dip/day  . Alcohol use No   Objective:   BP 118/70 (BP Location: Left Arm, Patient Position: Sitting, Cuff Size: Large)   Pulse 76   Temp 98.1 F (36.7 C) (Oral)   Resp 16   Wt 218 lb (98.9 kg)   BMI 29.57 kg/m  Vitals:   10/21/16 1009  BP: 118/70  Pulse: 76  Resp: 16  Temp: 98.1 F (36.7 C)  TempSrc: Oral  Weight: 218 lb (98.9 kg)     Physical Exam  Constitutional: He appears well-developed and well-nourished.  HENT:  Head:  Normocephalic and atraumatic.  Right Ear: External ear normal.  Left Ear: External ear normal.  Nose: Nose normal.  Eyes: Conjunctivae are normal. Pupils are equal, round, and reactive to light. Right eye exhibits no discharge. Left eye exhibits no discharge.  Neck: Neck supple.  Cardiovascular: Normal rate, regular rhythm and normal heart sounds.   Pulmonary/Chest: Effort normal and breath sounds normal. No respiratory distress.  Abdominal: Soft. There is no tenderness.  Musculoskeletal:  Weakness of grip, triceps and biceps of right arm. Pain with leg raises.  Lymphadenopathy:    He has no cervical adenopathy.  Neurological: No cranial nerve deficit.  Left esoptopia.Tremor right arm /hand. Some difficulty with arising from chair and some unsteady gait.  Skin: Skin is warm and dry.  Psychiatric: He has a normal mood and affect. His behavior is normal. Judgment and thought content normal.        Assessment & Plan:     1. Near syncope EKG WNL and blood sugar is normal. Pt recently had negative cardiac workup by Dr. Nehemiah Massed. Due to H/O head injury and other neurological problems, believe this is caused by neuro issues. Encouraged pt to FU with neuro, but he refused due to financial problems. Will check other labs. FU pending results. - EKG 12-Lead - POCT urinalysis dipstick - POCT glucose (manual entry) - CBC with Differential/Platelet - TSH - Comprehensive metabolic panel Results for orders placed or performed in visit on 10/21/16  POCT urinalysis dipstick  Result Value Ref Range   Color, UA amber    Clarity, UA clear    Glucose, UA Negative    Bilirubin, UA Negative    Ketones, UA Negative    Spec Grav, UA 1.025 1.010 - 1.025   Blood, UA Negative    pH, UA 6.0 5.0 - 8.0   Protein, UA Negative    Urobilinogen, UA 0.2 0.2 or 1.0 E.U./dL   Nitrite, UA Negative    Leukocytes, UA Negative Negative  POCT glucose (manual entry)  Result Value Ref Range   POC Glucose 110 (A)  70 - 99 mg/dl  POCT glycosylated hemoglobin (Hb A1C)  Result Value Ref Range   Hemoglobin A1C 5.5    Est. average glucose Bld gHb Est-mCnc 111     2. Gastroesophageal reflux disease, esophagitis presence not specified Worsening. Most likely cause of chest pain. Start Carafate as below. FU 2 months. - sucralfate (CARAFATE) 1 g tablet; Take 1 tablet (1 g total) by mouth 4 (four) times daily -  with meals and at bedtime.  Dispense: 120 tablet; Refill: 1  3. Prediabetes 5.5% today.  - POCT glycosylated hemoglobin (Hb A1C)  4. Muscle weakness Check labs and FU pending results. - Sedimentation rate - CK  5.TBI He really needs neurology follow up.  Patient seen and examined by Miguel Aschoff, MD, and note scribed by Renaldo Fiddler, CMA. I have done the exam and reviewed the above chart and it is accurate to the best of my knowledge. Development worker, community has been used in this note in any air is in the dictation or transcription are unintentional.  [name], MD  [place]

## 2016-10-22 LAB — COMPREHENSIVE METABOLIC PANEL
ALT: 17 IU/L (ref 0–44)
AST: 18 IU/L (ref 0–40)
Albumin/Globulin Ratio: 1.6 (ref 1.2–2.2)
Albumin: 4 g/dL (ref 3.5–5.5)
Alkaline Phosphatase: 71 IU/L (ref 39–117)
BUN/Creatinine Ratio: 8 — ABNORMAL LOW (ref 9–20)
BUN: 14 mg/dL (ref 6–24)
Bilirubin Total: 0.4 mg/dL (ref 0.0–1.2)
CO2: 28 mmol/L (ref 18–29)
Calcium: 9.1 mg/dL (ref 8.7–10.2)
Chloride: 103 mmol/L (ref 96–106)
Creatinine, Ser: 1.67 mg/dL — ABNORMAL HIGH (ref 0.76–1.27)
GFR calc Af Amer: 54 mL/min/{1.73_m2} — ABNORMAL LOW (ref >59)
GFR calc non Af Amer: 46 mL/min/{1.73_m2} — ABNORMAL LOW (ref >59)
Globulin, Total: 2.5 g/dL (ref 1.5–4.5)
Glucose: 98 mg/dL (ref 65–99)
Potassium: 4.6 mmol/L (ref 3.5–5.2)
Sodium: 142 mmol/L (ref 134–144)
Total Protein: 6.5 g/dL (ref 6.0–8.5)

## 2016-10-22 LAB — CBC WITH DIFFERENTIAL/PLATELET
Basophils Absolute: 0 10*3/uL (ref 0.0–0.2)
Basos: 0 %
EOS (ABSOLUTE): 0.1 10*3/uL (ref 0.0–0.4)
Eos: 2 %
Hematocrit: 42.7 % (ref 37.5–51.0)
Hemoglobin: 15 g/dL (ref 13.0–17.7)
Immature Grans (Abs): 0 10*3/uL (ref 0.0–0.1)
Immature Granulocytes: 0 %
Lymphocytes Absolute: 1.6 10*3/uL (ref 0.7–3.1)
Lymphs: 29 %
MCH: 30.9 pg (ref 26.6–33.0)
MCHC: 35.1 g/dL (ref 31.5–35.7)
MCV: 88 fL (ref 79–97)
Monocytes Absolute: 0.5 10*3/uL (ref 0.1–0.9)
Monocytes: 10 %
Neutrophils Absolute: 3.2 10*3/uL (ref 1.4–7.0)
Neutrophils: 59 %
Platelets: 172 10*3/uL (ref 150–379)
RBC: 4.85 x10E6/uL (ref 4.14–5.80)
RDW: 13.7 % (ref 12.3–15.4)
WBC: 5.4 10*3/uL (ref 3.4–10.8)

## 2016-10-22 LAB — CK: Total CK: 419 U/L — ABNORMAL HIGH (ref 24–204)

## 2016-10-22 LAB — TSH: TSH: 1.35 u[IU]/mL (ref 0.450–4.500)

## 2016-10-22 LAB — SEDIMENTATION RATE: Sed Rate: 2 mm/hr (ref 0–30)

## 2016-10-27 ENCOUNTER — Telehealth: Payer: Self-pay | Admitting: Family Medicine

## 2016-10-27 NOTE — Telephone Encounter (Signed)
Pt states he is still having weakness in both legs and falling due to the weakness.  Pt is requesting a Rx for a rollator walker. Pt states per insurance this will need a prior authorization.   [facility] [place].  [phone]

## 2016-10-27 NOTE — Telephone Encounter (Signed)
Pt advised-aa 

## 2016-10-27 NOTE — Telephone Encounter (Signed)
lmtcb, RX ready for pick up. Patient needs to take it to a medical supply and they will process it.-aa

## 2016-10-27 NOTE — Telephone Encounter (Signed)
Please review. Ok to [employer] and then see if they need an office note? Would last office note be sufficient maybe?-aa

## 2016-10-27 NOTE — Telephone Encounter (Signed)
Pt advised/MW

## 2016-10-27 NOTE — Telephone Encounter (Signed)
Possibly

## 2016-12-02 ENCOUNTER — Encounter: Payer: Self-pay | Admitting: Neurology

## 2016-12-02 ENCOUNTER — Ambulatory Visit (INDEPENDENT_AMBULATORY_CARE_PROVIDER_SITE_OTHER): Payer: PPO | Admitting: Neurology

## 2016-12-02 VITALS — BP 119/70 | HR 58 | Ht 72.0 in | Wt 211.5 lb

## 2016-12-02 DIAGNOSIS — R413 Other amnesia: Secondary | ICD-10-CM

## 2016-12-02 DIAGNOSIS — R269 Unspecified abnormalities of gait and mobility: Secondary | ICD-10-CM

## 2016-12-02 MED ORDER — ALPRAZOLAM 0.5 MG PO TABS: ORAL_TABLET | ORAL | 0 refills | Status: DC

## 2016-12-02 MED ORDER — TRAMADOL HCL 50 MG PO TABS: 50.0000 mg | ORAL_TABLET | Freq: Four times a day (QID) | ORAL | 1 refills | Status: DC | PRN

## 2016-12-02 NOTE — Progress Notes (Signed)
Reason for visit: Gait disorder  Randy Dean is an 51 y.o. male  History of present illness:  Randy Dean is a 51 year old right-handed white male with a history of a prior closed head injury and cervical spine surgery. The patient claims that he has had some problems with worsening gait and headaches. The patient was last seen about a year ago, MRI of the brain and cervical spine was to be set up, but this was never done as the patient indicates that he cannot afford the studies. The patient has had nonorganic features to his clinical examination previously with splitting the midline of vibration sensation on the forehead, decreased pinprick sensation on the right side of the body, and what appears to be a factitious tremor that will come and go. The patient indicates that the headaches have gotten better, he was placed on Topamax but he is no longer taking the medication. He may have one headache every month or so. He claims that his walking really has not changed since the last year, he will have episodes where he feels weak on the right side of the body and the left leg and it may take him 2 to 3 weeks to get over this episode. He has not had any falls, he does not use a cane for ambulation. He reports chronic constipation issues and some urinary urgency. He continues to have some memory issues that have not worsened over time. The patient reports that if he does any physical activity he has significant neuromuscular discomfort that occurs for several days afterwards. He takes Ultram on occasion if needed. The patient has gone off of most of his medications. The patient states that when he tries to look up he may feel as if he is going to pass out. He has not lost consciousness. He returns for an evaluation.  Past Medical History:  Diagnosis Date  . Abnormality of gait 10/30/2015  . Allergic rhinitis   . Chronic headaches    partially sinus/partially brain injury (per pt)  . GERD  (gastroesophageal reflux disease)   . Head trauma 2010   has caused anxiety, memory issues and neck problems  . Headache 10/30/2015   Right temporal   . Neck stiffness    metal plate.  mild limitations of side to side and up and dowm movement  . Prediabetes   . Stroke [facility])    TIA x3- some residual right side weakness  . Vertigo   . Weakness of right side of body    from TIAs    Past Surgical History:  Procedure Laterality Date  . BACK SURGERY    . ENDOSCOPIC TURBINATE REDUCTION Bilateral 08/22/2015   Procedure: ENDOSCOPIC TURBINATE REDUCTION;  Surgeon: Margaretha Sheffield, MD;  Location: [place];  Service: ENT;  Laterality: Bilateral;  . EYE MUSCLE SURGERY Left    Due to conential left amblyopia  . NASAL SINUS SURGERY  september 2016  . NECK SURGERY      Family History  Problem Relation Age of Onset  . Fibromyalgia Mother   . Heart disease Father   . Tremor Father   . Diabetes Maternal Grandmother   . Hypertension Maternal Grandmother   . Hypertension Maternal Grandfather   . Diabetes Paternal Grandmother   . Hypertension Paternal Grandmother   . Hypertension Paternal Grandfather     Social history:  reports that he quit smoking about 32 years ago. He has a 1.50 pack-year smoking history. He uses smokeless tobacco.  He reports that he does not drink alcohol or use drugs.    Allergies  Allergen Reactions  . Serzone [Nefazodone]     Tongue and facial swelling  . Codeine Nausea Only    Dizziness, sweating  . Effexor [Venlafaxine]     "Feels out of it"  . Paxil [Paroxetine Hcl]     "does not feel right in the head"  . Esomeprazole Sodium Rash    Medications:  Prior to Admission medications   Medication Sig Start Date End Date Taking? Authorizing Provider  acetaminophen (TYLENOL) 500 MG tablet Take 500 mg by mouth every 6 (six) hours as needed.   Yes [provider]  DEXILANT 60 MG capsule take 1 capsule by mouth once daily 08/20/16  Yes Jerrol Banana., MD  fluticasone [facility]) 50 MCG/ACT nasal spray Place 2 sprays into both nostrils 2 (two) times daily. 03/31/16  Yes Jerrol Banana., MD  loratadine (CLARITIN) 10 MG tablet Take 10 mg by mouth daily.   Yes [provider]  meloxicam (MOBIC) 15 MG tablet take 1 tablet by mouth once daily Patient taking differently: take 1 tablet by mouth once daily PRN 03/03/16  Yes Jerrol Banana., MD  Omega-3 Fatty Acids (FISH OIL) 1000 MG CAPS Take by mouth.   Yes [provider]  LORazepam (ATIVAN) 1 MG tablet Take 1 tablet (1 mg total) by mouth 2 (two) times daily. Patient not taking: Reported on 12/02/2016 12/26/15 12/25/16  Earleen Newport, MD  sertraline (ZOLOFT) 50 MG tablet Take 1 tablet (50 mg total) by mouth daily. Patient not taking: Reported on 12/02/2016 07/02/16   Jerrol Banana., MD  sucralfate (CARAFATE) 1 g tablet Take 1 tablet (1 g total) by mouth 4 (four) times daily -  with meals and at bedtime. Patient not taking: Reported on 12/02/2016 10/21/16   Jerrol Banana., MD  topiramate (TOPAMAX) 25 MG tablet Take one tablet at night for one week, then take 2 tablets at night Patient not taking: Reported on 12/02/2016 10/30/15   Kathrynn Ducking, MD    ROS:  Out of a complete 14 system review of symptoms, the patient complains only of the following symptoms, and all other reviewed systems are negative.  Gait disorder  Blood pressure 119/70, pulse (!) 58, height 6' (1.829 m), weight 211 lb 8 oz (95.9 kg).  Physical Exam  General: The patient is alert and cooperative at the time of the examination.  Skin: No significant peripheral edema is noted.   Neurologic Exam  Mental status: The patient is alert and oriented x 3 at the time of the examination. The patient has apparent normal recent and remote memory, with an apparently normal attention span and concentration ability.   Cranial nerves: Facial symmetry is present. Speech is normal,  no aphasia or dysarthria is noted. Extraocular movements are full, the patient does have exotropia of the left eye on primary gaze. Visual fields are full.  Motor: The patient has good strength in all 4 extremities, there is some giveaway weakness with the right leg.  Sensory examination: Pinprick sensation is decreased on the right face as compared to the left, the patient does not split midline with vibration sensation today. The patient has symmetric pinprick sensation on the legs, decreased pinprick sensation on the left arm as compared to the right. Vibration sensation on the legs is symmetric, decreased on the right arm as compared to left.  Coordination: The  patient has good finger-nose-finger and heel-to-shin bilaterally. The patient has an action tremor and resting tremor on the right arm that will come and go, appears to be distractible.  Gait and station: The patient has a slightly wide-based gait. The patient holds the right arm in slight flexion, decreased arm swing on the right. Tandem gait is slightly unsteady. Romberg is negative. No drift is seen.  Reflexes: Deep tendon reflexes are symmetric, slightly depressed throughout.   Assessment/Plan:  1. History of closed head injury  2. Reported memory disturbance  3. Gait disturbance  4. Prior cervical spine surgery  5. Neuromuscular discomfort  6. Headaches, resolved  7. Right upper extremity tremor  The patient will once again be set up for MRI of the brain and cervical spine. He does not wish to go on medication such as Lyrica or gabapentin for neuromuscular discomfort. The patient will follow-up in 6 months, sooner if needed.  Jill Alexanders MD 12/02/2016 8:14 AM  [facility] [address] [place], [place] [address]  Phone [phone] Fax [phone]

## 2016-12-02 NOTE — Patient Instructions (Signed)
  We will check MRI of the brain and cervical spine.

## 2016-12-07 ENCOUNTER — Telehealth: Payer: Self-pay | Admitting: Neurology

## 2016-12-07 NOTE — Telephone Encounter (Signed)
Patient calling to schedule MRI.

## 2016-12-07 NOTE — Telephone Encounter (Signed)
Patient called back and informed me that he is going to try the mobile unit at [facility] because they said it is bigger.. I told him that I would re do the authorization once it has been approved then I can call and schedule the mri. Patient understood.

## 2016-12-07 NOTE — Telephone Encounter (Signed)
I called his insurance on Friday 12/04/16 and they said it was still pending the redo authorization.. I called this morning 12/07/16 she told me it is still pending and they have until 12/17/16 to hear if it was re approved.. She told me that [facility] the location I put on is not in their network and that is why the turn around time is longer.. I had [place] [facility] as the location for the MRI, but he informed me that he needed an open MRI and I told him the only one that had a true open mri was [facility] and since [facility] is not in his net work we have to redo the authorization again.. I called the patient to inform him of this and left him a voicemail for him to call me back.Randy Dean

## 2016-12-07 NOTE — Telephone Encounter (Signed)
I called the patient to informed him [facility] is not in his net work and they are the only ones that have a true open MRI. He informed me that he is not going to be able to have the MRI in a regular machine and feels like he needs to be fully sedated for the MRI. I told him I would put a message back to Dr. Jannifer Franklin to see if he is okay with him being sedated  and then if so we will re do the authorization for [facility] since they are the ones that do the sediated mri's.

## 2016-12-07 NOTE — Telephone Encounter (Signed)
I called the patient. The patient does have alprazolam to take for the scan [place] I indicated that the cost of consciousness sedation with the MRI would be much greater because it would require that the study be done in the hospital with an anesthesiologist.  The patient is not sure he wants to have this done, he will call [facility] to find out if they have a larger scanner. He will contact us.

## 2016-12-09 ENCOUNTER — Telehealth: Payer: Self-pay | Admitting: Neurology

## 2016-12-09 NOTE — Telephone Encounter (Signed)
Patient is scheduled at [facility] on the Mobile unit for 12/17/16 at 9:30 AM. Patient is aware. The health team auth is [id number] (exp. 12/07/16 to 03/07/17).

## 2016-12-09 NOTE — Telephone Encounter (Signed)
I have the approval for the MRI's and I called the patient to schedule his images and left him a voicemail for him to call me back about scheduling it at [facility] on their mobile unit.

## 2016-12-09 NOTE — Telephone Encounter (Signed)
Pt returned your call and would like a call back .. °

## 2016-12-17 ENCOUNTER — Ambulatory Visit: Payer: PPO

## 2016-12-18 ENCOUNTER — Ambulatory Visit
Admission: RE | Admit: 2016-12-18 | Discharge: 2016-12-18 | Disposition: A | Discharge status: Home / Self Care | Payer: PPO | Source: Ambulatory Visit | Attending: Neurology | Admitting: Neurology

## 2016-12-18 ENCOUNTER — Telehealth: Payer: Self-pay | Admitting: Neurology

## 2016-12-18 DIAGNOSIS — G9589 Other specified diseases of spinal cord: Secondary | ICD-10-CM | POA: Diagnosis not present

## 2016-12-18 DIAGNOSIS — R413 Other amnesia: Secondary | ICD-10-CM | POA: Insufficient documentation

## 2016-12-18 DIAGNOSIS — R269 Unspecified abnormalities of gait and mobility: Secondary | ICD-10-CM

## 2016-12-18 DIAGNOSIS — R2689 Other abnormalities of gait and mobility: Secondary | ICD-10-CM | POA: Diagnosis not present

## 2016-12-18 NOTE — Telephone Encounter (Signed)
I called patient. The MRI the brain is unremarkable, MRI the cervical spine shows postsurgical changes with evidence of myelomalacia the spinal cord at the C4-5 level, this may result in a chronic gait disorder, no change from 2010.   MRI brain 12/18/16:  IMPRESSION: Negative MRI head head. No change from 2012   MRI cervical 12/18/16:  IMPRESSION: ACDF C4 through C7 with artifact. Posterior decompression C3 through C7. Chronic myelomalacia in the central cord bilaterally at C4-5. Overall no change from 2010

## 2016-12-21 MED ORDER — NORTRIPTYLINE HCL 10 MG PO CAPS: ORAL_CAPSULE | ORAL | 3 refills | Status: DC

## 2016-12-21 NOTE — Telephone Encounter (Signed)
I called the patient. I went over the results of the MRI study within. He complains of ongoing daily pain, we may give a trial on nortriptyline at night. He did not want to go on gabapentin or Lyrica as this makes him too drowsy during the day.

## 2016-12-21 NOTE — Addendum Note (Signed)
Addended by: Kathrynn Ducking on: 12/21/2016 09:25 AM   Modules accepted: Orders

## 2016-12-21 NOTE — Telephone Encounter (Signed)
Patient called office in reference to MRI results.  Patient states on Friday he was still coming off of the sedation from having MRI done.  Patient requesting a call back to discuss MRI results again please.  Please call

## 2016-12-23 ENCOUNTER — Telehealth: Payer: Self-pay | Admitting: Neurology

## 2016-12-23 ENCOUNTER — Ambulatory Visit: Payer: PPO | Admitting: Family Medicine

## 2016-12-23 DIAGNOSIS — G959 Disease of spinal cord, unspecified: Secondary | ICD-10-CM

## 2016-12-23 DIAGNOSIS — G894 Chronic pain syndrome: Secondary | ICD-10-CM

## 2016-12-23 NOTE — Telephone Encounter (Signed)
Patient called office to inform Dr. Jannifer Franklin he would like to process with Pain management referral to [facility] ([address]) Dr. Hardin Negus.

## 2016-12-23 NOTE — Telephone Encounter (Signed)
The patient is called and has requested a pain center referral to see Dr. Hardin Negus, I will make the referral. In the past, the patient has not been willing to take a lot of medication for his neuromuscular discomfort.  He does have myelomalacia in the cervical spine that has resulted in a chronic gait disorder.

## 2016-12-23 NOTE — Addendum Note (Signed)
Addended by: Kathrynn Ducking on: 12/23/2016 05:12 PM   Modules accepted: Orders

## 2016-12-28 DIAGNOSIS — R296 Repeated falls: Secondary | ICD-10-CM | POA: Diagnosis not present

## 2016-12-28 DIAGNOSIS — R531 Weakness: Secondary | ICD-10-CM | POA: Diagnosis not present

## 2016-12-28 DIAGNOSIS — M6281 Muscle weakness (generalized): Secondary | ICD-10-CM | POA: Diagnosis not present

## 2017-01-18 ENCOUNTER — Ambulatory Visit: Payer: PPO | Admitting: Family Medicine

## 2017-01-20 ENCOUNTER — Ambulatory Visit: Payer: Self-pay | Admitting: Family Medicine

## 2017-01-21 ENCOUNTER — Ambulatory Visit (INDEPENDENT_AMBULATORY_CARE_PROVIDER_SITE_OTHER): Payer: PPO | Admitting: Family Medicine

## 2017-01-21 VITALS — BP 116/60 | HR 68 | Temp 97.7°F | Resp 16 | Wt 215.0 lb

## 2017-01-21 DIAGNOSIS — G542 Cervical root disorders, not elsewhere classified: Secondary | ICD-10-CM | POA: Diagnosis not present

## 2017-01-21 MED ORDER — GABAPENTIN 100 MG PO CAPS: 100.0000 mg | ORAL_CAPSULE | Freq: Three times a day (TID) | ORAL | 5 refills | Status: DC

## 2017-01-21 NOTE — Patient Instructions (Signed)
Start Gabapentin 100 mg and take 1 tablet at bedtime for 1 week then increase to 2 tablets at bedtime for 1 week and then take 1 tablet in the morning and 2 tablets in the evening and continue this dose then.

## 2017-01-21 NOTE — Progress Notes (Signed)
Randy Dean  MRN: [id number] DOB: [birth year]  Subjective:  HPI   The patient is a 51 year old male who presents for follow up.  However he states he is very frustrated and feels that he has had several complaints that he has seen several physicians about but has not gotten any answers.  Blurred vision-States he gets a film over his eyes that when he can clear it the vision is better.  He has recently seen his eye doctor and was told there were no changes in his vision.  He takes allergy medication but this does not seem to be helping.  Tingling on the side and top of his head that he says is getting worse.  Pain from his neck, right side, down his arm into his hand and fingers.  He also has this pain in the left hand.  Soreness and weakness of the right leg.  He state that when he does something like walk across the parking lot he has to stop and rest because his legs tire out.  Tinnitus-Patient has seen ENT and was told there had been no changes in his hearing.  However he states that the ringing gets so loud that sound and voices are muffled and he has trouble being able to tell what is being said.   Patient said he has a lot of trouble verbalizing his thoughts.  He gets very frustrated with this and that makes him tired and down.    He denies depression, however he scored 13 on his PHQ9.  It is noted that he has not been taking his Sertraline.  When asked why he states that he doesn't know what all his medicines are for and sometimes gets confused if it is something he is supposed to take or if it is old medicine.    The patient asked for opinion on going to the pain clinic as was recommend by another physician.  The patient however is not taking his Tramadol for pain.  When asked why he said it made him groggy and he did not like that feeling.    Patient Active Problem List   Diagnosis Date Noted  . Prediabetes 10/21/2016  . Abnormality of gait 10/30/2015  . Headache  10/30/2015  . Anxiety and depression 11/23/2014  . Brain injuries ([place]) 11/23/2014  . Clinical depression 11/23/2014  . HLD (hyperlipidemia) 11/23/2014  . Insomnia due to medical condition 11/23/2014  . Injury of face and neck 11/23/2014  . Cervical pain 11/23/2014  . Loss of feeling or sensation 11/23/2014  . Tingling 11/23/2014  . Adiposity 11/23/2014  . Allergic rhinitis, seasonal 11/23/2014  . Neurosis, posttraumatic 11/23/2014  . Spinal stenosis in cervical region 11/23/2014  . Injury brain, traumatic ([place]) 11/23/2014  . Has a tremor 11/23/2014  . Disease of vein 11/23/2014  . Head revolving around 11/23/2014  . Shortness of breath 03/14/2013  . Anxiety 10/11/2008  . Intervertebral cervical disc disorder with myelopathy, cervical region 10/11/2008  . Musculoskeletal disorder and symptoms referable to neck 10/11/2008  . Acid reflux 10/11/2008    Past Medical History:  Diagnosis Date  . Abnormality of gait 10/30/2015  . Allergic rhinitis   . Chronic headaches    partially sinus/partially brain injury (per pt)  . GERD (gastroesophageal reflux disease)   . Head trauma 2010   has caused anxiety, memory issues and neck problems  . Headache 10/30/2015   Right temporal   . Neck stiffness    metal plate.  mild limitations of side to side and up and dowm movement  . Prediabetes   . Stroke [facility])    TIA x3- some residual right side weakness  . Vertigo   . Weakness of right side of body    from TIAs    Social History   Social History  . Marital status: Married    Spouse name: Lattie Haw  . Number of children: 0  . Years of education: 14   Occupational History  . Not on file.   Social History Main Topics  . Smoking status: Former Smoker    Packs/day: 0.50    Years: 3.00    Quit date: 06/28/1984  . Smokeless tobacco: Current User    Last attempt to quit: 06/29/1986     Comment: 1 can dip/day  . Alcohol use No  . Drug use: No  . Sexual activity: Not on file   Other  Topics Concern  . Not on file   Social History Narrative   Lives at home with his wife Lattie Haw   Right-handed   Drinks tea or soda about 6 times per day    Outpatient Encounter Prescriptions as of 01/21/2017  Medication Sig  . acetaminophen (TYLENOL) 500 MG tablet Take 500 mg by mouth every 6 (six) hours as needed.  . ALPRAZolam (XANAX) 0.5 MG tablet Take 2 tablets approximately 45 minutes prior to the MRI study, take a third tablet if needed.  Marland Kitchen DEXILANT 60 MG capsule take 1 capsule by mouth once daily  . fluticasone (FLONASE) 50 MCG/ACT nasal spray Place 2 sprays into both nostrils 2 (two) times daily.  Marland Kitchen loratadine (CLARITIN) 10 MG tablet Take 10 mg by mouth daily.  . meloxicam (MOBIC) 15 MG tablet take 1 tablet by mouth once daily (Patient taking differently: take 1 tablet by mouth once daily PRN)  . nortriptyline (PAMELOR) 10 MG capsule Take one capsule at night for one week, then take 2 capsules at night for one week, then take 3 capsules at night  . Omega-3 Fatty Acids (FISH OIL) 1000 MG CAPS Take by mouth.  . sucralfate (CARAFATE) 1 g tablet Take 1 tablet (1 g total) by mouth 4 (four) times daily -  with meals and at bedtime.  . sertraline (ZOLOFT) 50 MG tablet Take 1 tablet (50 mg total) by mouth daily. (Patient not taking: Reported on 01/21/2017)  . traMADol (ULTRAM) 50 MG tablet Take 1 tablet (50 mg total) by mouth every 6 (six) hours as needed. (Patient not taking: Reported on 01/21/2017)   No facility-administered encounter medications on file as of 01/21/2017.     Allergies  Allergen Reactions  . Serzone [Nefazodone]     Tongue and facial swelling  . Codeine Nausea Only    Dizziness, sweating  . Effexor [Venlafaxine]     "Feels out of it"  . Paxil [Paroxetine Hcl]     "does not feel right in the head"  . Esomeprazole Sodium Rash   Depression screen [facility] 2/9 01/21/2017 09/07/2016 07/02/2016  Decreased Interest 1 1 0  Down, Depressed, Hopeless 1 0 0  PHQ - 2 Score 2 1 0    Altered sleeping 3 3 3   Tired, decreased energy 3 3 3   Change in appetite 2 2 1   Feeling bad or failure about yourself  0 1 1  Trouble concentrating 3 2 1   Moving slowly or fidgety/restless 0 2 3  Suicidal thoughts 0 0 0  PHQ-9 Score 13 14 12   Difficult doing  work/chores - - Very difficult    Review of Systems  Constitutional: Positive for chills, diaphoresis and malaise/fatigue. Negative for fever.  HENT: Positive for congestion (head) and sinus pain.   Eyes: Negative.   Respiratory: Positive for cough. Negative for sputum production and wheezing.   Cardiovascular: Negative for chest pain, palpitations and orthopnea.  Gastrointestinal: Negative.   Musculoskeletal: Positive for back pain, myalgias and neck pain.  Skin: Negative.   Neurological: Positive for tingling, sensory change, weakness and headaches.  Psychiatric/Behavioral: Positive for memory loss. Negative for depression, hallucinations, substance abuse and suicidal ideas. The patient is nervous/anxious (Frustration) and has insomnia.     Objective:  BP 116/60 (BP Location: Right Arm, Patient Position: Sitting, Cuff Size: Normal)   Pulse 68   Temp 97.7 F (36.5 C) (Oral)   Resp 16   Wt 215 lb (97.5 kg)   BMI 29.16 kg/m   Physical Exam  Constitutional: He is oriented to person, place, and time and well-developed, well-nourished, and in no distress.  HENT:  Head: Normocephalic.  Eyes: Conjunctivae are normal. No scleral icterus.  Neck: No thyromegaly present.  Cardiovascular: Normal rate, regular rhythm and normal heart sounds.   Pulmonary/Chest: Effort normal and breath sounds normal.  Abdominal: Soft.  Neurological: He is alert and oriented to person, place, and time. Coordination abnormal. GCS score is 15.  "Stiff ' on right side with movement.  Skin: Skin is warm and dry.  Psychiatric: Mood, memory, affect and judgment normal.    Assessment and Plan :  1. Cervical neuropathy Start Gapabentin 100 mg. Re  check in 1 month. 2.TBI 3.Depression/GAD 4.GERD  I have done the exam and reviewed the chart and it is accurate to the best of my knowledge. Development worker, community has been used and  any errors in dictation or transcription are unintentional. Miguel Aschoff M.D. [facility]

## 2017-01-29 DIAGNOSIS — H02413 Mechanical ptosis of bilateral eyelids: Secondary | ICD-10-CM | POA: Diagnosis not present

## 2017-02-10 ENCOUNTER — Telehealth: Payer: Self-pay | Admitting: Family Medicine

## 2017-02-10 MED ORDER — NYSTATIN 100000 UNIT/ML MT SUSP: 5.0000 mL | Freq: Four times a day (QID) | OROMUCOSAL | 0 refills | Status: DC

## 2017-02-10 NOTE — Telephone Encounter (Signed)
Please review-aa 

## 2017-02-10 NOTE — Telephone Encounter (Signed)
Pt would like something called in for oral thrush.  He has been taking an antibiotic he had at home for a sinus infection  [facility] in [place]  Call back is [phone]  Con Memos

## 2017-02-10 NOTE — Telephone Encounter (Signed)
Nystatin 5 cc swish/gargle and spit QID,#120cc

## 2017-02-10 NOTE — Telephone Encounter (Signed)
RX sent in to the pharmacy, lmtcb for patient-aa

## 2017-02-11 NOTE — Telephone Encounter (Signed)
Pt advised-aa 

## 2017-02-16 ENCOUNTER — Telehealth: Payer: Self-pay | Admitting: Neurology

## 2017-02-16 NOTE — Telephone Encounter (Signed)
I called patient. The patient reports tingling on his head, arms and legs, he has had decreasing sense of smell over the last 2 weeks, he denies any sinus pressure or significant sinus drainage.  MRI the brain done previously was unremarkable, he does have evidence of a cervical myelopathy which could explain gait problems and sensory alterations on all 4 extremities.  The patient was placed on nortriptyline for his headaches and pain, he went off the medication for some reason, he is to restart the medication and stay on it.

## 2017-02-16 NOTE — Telephone Encounter (Signed)
Pt calling to inform that his condition is worsening, pt is asking for a call back to find out all of the test that were run on him, please call back with this information

## 2017-02-16 NOTE — Telephone Encounter (Signed)
Called pt back. Symptoms have worsened. He has lost his ability to taste. Still having cramping/numbness/pain. Wanted to know what tests were done previously. I advised MRI brain/cervical spine done.  He states he is having tingling in scalp and other extremities. Face gets numb intermittently.  He has first appt with Dr Myles Rosenthal at the pain center on 04/08/17. This was first their first available. I recommended to patient that he call and request to be placed on cancellation list.   Pt wondering what can be done. He is concerned that symptoms are worsening.  Wanting Dr Jannifer Franklin' recommendation.

## 2017-02-22 ENCOUNTER — Ambulatory Visit (INDEPENDENT_AMBULATORY_CARE_PROVIDER_SITE_OTHER): Payer: PPO | Admitting: Family Medicine

## 2017-02-22 ENCOUNTER — Encounter: Payer: Self-pay | Admitting: Family Medicine

## 2017-02-22 ENCOUNTER — Ambulatory Visit: Payer: PPO | Admitting: Family Medicine

## 2017-02-22 VITALS — BP 108/62 | HR 70 | Temp 98.2°F | Resp 18 | Wt 216.0 lb

## 2017-02-22 DIAGNOSIS — G542 Cervical root disorders, not elsewhere classified: Secondary | ICD-10-CM | POA: Diagnosis not present

## 2017-02-22 DIAGNOSIS — J329 Chronic sinusitis, unspecified: Secondary | ICD-10-CM | POA: Diagnosis not present

## 2017-02-22 MED ORDER — GABAPENTIN 300 MG PO CAPS: 300.0000 mg | ORAL_CAPSULE | Freq: Two times a day (BID) | ORAL | 5 refills | Status: DC

## 2017-02-22 MED ORDER — AMOXICILLIN-POT CLAVULANATE 875-125 MG PO TABS: 1.0000 | ORAL_TABLET | Freq: Two times a day (BID) | ORAL | 0 refills | Status: DC

## 2017-02-22 NOTE — Progress Notes (Signed)
Patient: Randy Dean Male    DOB: Jan 16, 1966   51 y.o.   MRN: [id number] Visit Date: 02/22/2017  Today's Provider: [name], MD   Chief Complaint  Patient presents with  . Sinus Problem   Subjective:    HPI Pt is here for possible sinus infection. He reports that it started 3 days ago. He has post nasal drainage, sore throat, cough, nasal congestion, swollen lymph glands in neck. He reports that sometimes his cough is productive with clear and green mucus. He also gets sinus pain and pressure sometimes. Pt reports that he feels achy that is usually how sinus infections start.       Allergies  Allergen Reactions  . Serzone [Nefazodone]     Tongue and facial swelling  . Codeine Nausea Only    Dizziness, sweating  . Effexor [Venlafaxine]     "Feels out of it"  . Paxil [Paroxetine Hcl]     "does not feel right in the head"  . Esomeprazole Sodium Rash     Current Outpatient Prescriptions:  .  acetaminophen (TYLENOL) 500 MG tablet, Take 500 mg by mouth every 6 (six) hours as needed., Disp: , Rfl:  .  DEXILANT 60 MG capsule, take 1 capsule by mouth once daily, Disp: 30 capsule, Rfl: 12 .  fluticasone (FLONASE) 50 MCG/ACT nasal spray, Place 2 sprays into both nostrils 2 (two) times daily., Disp: 32 g, Rfl: 12 .  gabapentin (NEURONTIN) 100 MG capsule, Take 1 capsule (100 mg total) by mouth 3 (three) times daily., Disp: 90 capsule, Rfl: 5 .  loratadine (CLARITIN) 10 MG tablet, Take 10 mg by mouth daily., Disp: , Rfl:  .  meloxicam (MOBIC) 15 MG tablet, take 1 tablet by mouth once daily (Patient taking differently: take 1 tablet by mouth once daily PRN), Disp: 30 tablet, Rfl: 12 .  nortriptyline (PAMELOR) 10 MG capsule, Take one capsule at night for one week, then take 2 capsules at night for one week, then take 3 capsules at night, Disp: 90 capsule, Rfl: 3 .  nystatin (MYCOSTATIN) 100000 UNIT/ML suspension, Take 5 mLs (500,000 Units total) by mouth 4 (four) times  daily. Swish/gargle and spit, Disp: 120 mL, Rfl: 0 .  sucralfate (CARAFATE) 1 g tablet, Take 1 tablet (1 g total) by mouth 4 (four) times daily -  with meals and at bedtime., Disp: 120 tablet, Rfl: 1 .  traMADol (ULTRAM) 50 MG tablet, Take 1 tablet (50 mg total) by mouth every 6 (six) hours as needed., Disp: 30 tablet, Rfl: 1 .  ALPRAZolam (XANAX) 0.5 MG tablet, Take 2 tablets approximately 45 minutes prior to the MRI study, take a third tablet if needed. (Patient not taking: Reported on 02/22/2017), Disp: 3 tablet, Rfl: 0 .  Omega-3 Fatty Acids (FISH OIL) 1000 MG CAPS, Take by mouth., Disp: , Rfl:  .  sertraline (ZOLOFT) 50 MG tablet, Take 1 tablet (50 mg total) by mouth daily. (Patient not taking: Reported on 02/22/2017), Disp: 90 tablet, Rfl: 3  Review of Systems  Constitutional: Positive for fatigue.  HENT: Positive for congestion, postnasal drip, rhinorrhea, sinus pain, sinus pressure and sore throat.   Eyes: Positive for discharge.  Respiratory: Positive for cough.   Cardiovascular: Negative.   Gastrointestinal: Negative.   Endocrine: Negative.   Genitourinary: Negative.   Musculoskeletal: Negative.   Skin: Negative.   Allergic/Immunologic: Negative.   Neurological: Positive for headaches.  Hematological: Negative.   Psychiatric/Behavioral: Negative.  Social History  Substance Use Topics  . Smoking status: Former Smoker    Packs/day: 0.50    Years: 3.00    Quit date: 06/28/1984  . Smokeless tobacco: Current User    Last attempt to quit: 06/29/1986     Comment: 1 can dip/day  . Alcohol use No   Objective:   BP 108/62 (BP Location: Left Arm, Patient Position: Sitting, Cuff Size: Large)   Pulse 70   Temp 98.2 F (36.8 C) (Oral)   Resp 18   Wt 216 lb (98 kg)   SpO2 97%   BMI 29.29 kg/m  Vitals:   02/22/17 1436  BP: 108/62  Pulse: 70  Resp: 18  Temp: 98.2 F (36.8 C)  TempSrc: Oral  SpO2: 97%  Weight: 216 lb (98 kg)     Physical Exam  Constitutional: He is  oriented to person, place, and time. He appears well-developed and well-nourished.  HENT:  Head: Normocephalic and atraumatic.  Right Ear: External ear normal.  Left Ear: External ear normal.  Nose: Nose normal.  Mouth/Throat: Oropharynx is clear and moist.  Eyes: Pupils are equal, round, and reactive to light. Conjunctivae and EOM are normal.  Neck: Normal range of motion. Neck supple.  Cardiovascular: Normal rate, regular rhythm, normal heart sounds and intact distal pulses.   Pulmonary/Chest: Effort normal and breath sounds normal.  Musculoskeletal: Normal range of motion.  Neurological: He is alert and oriented to person, place, and time. He has normal reflexes.  Skin: Skin is warm and dry.  Psychiatric: He has a normal mood and affect. His behavior is normal. Judgment and thought content normal.        Assessment & Plan:     1. Chronic sinusitis, unspecified location  - amoxicillin-clavulanate (AUGMENTIN) 875-125 MG tablet; Take 1 tablet by mouth 2 (two) times daily.  Dispense: 20 tablet; Refill: 0  2. Cervical neuropathy  - gabapentin (NEURONTIN) 300 MG capsule; Take 1 capsule (300 mg total) by mouth 2 (two) times daily.  Dispense: 60 capsule; Refill: 5     HPI, Exam, and A&P Transcribed under the direction and in the presence of Terryon Pineiro L. Cranford Mon, MD  Electronically Signed: Katina Dung, CMA I have done the exam and reviewed the above chart and it is accurate to the best of my knowledge. Development worker, community has been used in this note in any air is in the dictation or transcription are unintentional.  [name], MD  [place]

## 2017-03-03 ENCOUNTER — Ambulatory Visit: Payer: PPO | Admitting: Family Medicine

## 2017-03-29 DIAGNOSIS — H02413 Mechanical ptosis of bilateral eyelids: Secondary | ICD-10-CM | POA: Diagnosis not present

## 2017-04-02 NOTE — Telephone Encounter (Signed)
ERROR

## 2017-04-05 ENCOUNTER — Ambulatory Visit: Payer: PPO | Admitting: Family Medicine

## 2017-04-12 ENCOUNTER — Emergency Department: Payer: PPO

## 2017-04-12 ENCOUNTER — Emergency Department
Admission: EM | Admit: 2017-04-12 | Discharge: 2017-04-12 | Disposition: A | Discharge status: Home / Self Care | Payer: PPO | Attending: Emergency Medicine | Admitting: Emergency Medicine

## 2017-04-12 ENCOUNTER — Encounter: Payer: Self-pay | Admitting: Emergency Medicine

## 2017-04-12 DIAGNOSIS — M545 Low back pain: Secondary | ICD-10-CM | POA: Diagnosis not present

## 2017-04-12 DIAGNOSIS — R0600 Dyspnea, unspecified: Secondary | ICD-10-CM | POA: Diagnosis not present

## 2017-04-12 DIAGNOSIS — Z5321 Procedure and treatment not carried out due to patient leaving prior to being seen by health care provider: Secondary | ICD-10-CM | POA: Diagnosis not present

## 2017-04-12 HISTORY — DX: Post-traumatic stress disorder, unspecified: F43.10

## 2017-04-12 HISTORY — DX: Anxiety disorder, unspecified: F41.9

## 2017-04-12 MED ORDER — ALBUTEROL SULFATE (2.5 MG/3ML) 0.083% IN NEBU: 5.0000 mg | INHALATION_SOLUTION | Freq: Once | RESPIRATORY_TRACT | Status: DC

## 2017-04-12 NOTE — ED Triage Notes (Signed)
Arrives C/O difficulty breathing and low back pain.  States back pains have been getting worse x 1 month. C/O worsening breathing x 2 weeks.  Patient also has had a productive cough x 2 weeks for clear phlegm.

## 2017-04-13 ENCOUNTER — Telehealth: Payer: Self-pay | Admitting: Emergency Medicine

## 2017-04-13 NOTE — Telephone Encounter (Signed)
Called patient due to lwot to inquire about condition and follow up plans. Pt says he has contacted his doctor/nurse.  They gave him some things to dayand says his breathing is better.  I explained he can return here as needed.

## 2017-04-20 ENCOUNTER — Ambulatory Visit (INDEPENDENT_AMBULATORY_CARE_PROVIDER_SITE_OTHER): Payer: PPO | Admitting: Family Medicine

## 2017-04-20 ENCOUNTER — Ambulatory Visit
Admission: RE | Admit: 2017-04-20 | Discharge: 2017-04-20 | Disposition: A | Discharge status: Home / Self Care | Payer: PPO | Source: Ambulatory Visit | Attending: Family Medicine | Admitting: Family Medicine

## 2017-04-20 VITALS — BP 108/62 | HR 82 | Temp 98.4°F | Resp 20 | Wt 217.6 lb

## 2017-04-20 DIAGNOSIS — M545 Low back pain: Secondary | ICD-10-CM | POA: Diagnosis not present

## 2017-04-20 DIAGNOSIS — S069X0S Unspecified intracranial injury without loss of consciousness, sequela: Secondary | ICD-10-CM | POA: Diagnosis not present

## 2017-04-20 DIAGNOSIS — F419 Anxiety disorder, unspecified: Secondary | ICD-10-CM

## 2017-04-20 DIAGNOSIS — F329 Major depressive disorder, single episode, unspecified: Secondary | ICD-10-CM

## 2017-04-20 DIAGNOSIS — M6281 Muscle weakness (generalized): Secondary | ICD-10-CM

## 2017-04-20 DIAGNOSIS — Z23 Encounter for immunization: Secondary | ICD-10-CM

## 2017-04-20 DIAGNOSIS — M5441 Lumbago with sciatica, right side: Secondary | ICD-10-CM | POA: Diagnosis not present

## 2017-04-20 DIAGNOSIS — G542 Cervical root disorders, not elsewhere classified: Secondary | ICD-10-CM

## 2017-04-20 DIAGNOSIS — F32A Depression, unspecified: Secondary | ICD-10-CM

## 2017-04-20 DIAGNOSIS — R0602 Shortness of breath: Secondary | ICD-10-CM | POA: Diagnosis not present

## 2017-04-20 DIAGNOSIS — K219 Gastro-esophageal reflux disease without esophagitis: Secondary | ICD-10-CM | POA: Diagnosis not present

## 2017-04-20 MED ORDER — GABAPENTIN 300 MG PO CAPS: 300.0000 mg | ORAL_CAPSULE | Freq: Two times a day (BID) | ORAL | 3 refills | Status: DC

## 2017-04-20 MED ORDER — MELOXICAM 15 MG PO TABS: 15.0000 mg | ORAL_TABLET | Freq: Every day | ORAL | 3 refills | Status: DC

## 2017-04-20 MED ORDER — FLUTICASONE PROPIONATE 50 MCG/ACT NA SUSP: 2.0000 | Freq: Two times a day (BID) | NASAL | 3 refills | Status: DC

## 2017-04-20 MED ORDER — TIZANIDINE HCL 4 MG PO TABS: 4.0000 mg | ORAL_TABLET | Freq: Three times a day (TID) | ORAL | 1 refills | Status: DC

## 2017-04-20 MED ORDER — SERTRALINE HCL 50 MG PO TABS: 50.0000 mg | ORAL_TABLET | Freq: Every day | ORAL | 3 refills | Status: DC

## 2017-04-20 MED ORDER — SUCRALFATE 1 G PO TABS: 1.0000 g | ORAL_TABLET | Freq: Three times a day (TID) | ORAL | 3 refills | Status: DC

## 2017-04-20 MED ORDER — DEXLANSOPRAZOLE 60 MG PO CPDR: 1.0000 | DELAYED_RELEASE_CAPSULE | Freq: Every day | ORAL | 3 refills | Status: DC

## 2017-04-20 NOTE — Progress Notes (Signed)
Randy Dean  MRN: [id number] DOB: Sep 23, 1965  Subjective:  HPI   Patient is here to discuss lower back pain. Patient states he has had this issues for a long time but has gotten worse the past year and become unbearable more in the past month. Last imaging done on L-Spine was on 02/04/09 Xray. Pain is interfering with daily life-examples- keeping his balance well, bowel movements, urinating, sleeping, etc. He tried OTC Selenpaz patches which helped some but not significant amount. He does take Meloxicam at bedtime daily. He had Tramadol on his medication list but patient states he has not been taking that in a while. He does not want to take a lot of medications and is afraid with seen people getting addicted to medications to become addicted to something like this.  Also patient states his breathing has been getting worse. He ended up going to ER on 04/12/17 and they did EKG, Chest Xray and then patient had to wait to actually be seen by a provider and after waiting for 2 hours he could not stay there any longer with pain he was having with his back and ended up leaving before seen a provider. Receptionist at ER told him that there were 6 people in front of him before they could take him back to the room. Patient states his breathing becoming more labored lately, "feels like he can not get enough air/oxygen." No pain or tightness with walking besides the usual.  Patient states he is getting to be more anxious. He is having feeling of "can not stand feeling his clothes, or bed sheets on his skin." He has not been taking Sertraline. He has been taking xanax.  Depression screen [facility] 2/9 04/20/2017 01/21/2017 09/07/2016  Decreased Interest 1 1 1   Down, Depressed, Hopeless 1 1 0  PHQ - 2 Score 2 2 1   Altered sleeping 3 3 3   Tired, decreased energy 3 3 3   Change in appetite 2 2 2   Feeling bad or failure about yourself  2 0 1  Trouble concentrating 3 3 2   Moving slowly or fidgety/restless 2 0 2    Suicidal thoughts 0 0 0  PHQ-9 Score 17 13 14   Difficult doing work/chores Very difficult - -   Patient Active Problem List   Diagnosis Date Noted  . Prediabetes 10/21/2016  . Abnormality of gait 10/30/2015  . Headache 10/30/2015  . Anxiety and depression 11/23/2014  . Brain injuries ([place]) 11/23/2014  . Clinical depression 11/23/2014  . HLD (hyperlipidemia) 11/23/2014  . Insomnia due to medical condition 11/23/2014  . Injury of face and neck 11/23/2014  . Cervical pain 11/23/2014  . Loss of feeling or sensation 11/23/2014  . Tingling 11/23/2014  . Adiposity 11/23/2014  . Allergic rhinitis, seasonal 11/23/2014  . Neurosis, posttraumatic 11/23/2014  . Spinal stenosis in cervical region 11/23/2014  . Injury brain, traumatic ([place]) 11/23/2014  . Has a tremor 11/23/2014  . Disease of vein 11/23/2014  . Head revolving around 11/23/2014  . Shortness of breath 03/14/2013  . Anxiety 10/11/2008  . Intervertebral cervical disc disorder with myelopathy, cervical region 10/11/2008  . Musculoskeletal disorder and symptoms referable to neck 10/11/2008  . Acid reflux 10/11/2008    Past Medical History:  Diagnosis Date  . Abnormality of gait 10/30/2015  . Allergic rhinitis   . Anxiety   . Chronic headaches    partially sinus/partially brain injury (per pt)  . GERD (gastroesophageal reflux disease)   . Head trauma 2010  has caused anxiety, memory issues and neck problems  . Headache 10/30/2015   Right temporal   . Neck stiffness    metal plate.  mild limitations of side to side and up and dowm movement  . Prediabetes   . PTSD (post-traumatic stress disorder)   . Stroke [facility])    TIA x3- some residual right side weakness  . Vertigo   . Weakness of right side of body    from TIAs    Social History   Social History  . Marital status: Married    Spouse name: Lattie Haw  . Number of children: 0  . Years of education: 14   Occupational History  . Not on file.   Social History Main  Topics  . Smoking status: Former Smoker    Packs/day: 0.50    Years: 3.00    Quit date: 06/28/1984  . Smokeless tobacco: Current User    Last attempt to quit: 06/29/1986     Comment: 1 can dip/day  . Alcohol use No  . Drug use: No  . Sexual activity: Not on file   Other Topics Concern  . Not on file   Social History Narrative   Lives at home with his wife Lattie Haw   Right-handed   Drinks tea or soda about 6 times per day    Outpatient Encounter Prescriptions as of 04/20/2017  Medication Sig  . acetaminophen (TYLENOL) 500 MG tablet Take 500 mg by mouth every 6 (six) hours as needed.  . ALPRAZolam (XANAX) 0.5 MG tablet Take 2 tablets approximately 45 minutes prior to the MRI study, take a third tablet if needed. (Patient not taking: Reported on 02/22/2017)  . DEXILANT 60 MG capsule take 1 capsule by mouth once daily  . fluticasone (FLONASE) 50 MCG/ACT nasal spray Place 2 sprays into both nostrils 2 (two) times daily.  Marland Kitchen gabapentin (NEURONTIN) 300 MG capsule Take 1 capsule (300 mg total) by mouth 2 (two) times daily.  Marland Kitchen loratadine (CLARITIN) 10 MG tablet Take 10 mg by mouth daily.  . meloxicam (MOBIC) 15 MG tablet take 1 tablet by mouth once daily (Patient taking differently: take 1 tablet by mouth once daily PRN)  . sertraline (ZOLOFT) 50 MG tablet Take 1 tablet (50 mg total) by mouth daily. (Patient not taking: Reported on 02/22/2017)  . sucralfate (CARAFATE) 1 g tablet Take 1 tablet (1 g total) by mouth 4 (four) times daily -  with meals and at bedtime.  . traMADol (ULTRAM) 50 MG tablet Take 1 tablet (50 mg total) by mouth every 6 (six) hours as needed. (Patient not taking: Reported on 04/20/2017)  . [DISCONTINUED] amoxicillin-clavulanate (AUGMENTIN) 875-125 MG tablet Take 1 tablet by mouth 2 (two) times daily.  . [DISCONTINUED] nortriptyline (PAMELOR) 10 MG capsule Take one capsule at night for one week, then take 2 capsules at night for one week, then take 3 capsules at night  .  [DISCONTINUED] nystatin (MYCOSTATIN) 100000 UNIT/ML suspension Take 5 mLs (500,000 Units total) by mouth 4 (four) times daily. Swish/gargle and spit  . [DISCONTINUED] Omega-3 Fatty Acids (FISH OIL) 1000 MG CAPS Take by mouth.   No facility-administered encounter medications on file as of 04/20/2017.     Allergies  Allergen Reactions  . Serzone [Nefazodone]     Tongue and facial swelling  . Codeine Nausea Only    Dizziness, sweating  . Effexor [Venlafaxine]     "Feels out of it"  . Paxil [Paroxetine Hcl]     "does not  feel right in the head"  . Esomeprazole Sodium Rash    Review of Systems  Constitutional: Positive for malaise/fatigue.  HENT: Positive for congestion.   Eyes: Negative.   Respiratory: Positive for cough and shortness of breath (becoming more labored breathing).        Snores  Cardiovascular: Negative.   Gastrointestinal: Positive for heartburn (at times). Negative for nausea and vomiting.  Genitourinary: Negative.   Musculoskeletal: Positive for back pain, joint pain, myalgias and neck pain. Negative for falls.  Skin: Negative.   Neurological: Positive for dizziness, tingling (and numbness), tremors, weakness and headaches.       Unsteady gait.  Endo/Heme/Allergies: Negative.   Psychiatric/Behavioral: Positive for depression. The patient is nervous/anxious and has insomnia.     Objective:  BP 108/62   Pulse 82   Temp 98.4 F (36.9 C)   Resp 20   Wt 217 lb 9.6 oz (98.7 kg)   BMI 29.51 kg/m   Physical Exam  Constitutional: He is oriented to person, place, and time and well-developed, well-nourished, and in no distress.  HENT:  Head: Normocephalic.  Eyes: Pupils are equal, round, and reactive to light. Conjunctivae are normal. No scleral icterus.  Neck: No thyromegaly present.  Cardiovascular: Normal rate, regular rhythm, normal heart sounds and intact distal pulses.  Exam reveals no gallop.   No murmur heard. Pulmonary/Chest: Effort normal and breath  sounds normal. No respiratory distress. He has no wheezes.  Abdominal: Soft.  Musculoskeletal: He exhibits tenderness.  Tender at the belt line, bilateral.  Neurological: He is alert and oriented to person, place, and time. Gait abnormal.  DTRs are normal  Skin: Skin is warm and dry.  Psychiatric: Memory, affect and judgment normal. His mood appears anxious. He exhibits a depressed mood. He expresses no homicidal and no suicidal ideation.    Assessment and Plan :  1. Acute bilateral low back pain with right-sided sciatica Get Xray. Try Tizanidine. Also patient advised to try OTC Capsacin topical cream twice daily. Re check in 1 month - DG Lumbar Spine Complete; Future - tiZANidine (ZANAFLEX) 4 MG tablet; Take 1 tablet (4 mg total) by mouth 3 (three) times daily.  Dispense: 90 tablet; Refill: 1  2. Cervical neuropathy 3. Gastroesophageal reflux disease, esophagitis presence not specified 4. Muscle weakness  5. Anxiety and depression Worsening. Patient advised to re start Sertraline-refill sent in. I think breathing issue is worse due to how he feels emotionally also. Re check in 1 month. - sertraline (ZOLOFT) 50 MG tablet; Take 1 tablet (50 mg total) by mouth daily.  Dispense: 90 tablet; Refill: 3  6. Traumatic brain injury, without loss of consciousness, sequela ([place])  7. Shortness of breath Spirometry is normal today. I do not think this is cardiac or pulmonology issue. I think this is related to his anxiety. Will re check in 1 month. Not exertional--feels like he cannot get a deep enough breath. - Spirometry with graph Epworth score today is 7. Patient was advised to ask his wife if he stops breathing at night. 8. Need for immunization against influenza - Flu Vaccine QUAD 36+ mos IM  HPI, Exam and A&P transcribed by Tiffany Kocher, RMA under direction and in the presence of Miguel Aschoff, MD. .I have done the exam and reviewed the chart and it is accurate to the best of my  knowledge. Development worker, community has been used and  any errors in dictation or transcription are unintentional. Miguel Aschoff M.D. [facility]

## 2017-04-20 NOTE — Patient Instructions (Addendum)
Try Capsacin topical twice daily. Re start Sertraline and refill has been sent in to the pharmacy.

## 2017-04-22 ENCOUNTER — Other Ambulatory Visit: Payer: Self-pay

## 2017-04-22 MED ORDER — FLUTICASONE PROPIONATE 50 MCG/ACT NA SUSP: 2.0000 | Freq: Two times a day (BID) | NASAL | 3 refills | Status: DC

## 2017-04-30 IMAGING — CT CT HEAD W/O CM
3 of 4 series · 16 of 47 positions shown, 19 images · non-contrast
Comparison: MRI March 11, 2011.  CT scan February 04, 2009.

CLINICAL DATA: Speech difficulty, lethargy.

EXAM:
CT HEAD WITHOUT CONTRAST
TECHNIQUE: Contiguous axial images were obtained from the base of the skull
through the vertex without intravenous contrast.

[Series 4: head wo · axial · 0.41mm/px · z∈[-218,-98]mm · 10 of 30 slices shown, 13 images]
[im 3/30  brain]
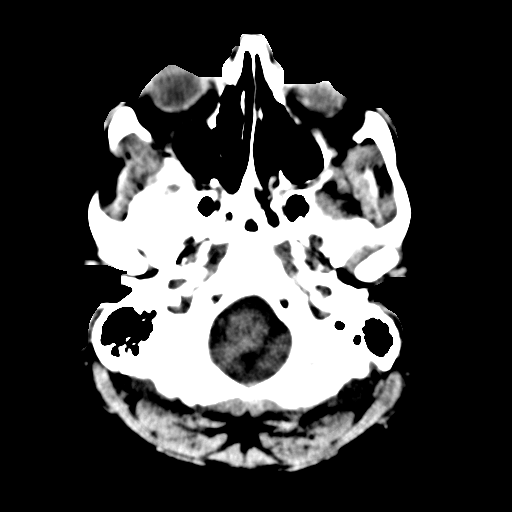
[im 3/30  bone]
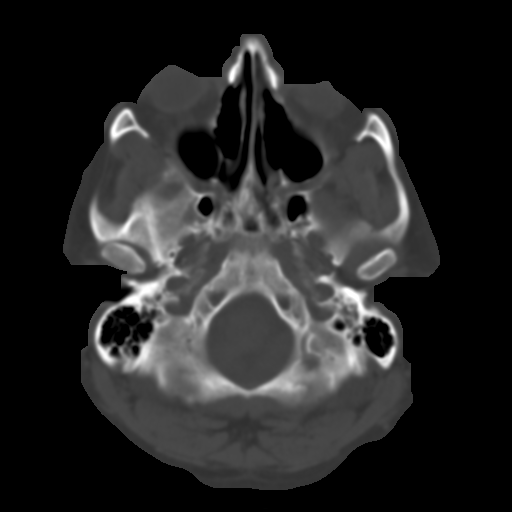
[im 5/30  brain]
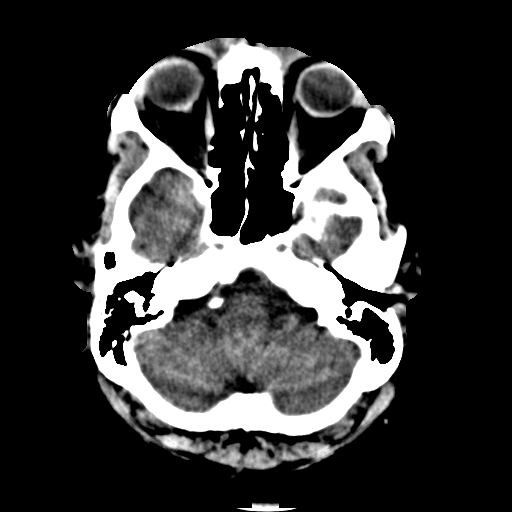
[im 8/30  brain]
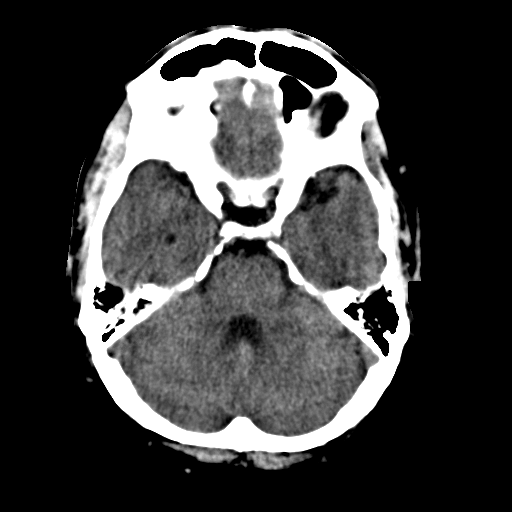
[im 11/30  brain]
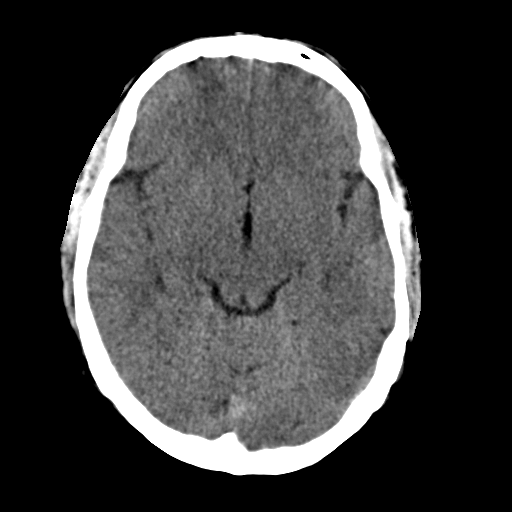
[im 13/30  brain]
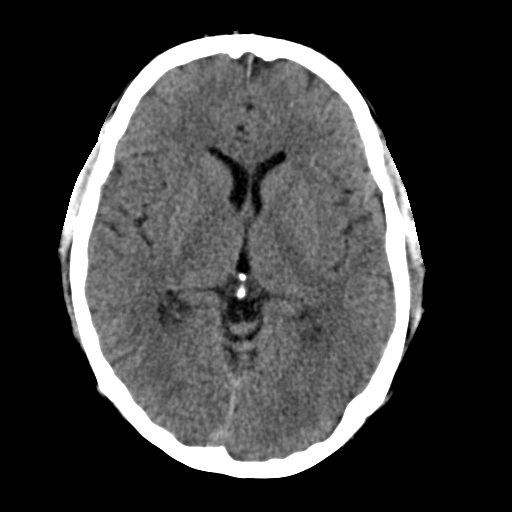
[im 13/30  bone]
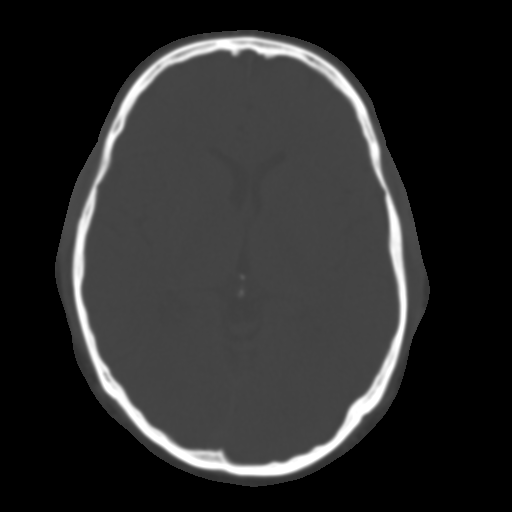
[im 17/30  brain]
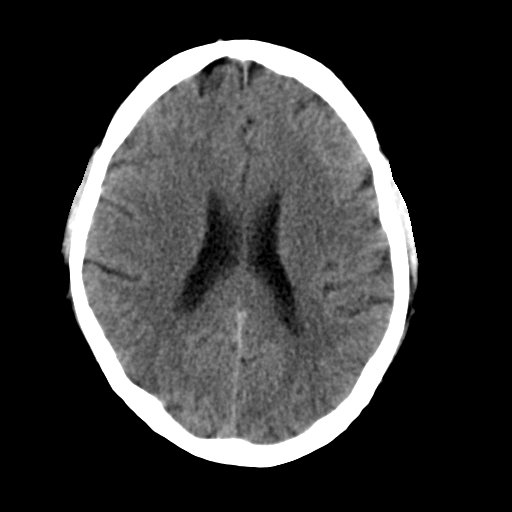
[im 19/30  brain]
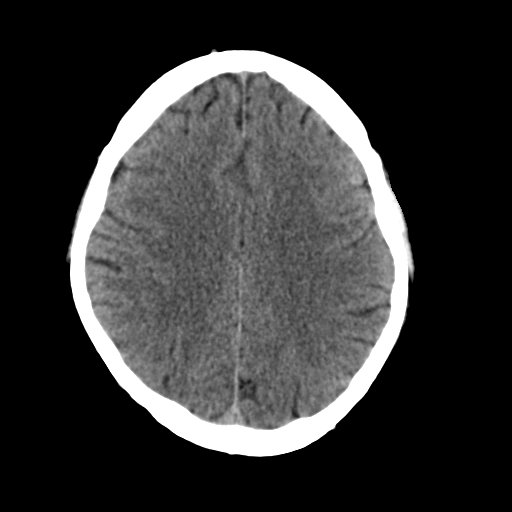
[im 22/30  brain]
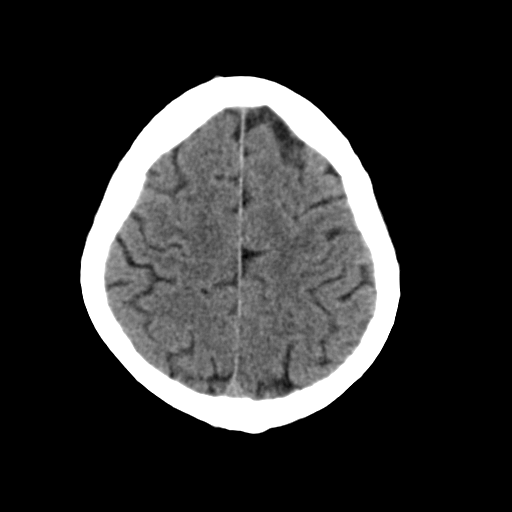
[im 25/30  brain]
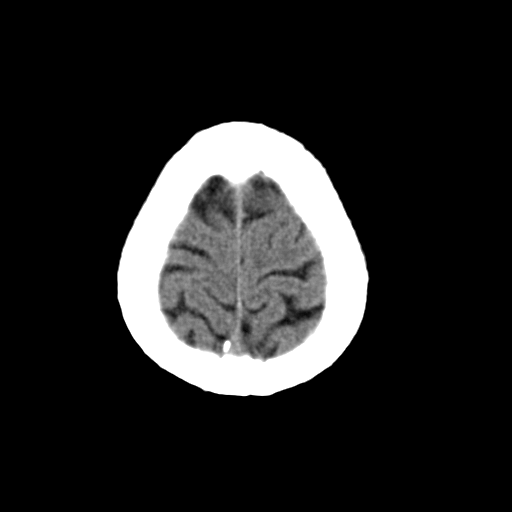
[im 25/30  bone]
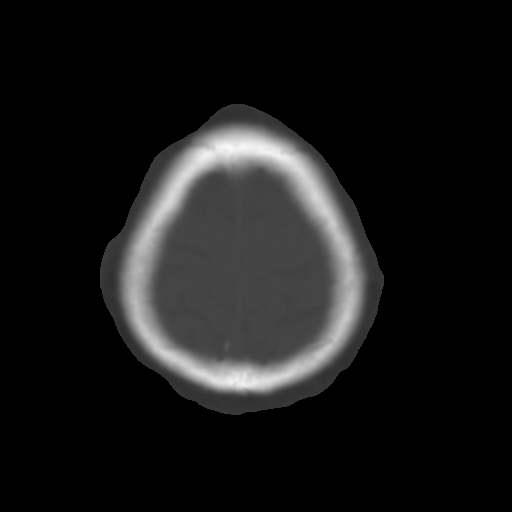
[im 27/30  brain]
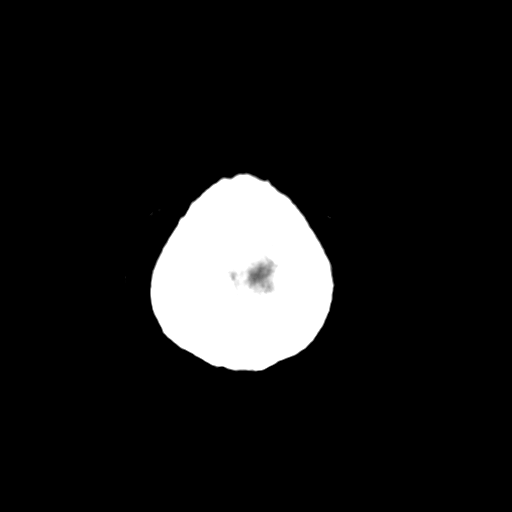

[Series 6: coronal soft · coronal · 0.33mm/px · 3 of 65 slices shown]
[im 22/65  brain]
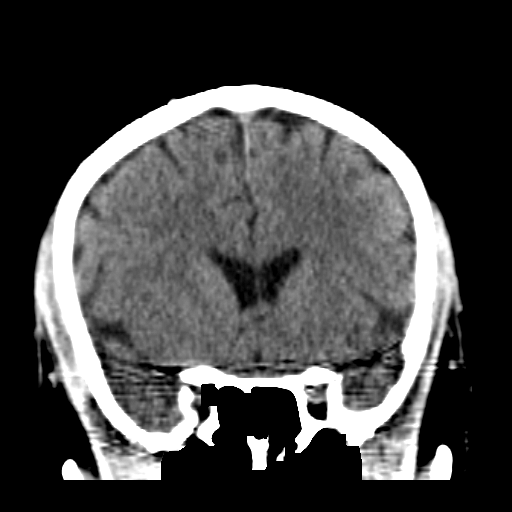
[im 29/65  brain]
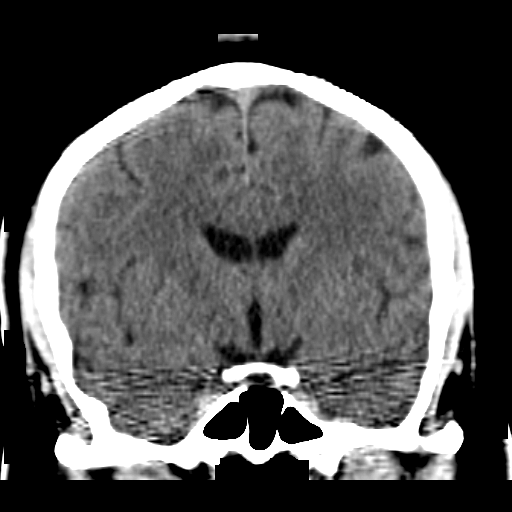
[im 36/65  brain]
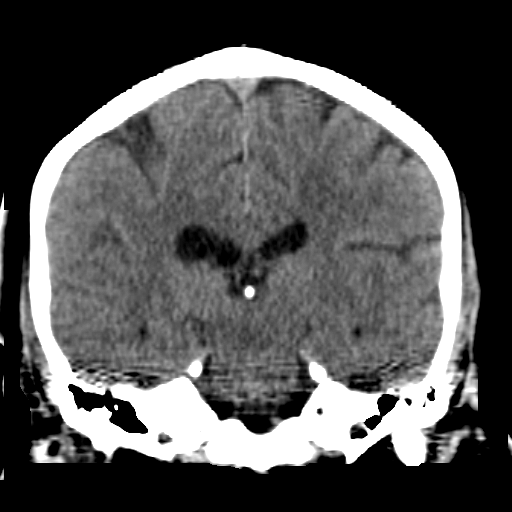

[Series 7: sagittal soft · sagittal · 0.31mm/px · 3 of 54 slices shown]
[im 18/54  brain]
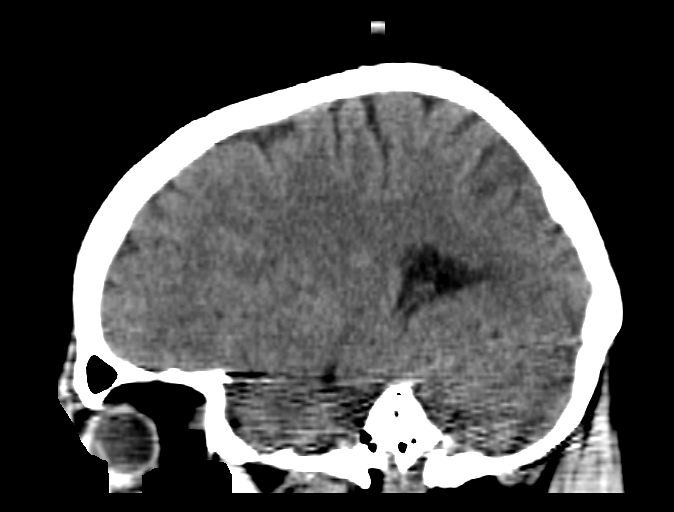
[im 27/54  brain]
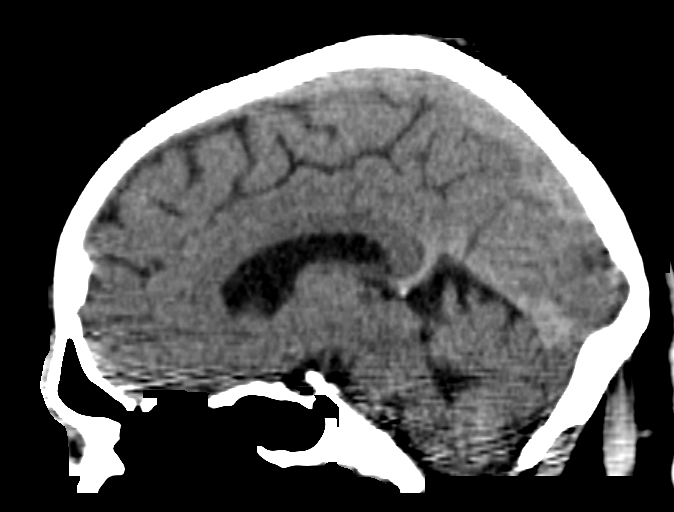
[im 36/54  brain]
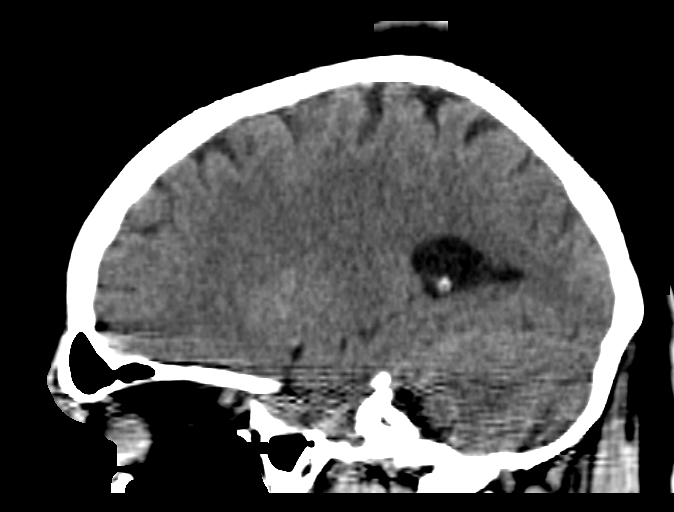

[16 of 47 positions shown; findings below may reference images not displayed]

FINDINGS: Bony calvarium appears intact. No mass effect or midline shift is
noted. Ventricular size is within normal limits. There is no
evidence of mass lesion, hemorrhage or acute infarction.
IMPRESSION: Normal head CT.

## 2017-05-26 ENCOUNTER — Ambulatory Visit: Payer: PPO | Admitting: Family Medicine

## 2017-05-27 ENCOUNTER — Ambulatory Visit: Payer: PPO | Admitting: Family Medicine

## 2017-05-27 ENCOUNTER — Encounter: Payer: Self-pay | Admitting: Family Medicine

## 2017-05-27 VITALS — BP 112/76 | HR 86 | Temp 98.8°F | Resp 20 | Wt 219.0 lb

## 2017-05-27 DIAGNOSIS — J329 Chronic sinusitis, unspecified: Secondary | ICD-10-CM

## 2017-05-27 DIAGNOSIS — J0100 Acute maxillary sinusitis, unspecified: Secondary | ICD-10-CM | POA: Diagnosis not present

## 2017-05-27 DIAGNOSIS — B351 Tinea unguium: Secondary | ICD-10-CM

## 2017-05-27 MED ORDER — NYSTATIN 100000 UNIT/ML MT SUSP: 5.0000 mL | Freq: Four times a day (QID) | OROMUCOSAL | 0 refills | Status: DC

## 2017-05-27 MED ORDER — TERBINAFINE HCL 250 MG PO TABS: ORAL_TABLET | ORAL | 6 refills | Status: AC

## 2017-05-27 MED ORDER — AMOXICILLIN-POT CLAVULANATE 875-125 MG PO TABS: 1.0000 | ORAL_TABLET | Freq: Two times a day (BID) | ORAL | 0 refills | Status: DC

## 2017-05-27 NOTE — Patient Instructions (Signed)

## 2017-05-27 NOTE — Progress Notes (Signed)
Patient: Randy Dean Male    DOB: 06-06-1966   51 y.o.   MRN: [id number] Visit Date: 05/27/2017  Today's Provider: Lelon Huh, MD   Chief Complaint  Patient presents with  . URI   Subjective:    URI   This is a new problem. Episode onset: 5 days ago. The problem has been unchanged. There has been no fever. Associated symptoms include congestion, coughing (sometimes productive with clear to green sputum), ear pain, headaches, joint pain, a plugged ear sensation, rhinorrhea, sneezing and a sore throat. Pertinent negatives include no abdominal pain, chest pain, diarrhea, dysuria, nausea, vomiting or wheezing. Treatments tried: Robitussin DM and Tylenol. The treatment provided mild relief.   He has history of recurrent sinusitis requiring antibiotic treatment.  He also complains of discoloration and deformity of right great toenail. Had area excised when he had pedicure but returned within a few months.     Allergies  Allergen Reactions  . Serzone [Nefazodone]     Tongue and facial swelling  . Codeine Nausea Only    Dizziness, sweating  . Effexor [Venlafaxine]     "Feels out of it"  . Paxil [Paroxetine Hcl]     "does not feel right in the head"  . Esomeprazole Sodium Rash     Current Outpatient Medications:  .  acetaminophen (TYLENOL) 500 MG tablet, Take 500 mg by mouth every 6 (six) hours as needed., Disp: , Rfl:  .  ALPRAZolam (XANAX) 0.5 MG tablet, Take 2 tablets approximately 45 minutes prior to the MRI study, take a third tablet if needed., Disp: 3 tablet, Rfl: 0 .  dexlansoprazole (DEXILANT) 60 MG capsule, Take 1 capsule (60 mg total) by mouth daily., Disp: 90 capsule, Rfl: 3 .  fluticasone (FLONASE) 50 MCG/ACT nasal spray, Place 2 sprays into both nostrils 2 (two) times daily., Disp: 96 g, Rfl: 3 .  gabapentin (NEURONTIN) 300 MG capsule, Take 1 capsule (300 mg total) by mouth 2 (two) times daily., Disp: 180 capsule, Rfl: 3 .  loratadine (CLARITIN) 10 MG  tablet, Take 10 mg by mouth daily., Disp: , Rfl:  .  meloxicam (MOBIC) 15 MG tablet, Take 1 tablet (15 mg total) by mouth daily., Disp: 90 tablet, Rfl: 3 .  sertraline (ZOLOFT) 50 MG tablet, Take 1 tablet (50 mg total) by mouth daily., Disp: 90 tablet, Rfl: 3 .  sucralfate (CARAFATE) 1 g tablet, Take 1 tablet (1 g total) by mouth 4 (four) times daily -  with meals and at bedtime., Disp: 360 tablet, Rfl: 3 .  tiZANidine (ZANAFLEX) 4 MG tablet, Take 1 tablet (4 mg total) by mouth 3 (three) times daily., Disp: 90 tablet, Rfl: 1  Review of Systems  Constitutional: Positive for chills. Negative for appetite change, diaphoresis and fever.  HENT: Positive for congestion, ear pain, rhinorrhea, sneezing and sore throat.   Eyes: Negative for discharge and itching.  Respiratory: Positive for cough (sometimes productive with clear to green sputum). Negative for chest tightness, shortness of breath and wheezing.   Cardiovascular: Negative for chest pain and palpitations.  Gastrointestinal: Negative for abdominal pain, diarrhea, nausea and vomiting.  Genitourinary: Negative for dysuria.  Musculoskeletal: Positive for joint pain.  Skin:       Change in nail color on big toe of left foot  Neurological: Positive for headaches.    Social History   Tobacco Use  . Smoking status: Former Smoker    Packs/day: 0.50    Years: 3.00  Pack years: 1.50    Last attempt to quit: 06/28/1984    Years since quitting: 32.9  . Smokeless tobacco: Current User    Types: Snuff    Last attempt to quit: 06/29/1986  . Tobacco comment: 1 can dip/day  Substance Use Topics  . Alcohol use: No    Alcohol/week: 0.0 oz   Objective:   BP 112/76 (BP Location: Left Arm, Patient Position: Sitting, Cuff Size: Large)   Pulse 86   Temp 98.8 F (37.1 C) (Oral)   Resp 20   Wt 219 lb (99.3 kg)   SpO2 96% Comment: room air  BMI 29.70 kg/m  There were no vitals filed for this visit.   Physical Exam  General Appearance:     Alert, cooperative, no distress  HENT:   bilateral TM normal without fluid or infection,neck without nodes, throat normal without erythema or exudate, frontal and maxillary sinus tender and nasal mucosa pale and congested  Eyes:    PERRL, conjunctiva/corneas clear, EOM's intact       Lungs:     Clear to auscultation bilaterally, respirations unlabored  Heart:    Regular rate and rhythm  Ext::   Yellow, thickened dystrophic right great toenail.           Assessment & Plan:     1. Acute maxillary sinusitis, recurrence not specified  - amoxicillin-clavulanate (AUGMENTIN) 875-125 MG tablet; Take 1 tablet by mouth 2 (two) times daily.  Dispense: 20 tablet; Refill: 0  He states he is prone to developing thrush when he takes antibiotic.  - nystatin (MYCOSTATIN) 100000 UNIT/ML suspension; Take 5 mLs (500,000 Units total) by mouth 4 (four) times daily. Swish/gargle and spit  Dispense: 120 mL; Refill: 0  2. Chronic sinusitis, unspecified location   3. OM (onychomycosis)  - terbinafine (LAMISIL) 250 MG tablet; Take 2 tablets daily for one week out of each month  Dispense: 28 tablet; Refill: 6  Counseled that he could try continuous antifungal therapy if pulse therapy is not effective, but that would increase risk of liver toxicity and would require monitoring of liver functions. Advised to avoid alcohol consumption on weeks he takes terbinafine.       Lelon Huh, MD  [facility]

## 2017-06-07 ENCOUNTER — Ambulatory Visit: Payer: PPO | Admitting: Neurology

## 2017-07-19 ENCOUNTER — Telehealth: Payer: Self-pay | Admitting: Family Medicine

## 2017-07-19 NOTE — Telephone Encounter (Signed)
Yes--someone

## 2017-07-19 NOTE — Telephone Encounter (Signed)
Do you want to see him first?

## 2017-07-19 NOTE — Telephone Encounter (Signed)
Patient called in stating his ankles feel like there are hot wraps around them and having cramping in his legs.   Still having pain in back that has worsened somewhat.   He wants to know if you would prescribe Prednisone for him to see if this would help.   Uses [facility] [facility].

## 2017-07-20 ENCOUNTER — Ambulatory Visit (INDEPENDENT_AMBULATORY_CARE_PROVIDER_SITE_OTHER): Payer: PPO | Admitting: Family Medicine

## 2017-07-20 VITALS — BP 116/60 | HR 76 | Temp 97.7°F | Resp 14 | Wt 223.0 lb

## 2017-07-20 DIAGNOSIS — M5441 Lumbago with sciatica, right side: Secondary | ICD-10-CM

## 2017-07-20 DIAGNOSIS — K219 Gastro-esophageal reflux disease without esophagitis: Secondary | ICD-10-CM | POA: Diagnosis not present

## 2017-07-20 MED ORDER — PREDNISONE 10 MG (21) PO TBPK: ORAL_TABLET | ORAL | 0 refills | Status: DC

## 2017-07-20 MED ORDER — DEXLANSOPRAZOLE 60 MG PO CPDR: 1.0000 | DELAYED_RELEASE_CAPSULE | Freq: Every day | ORAL | 3 refills | Status: DC

## 2017-07-20 NOTE — Telephone Encounter (Signed)
Pt advised and appointment made-Anastasiya V Hopkins, RMA  

## 2017-07-20 NOTE — Progress Notes (Signed)
Patient: Randy Dean Male    DOB: 05-Aug-1965   52 y.o.   MRN: [id number] Visit Date: 07/20/2017  Today's Provider: [name], MD   Chief Complaint  Patient presents with  . Back Pain   Subjective:    HPI Pt reports that his back pain is getting worse and now he has "hotspots" on his right leg. He reports that it feels like someone is taking a hair dryer and blowing on his leg. He was seen on 04/20/17 for th back pain. He was sent for x-rays and started on muscle relaxers. This did not help him. xray's were ok. He is also having cramps in the back side of his thigh. He also reports that his right arm has been getting weaker and the pain in it is still there but he was told that there was nothing anyone could do for that. He would like a refill on his Meloxicam and Dexilant      Allergies  Allergen Reactions  . Serzone [Nefazodone]     Tongue and facial swelling  . Codeine Nausea Only    Dizziness, sweating  . Effexor [Venlafaxine]     "Feels out of it"  . Paxil [Paroxetine Hcl]     "does not feel right in the head"  . Esomeprazole Sodium Rash     Current Outpatient Medications:  .  acetaminophen (TYLENOL) 500 MG tablet, Take 500 mg by mouth every 6 (six) hours as needed., Disp: , Rfl:  .  ALPRAZolam (XANAX) 0.5 MG tablet, Take 2 tablets approximately 45 minutes prior to the MRI study, take a third tablet if needed., Disp: 3 tablet, Rfl: 0 .  dexlansoprazole (DEXILANT) 60 MG capsule, Take 1 capsule (60 mg total) by mouth daily., Disp: 90 capsule, Rfl: 3 .  meloxicam (MOBIC) 15 MG tablet, Take 1 tablet (15 mg total) by mouth daily., Disp: 90 tablet, Rfl: 3 .  sertraline (ZOLOFT) 50 MG tablet, Take 1 tablet (50 mg total) by mouth daily., Disp: 90 tablet, Rfl: 3 .  sucralfate (CARAFATE) 1 g tablet, Take 1 tablet (1 g total) by mouth 4 (four) times daily -  with meals and at bedtime., Disp: 360 tablet, Rfl: 3 .  tiZANidine (ZANAFLEX) 4 MG tablet, Take 1 tablet  (4 mg total) by mouth 3 (three) times daily., Disp: 90 tablet, Rfl: 1 .  amoxicillin-clavulanate (AUGMENTIN) 875-125 MG tablet, Take 1 tablet by mouth 2 (two) times daily. (Patient not taking: Reported on 07/20/2017), Disp: 20 tablet, Rfl: 0 .  fluticasone (FLONASE) 50 MCG/ACT nasal spray, Place 2 sprays into both nostrils 2 (two) times daily., Disp: 96 g, Rfl: 3 .  gabapentin (NEURONTIN) 300 MG capsule, Take 1 capsule (300 mg total) by mouth 2 (two) times daily. (Patient not taking: Reported on 07/20/2017), Disp: 180 capsule, Rfl: 3 .  loratadine (CLARITIN) 10 MG tablet, Take 10 mg by mouth daily., Disp: , Rfl:  .  nystatin (MYCOSTATIN) 100000 UNIT/ML suspension, Take 5 mLs (500,000 Units total) by mouth 4 (four) times daily. Swish/gargle and spit (Patient not taking: Reported on 07/20/2017), Disp: 120 mL, Rfl: 0 .  terbinafine (LAMISIL) 250 MG tablet, Take 2 tablets daily for one week out of each month, Disp: 28 tablet, Rfl: 6  Review of Systems  HENT: Negative.   Eyes: Negative.   Respiratory: Negative.   Cardiovascular: Negative.   Gastrointestinal: Negative.   Endocrine: Negative.   Genitourinary: Negative.   Musculoskeletal: Positive for back  pain, myalgias and neck pain.  Skin: Negative.   Allergic/Immunologic: Negative.   Neurological: Positive for weakness.  Hematological: Negative.   Psychiatric/Behavioral: Negative.     Social History   Tobacco Use  . Smoking status: Former Smoker    Packs/day: 0.50    Years: 3.00    Pack years: 1.50    Last attempt to quit: 06/28/1984    Years since quitting: 33.0  . Smokeless tobacco: Current User    Types: Snuff    Last attempt to quit: 06/29/1986  . Tobacco comment: 1 can dip/day  Substance Use Topics  . Alcohol use: No    Alcohol/week: 0.0 oz   Objective:   BP 116/60 (BP Location: Left Arm, Patient Position: Sitting, Cuff Size: Large)   Pulse 76   Temp 97.7 F (36.5 C) (Oral)   Resp 14   Wt 223 lb (101.2 kg)   SpO2 97%    BMI 30.24 kg/m  Vitals:   07/20/17 1432  BP: 116/60  Pulse: 76  Resp: 14  Temp: 97.7 F (36.5 C)  TempSrc: Oral  SpO2: 97%  Weight: 223 lb (101.2 kg)     Physical Exam  Constitutional: He is oriented to person, place, and time. He appears well-developed and well-nourished.  HENT:  Head: Normocephalic and atraumatic.  Right Ear: External ear normal.  Left Ear: External ear normal.  Nose: Nose normal.  Eyes: Conjunctivae are normal. No scleral icterus.  Neck: No thyromegaly present.  Cardiovascular: Normal rate, regular rhythm and normal heart sounds.  Pulmonary/Chest: Effort normal and breath sounds normal.  Abdominal: Soft.  Musculoskeletal: He exhibits no edema, tenderness or deformity.  Neurological: He is alert and oriented to person, place, and time. No cranial nerve deficit. He exhibits normal muscle tone. Coordination normal.  Skin: Skin is warm and dry.  Psychiatric: He has a normal mood and affect. His behavior is normal. Judgment and thought content normal.        Assessment & Plan:     1. Gastroesophageal reflux disease, esophagitis presence not specified  - dexlansoprazole (DEXILANT) 60 MG capsule; Take 1 capsule (60 mg total) by mouth daily.  Dispense: 90 capsule; Refill: 3  2. Acute bilateral low back pain with right-sided sciatica  - predniSONE (STERAPRED UNI-PAK 21 TAB) 10 MG (21) TBPK tablet; Take 6 tablets on the first day then decrease by one daily until finished.  Dispense: 21 tablet; Refill: 0 - MR Lumbar Spine Wo Contrast; Future       [name], MD  [facility]

## 2017-07-27 ENCOUNTER — Telehealth: Payer: Self-pay | Admitting: Family Medicine

## 2017-07-27 NOTE — Telephone Encounter (Signed)
Pt states he will need something to help him relax during MRI.He would like this sent to [facility]

## 2017-07-27 NOTE — Telephone Encounter (Signed)
Please advise-Randy Dean Sylvie Mifsud, RMA  

## 2017-08-02 ENCOUNTER — Other Ambulatory Visit: Payer: Self-pay

## 2017-08-02 MED ORDER — DIAZEPAM 5 MG PO TABS: 5.0000 mg | ORAL_TABLET | ORAL | 0 refills | Status: DC

## 2017-08-02 NOTE — Telephone Encounter (Signed)
Done  ED 

## 2017-08-02 NOTE — Telephone Encounter (Signed)
Diazepam 5mg --1 hour before procedure--may repeat times one. #3 tabs.

## 2017-08-03 ENCOUNTER — Ambulatory Visit: Payer: PPO

## 2017-08-06 ENCOUNTER — Ambulatory Visit
Admission: RE | Admit: 2017-08-06 | Discharge: 2017-08-06 | Disposition: A | Discharge status: Home / Self Care | Payer: PPO | Source: Ambulatory Visit | Attending: Family Medicine | Admitting: Family Medicine

## 2017-08-06 DIAGNOSIS — M5127 Other intervertebral disc displacement, lumbosacral region: Secondary | ICD-10-CM | POA: Diagnosis not present

## 2017-08-06 DIAGNOSIS — M545 Low back pain: Secondary | ICD-10-CM | POA: Diagnosis not present

## 2017-08-06 DIAGNOSIS — M5441 Lumbago with sciatica, right side: Secondary | ICD-10-CM

## 2017-08-12 ENCOUNTER — Telehealth: Payer: Self-pay | Admitting: Family Medicine

## 2017-08-12 ENCOUNTER — Encounter: Payer: Self-pay | Admitting: Family Medicine

## 2017-08-12 NOTE — Telephone Encounter (Signed)
Dr Darnell Level, can you sign off on this Thanks

## 2017-08-12 NOTE — Telephone Encounter (Signed)
Patient is requesting results from his MRI that he had on 08/06/2017.

## 2017-08-13 NOTE — Telephone Encounter (Signed)
Spoke with pt. clarified with pt per Dr. Marlan Palau result note.

## 2017-08-23 ENCOUNTER — Encounter: Payer: Self-pay | Admitting: Family Medicine

## 2017-08-23 ENCOUNTER — Ambulatory Visit (INDEPENDENT_AMBULATORY_CARE_PROVIDER_SITE_OTHER): Payer: PPO | Admitting: Family Medicine

## 2017-08-23 VITALS — BP 120/84 | HR 70 | Temp 98.0°F | Resp 16 | Ht 72.0 in | Wt 225.0 lb

## 2017-08-23 DIAGNOSIS — M4802 Spinal stenosis, cervical region: Secondary | ICD-10-CM

## 2017-08-23 DIAGNOSIS — H1033 Unspecified acute conjunctivitis, bilateral: Secondary | ICD-10-CM | POA: Diagnosis not present

## 2017-08-23 DIAGNOSIS — M545 Low back pain, unspecified: Secondary | ICD-10-CM

## 2017-08-23 DIAGNOSIS — G542 Cervical root disorders, not elsewhere classified: Secondary | ICD-10-CM

## 2017-08-23 MED ORDER — CIPROFLOXACIN HCL 0.3 % OP SOLN: 2.0000 [drp] | OPHTHALMIC | Status: DC

## 2017-08-23 NOTE — Patient Instructions (Addendum)
5 minus 5 over strength on the right.    Referral to neurosurgery in [facility] for lumbar and cervical pain.   Conjunctivitis- sent Cipro eye drops to pharmacy.   Changed gabapentin to 300 mg 1 tablet in the morning and 2 tablets in the evening.

## 2017-08-23 NOTE — Progress Notes (Signed)
Patient: Randy Dean Male    DOB: 1966-05-13   52 y.o.   MRN: [id number] Visit Date: 08/23/2017  Today's Provider: [name], MD   Chief Complaint  Patient presents with  . Follow-up   Subjective:    HPI  Acute bilateral low back pain with right-sided sciatica From 07/20/2017-patient was given predniSONE (STERAPRED UNI-PAK 21 TAB) 10 MG (21) TBPK tablet. Ordered MR Lumbar Spine Wo Contrast-showing Bulging disc on right. Advised to keep f/u appt unless pain is worse.  Gastroesophageal reflux disease, esophagitis presence not specified From 07/20/2017-no changes.   Also patient has itchy eyes with drainage for several weeks.  Allergies  Allergen Reactions  . Serzone [Nefazodone]     Tongue and facial swelling  . Codeine Nausea Only    Dizziness, sweating  . Effexor [Venlafaxine]     "Feels out of it"  . Paxil [Paroxetine Hcl]     "does not feel right in the head"  . Esomeprazole Sodium Rash     Current Outpatient Medications:  .  acetaminophen (TYLENOL) 500 MG tablet, Take 500 mg by mouth every 6 (six) hours as needed., Disp: , Rfl:  .  ALPRAZolam (XANAX) 0.5 MG tablet, Take 2 tablets approximately 45 minutes prior to the MRI study, take a third tablet if needed., Disp: 3 tablet, Rfl: 0 .  dexlansoprazole (DEXILANT) 60 MG capsule, Take 1 capsule (60 mg total) by mouth daily., Disp: 90 capsule, Rfl: 3 .  diazepam (VALIUM) 5 MG tablet, Take 1 tablet (5 mg total) by mouth See admin instructions. 1 PO 1 hour prior to procedure, may repeat if needed, Disp: 3 tablet, Rfl: 0 .  fluticasone (FLONASE) 50 MCG/ACT nasal spray, Place 2 sprays into both nostrils 2 (two) times daily., Disp: 96 g, Rfl: 3 .  loratadine (CLARITIN) 10 MG tablet, Take 10 mg by mouth daily., Disp: , Rfl:  .  meloxicam (MOBIC) 15 MG tablet, Take 1 tablet (15 mg total) by mouth daily., Disp: 90 tablet, Rfl: 3 .  nystatin (MYCOSTATIN) 100000 UNIT/ML suspension, Take 5 mLs (500,000 Units total)  by mouth 4 (four) times daily. Swish/gargle and spit, Disp: 120 mL, Rfl: 0 .  sertraline (ZOLOFT) 50 MG tablet, Take 1 tablet (50 mg total) by mouth daily., Disp: 90 tablet, Rfl: 3 .  terbinafine (LAMISIL) 250 MG tablet, Take 2 tablets daily for one week out of each month, Disp: 28 tablet, Rfl: 6 .  tiZANidine (ZANAFLEX) 4 MG tablet, Take 1 tablet (4 mg total) by mouth 3 (three) times daily., Disp: 90 tablet, Rfl: 1 .  amoxicillin-clavulanate (AUGMENTIN) 875-125 MG tablet, Take 1 tablet by mouth 2 (two) times daily. (Patient not taking: Reported on 07/20/2017), Disp: 20 tablet, Rfl: 0 .  gabapentin (NEURONTIN) 300 MG capsule, Take 1 capsule (300 mg total) by mouth 2 (two) times daily. (Patient not taking: Reported on 07/20/2017), Disp: 180 capsule, Rfl: 3 .  predniSONE (STERAPRED UNI-PAK 21 TAB) 10 MG (21) TBPK tablet, Take 6 tablets on the first day then decrease by one daily until finished. (Patient not taking: Reported on 08/23/2017), Disp: 21 tablet, Rfl: 0 .  sucralfate (CARAFATE) 1 g tablet, Take 1 tablet (1 g total) by mouth 4 (four) times daily -  with meals and at bedtime. (Patient not taking: Reported on 08/23/2017), Disp: 360 tablet, Rfl: 3  Review of Systems  Constitutional: Negative for appetite change, chills and fever.  Eyes: Positive for discharge and itching.  Respiratory:  Negative for chest tightness, shortness of breath and wheezing.   Cardiovascular: Negative for chest pain and palpitations.  Gastrointestinal: Negative for abdominal pain, nausea and vomiting.  Endocrine: Negative.   Allergic/Immunologic: Negative.   Psychiatric/Behavioral: Negative.     Social History   Tobacco Use  . Smoking status: Former Smoker    Packs/day: 0.50    Years: 3.00    Pack years: 1.50    Last attempt to quit: 06/28/1984    Years since quitting: 33.1  . Smokeless tobacco: Current User    Types: Snuff    Last attempt to quit: 06/29/1986  . Tobacco comment: 1 can dip/day  Substance Use  Topics  . Alcohol use: No    Alcohol/week: 0.0 oz   Objective:   BP 120/84 (BP Location: Right Arm, Patient Position: Sitting, Cuff Size: Large)   Pulse 70   Temp 98 F (36.7 C) (Oral)   Resp 16   Ht 6' (1.829 m)   Wt 225 lb (102.1 kg)   SpO2 98%   BMI 30.52 kg/m  Vitals:   08/23/17 1039  BP: 120/84  Pulse: 70  Resp: 16  Temp: 98 F (36.7 C)  TempSrc: Oral  SpO2: 98%  Weight: 225 lb (102.1 kg)  Height: 6' (1.829 m)     Physical Exam  Constitutional: He is oriented to person, place, and time. He appears well-developed and well-nourished.  HENT:  Head: Normocephalic and atraumatic.  Eyes: Conjunctivae are normal.  Neck: Neck supple. No thyromegaly present.  Cardiovascular: Normal rate, regular rhythm and normal heart sounds.  Pulmonary/Chest: Effort normal and breath sounds normal.  Abdominal: Soft.  Neurological: He is oriented to person, place, and time.  Strength 5-/5 in RUE./RLE.  Skin: Skin is warm and dry.  Psychiatric: He has a normal mood and affect. His behavior is normal. Judgment and thought content normal.        Assessment & Plan:     1. Spinal stenosis in cervical region  - Ambulatory referral to Neurosurgery  2. Lumbar back pain  - Ambulatory referral to Neurosurgery  3. Cervical neuropathy   4. Acute bacterial conjunctivitis of both eyes  - ciprofloxacin (CILOXAN) 0.3 % ophthalmic solution 2 drop 5.TBI--chronic See neurology evaluation. 6.MDD/GAD Chronic issue.      Kathrin Folden Cranford Mon, MD  [facility]

## 2017-08-25 ENCOUNTER — Telehealth: Payer: Self-pay | Admitting: Family Medicine

## 2017-08-25 DIAGNOSIS — H1033 Unspecified acute conjunctivitis, bilateral: Secondary | ICD-10-CM

## 2017-08-25 MED ORDER — CIPROFLOXACIN HCL 0.3 % OP SOLN: 2.0000 [drp] | OPHTHALMIC | 0 refills | Status: DC

## 2017-08-25 NOTE — Telephone Encounter (Signed)
Pt states the new RX for eye drops (ciprofloxacin (CILOXAN) 0.3 % ophthalmic solution 2 drop  [[id number]] ) have not been called into [facility].

## 2017-08-25 NOTE — Telephone Encounter (Signed)
Resent. Was in as a clinic admin med. LM letting pt know it was sent and apologized.

## 2017-08-27 ENCOUNTER — Telehealth: Payer: Self-pay | Admitting: Family Medicine

## 2017-08-27 NOTE — Telephone Encounter (Signed)
Pt stated he would like Judson Roch to return his call to discuss the referral for Neurosurgery. Please advise. Thanks TNP

## 2017-08-30 NOTE — Telephone Encounter (Signed)
Pt advised that Dr Dicie Beam office will contact him with appointment

## 2017-09-03 ENCOUNTER — Telehealth: Payer: Self-pay

## 2017-09-03 NOTE — Telephone Encounter (Signed)
Pt requesting a call back from [place].

## 2017-10-04 DIAGNOSIS — M5442 Lumbago with sciatica, left side: Secondary | ICD-10-CM | POA: Diagnosis not present

## 2017-10-04 DIAGNOSIS — S12600A Unspecified displaced fracture of seventh cervical vertebra, initial encounter for closed fracture: Secondary | ICD-10-CM | POA: Diagnosis not present

## 2017-10-04 DIAGNOSIS — R2681 Unsteadiness on feet: Secondary | ICD-10-CM | POA: Diagnosis not present

## 2017-10-04 DIAGNOSIS — M5441 Lumbago with sciatica, right side: Secondary | ICD-10-CM | POA: Diagnosis not present

## 2017-10-04 DIAGNOSIS — Z885 Allergy status to narcotic agent status: Secondary | ICD-10-CM | POA: Diagnosis not present

## 2017-10-04 DIAGNOSIS — M5031 Other cervical disc degeneration,  high cervical region: Secondary | ICD-10-CM | POA: Diagnosis not present

## 2017-10-04 DIAGNOSIS — M5033 Other cervical disc degeneration, cervicothoracic region: Secondary | ICD-10-CM | POA: Diagnosis not present

## 2017-10-04 DIAGNOSIS — Z888 Allergy status to other drugs, medicaments and biological substances status: Secondary | ICD-10-CM | POA: Diagnosis not present

## 2017-10-04 DIAGNOSIS — M545 Low back pain: Secondary | ICD-10-CM | POA: Diagnosis not present

## 2017-10-04 DIAGNOSIS — G959 Disease of spinal cord, unspecified: Secondary | ICD-10-CM | POA: Diagnosis not present

## 2017-10-04 DIAGNOSIS — Z981 Arthrodesis status: Secondary | ICD-10-CM | POA: Diagnosis not present

## 2017-10-04 DIAGNOSIS — M79601 Pain in right arm: Secondary | ICD-10-CM | POA: Diagnosis not present

## 2017-10-21 DIAGNOSIS — M47812 Spondylosis without myelopathy or radiculopathy, cervical region: Secondary | ICD-10-CM | POA: Diagnosis not present

## 2017-10-21 DIAGNOSIS — M4802 Spinal stenosis, cervical region: Secondary | ICD-10-CM | POA: Diagnosis not present

## 2017-10-21 DIAGNOSIS — G959 Disease of spinal cord, unspecified: Secondary | ICD-10-CM | POA: Diagnosis not present

## 2017-10-21 DIAGNOSIS — M47892 Other spondylosis, cervical region: Secondary | ICD-10-CM | POA: Diagnosis not present

## 2017-12-15 DIAGNOSIS — F4310 Post-traumatic stress disorder, unspecified: Secondary | ICD-10-CM | POA: Diagnosis not present

## 2017-12-15 DIAGNOSIS — M47816 Spondylosis without myelopathy or radiculopathy, lumbar region: Secondary | ICD-10-CM | POA: Diagnosis not present

## 2017-12-15 DIAGNOSIS — Z6829 Body mass index (BMI) 29.0-29.9, adult: Secondary | ICD-10-CM | POA: Diagnosis not present

## 2017-12-15 DIAGNOSIS — M5416 Radiculopathy, lumbar region: Secondary | ICD-10-CM | POA: Diagnosis not present

## 2017-12-15 DIAGNOSIS — G9589 Other specified diseases of spinal cord: Secondary | ICD-10-CM | POA: Diagnosis not present

## 2017-12-15 DIAGNOSIS — G8921 Chronic pain due to trauma: Secondary | ICD-10-CM | POA: Diagnosis not present

## 2018-02-21 DIAGNOSIS — H35351 Cystoid macular degeneration, right eye: Secondary | ICD-10-CM | POA: Diagnosis not present

## 2018-02-21 DIAGNOSIS — H53032 Strabismic amblyopia, left eye: Secondary | ICD-10-CM | POA: Diagnosis not present

## 2018-02-21 DIAGNOSIS — H1045 Other chronic allergic conjunctivitis: Secondary | ICD-10-CM | POA: Diagnosis not present

## 2018-03-02 ENCOUNTER — Ambulatory Visit (INDEPENDENT_AMBULATORY_CARE_PROVIDER_SITE_OTHER): Payer: PPO | Admitting: Family Medicine

## 2018-03-02 VITALS — BP 136/72 | HR 64 | Temp 98.2°F | Resp 16 | Wt 215.0 lb

## 2018-03-02 DIAGNOSIS — M5000 Cervical disc disorder with myelopathy, unspecified cervical region: Secondary | ICD-10-CM

## 2018-03-02 DIAGNOSIS — J329 Chronic sinusitis, unspecified: Secondary | ICD-10-CM | POA: Diagnosis not present

## 2018-03-02 DIAGNOSIS — Z1211 Encounter for screening for malignant neoplasm of colon: Secondary | ICD-10-CM

## 2018-03-02 DIAGNOSIS — H1033 Unspecified acute conjunctivitis, bilateral: Secondary | ICD-10-CM

## 2018-03-02 MED ORDER — DOXYCYCLINE HYCLATE 100 MG PO TABS: 100.0000 mg | ORAL_TABLET | Freq: Two times a day (BID) | ORAL | 0 refills | Status: DC

## 2018-03-02 NOTE — Progress Notes (Signed)
Randy Dean  MRN: [id number] DOB: 24-Mar-1966  Subjective:  HPI   The patient is a 52 year old male who presents for evaluation of sinus problem.  He has been having sinus headaches.   He has also complained of eye drainage, itching and matting.  His eye doctor has just called in a second type of eye drops for him to use.   Patient Active Problem List   Diagnosis Date Noted  . Prediabetes 10/21/2016  . Abnormality of gait 10/30/2015  . Headache 10/30/2015  . Anxiety and depression 11/23/2014  . Brain injuries ([place]) 11/23/2014  . Clinical depression 11/23/2014  . HLD (hyperlipidemia) 11/23/2014  . Insomnia due to medical condition 11/23/2014  . Injury of face and neck 11/23/2014  . Cervical pain 11/23/2014  . Loss of feeling or sensation 11/23/2014  . Tingling 11/23/2014  . Adiposity 11/23/2014  . Allergic rhinitis, seasonal 11/23/2014  . Neurosis, posttraumatic 11/23/2014  . Spinal stenosis in cervical region 11/23/2014  . Injury brain, traumatic ([place]) 11/23/2014  . Has a tremor 11/23/2014  . Disease of vein 11/23/2014  . Head revolving around 11/23/2014  . Shortness of breath 03/14/2013  . Anxiety 10/11/2008  . Intervertebral cervical disc disorder with myelopathy, cervical region 10/11/2008  . Musculoskeletal disorder and symptoms referable to neck 10/11/2008  . Acid reflux 10/11/2008    Past Medical History:  Diagnosis Date  . Abnormality of gait 10/30/2015  . Allergic rhinitis   . Anxiety   . Chronic headaches    partially sinus/partially brain injury (per pt)  . GERD (gastroesophageal reflux disease)   . Head trauma 2010   has caused anxiety, memory issues and neck problems  . Headache 10/30/2015   Right temporal   . Neck stiffness    metal plate.  mild limitations of side to side and up and dowm movement  . Prediabetes   . PTSD (post-traumatic stress disorder)   . Stroke [facility])    TIA x3- some residual right side weakness  . Vertigo   . Weakness of  right side of body    from TIAs    Social History   Socioeconomic History  . Marital status: Married    Spouse name: Lattie Haw  . Number of children: 0  . Years of education: 78  . Highest education level: Not on file  Occupational History  . Not on file  Social Needs  . Financial resource strain: Not on file  . Food insecurity:    Worry: Not on file    Inability: Not on file  . Transportation needs:    Medical: Not on file    Non-medical: Not on file  Tobacco Use  . Smoking status: Former Smoker    Packs/day: 0.50    Years: 3.00    Pack years: 1.50    Last attempt to quit: 06/28/1984    Years since quitting: 33.6  . Smokeless tobacco: Current User    Types: Snuff    Last attempt to quit: 06/29/1986  . Tobacco comment: 1 can dip/day  Substance and Sexual Activity  . Alcohol use: No    Alcohol/week: 0.0 standard drinks  . Drug use: No  . Sexual activity: Not on file  Lifestyle  . Physical activity:    Days per week: Not on file    Minutes per session: Not on file  . Stress: Not on file  Relationships  . Social connections:    Talks on phone: Not on file  Gets together: Not on file    Attends religious service: Not on file    Active member of club or organization: Not on file    Attends meetings of clubs or organizations: Not on file    Relationship status: Not on file  . Intimate partner violence:    Fear of current or ex partner: Not on file    Emotionally abused: Not on file    Physically abused: Not on file    Forced sexual activity: Not on file  Other Topics Concern  . Not on file  Social History Narrative   Lives at home with his wife Lattie Haw   Right-handed   Drinks tea or soda about 6 times per day    Outpatient Encounter Medications as of 03/02/2018  Medication Sig  . acetaminophen (TYLENOL) 500 MG tablet Take 500 mg by mouth every 6 (six) hours as needed.  . ALPRAZolam (XANAX) 0.5 MG tablet Take 2 tablets approximately 45 minutes prior to the MRI study,  take a third tablet if needed.  Marland Kitchen dexlansoprazole (DEXILANT) 60 MG capsule Take 1 capsule (60 mg total) by mouth daily.  . diazepam (VALIUM) 5 MG tablet Take 1 tablet (5 mg total) by mouth See admin instructions. 1 PO 1 hour prior to procedure, may repeat if needed  . fluticasone (FLONASE) 50 MCG/ACT nasal spray Place 2 sprays into both nostrils 2 (two) times daily.  Marland Kitchen loratadine (CLARITIN) 10 MG tablet Take 10 mg by mouth daily.  . meloxicam (MOBIC) 15 MG tablet Take 1 tablet (15 mg total) by mouth daily.  . sertraline (ZOLOFT) 50 MG tablet Take 1 tablet (50 mg total) by mouth daily.  Marland Kitchen tiZANidine (ZANAFLEX) 4 MG tablet Take 1 tablet (4 mg total) by mouth 3 (three) times daily.  . [DISCONTINUED] amoxicillin-clavulanate (AUGMENTIN) 875-125 MG tablet Take 1 tablet by mouth 2 (two) times daily. (Patient not taking: Reported on 07/20/2017)  . [DISCONTINUED] ciprofloxacin (CILOXAN) 0.3 % ophthalmic solution Place 2 drops into both eyes every 2 (two) hours. See pharmacy notes  . [DISCONTINUED] gabapentin (NEURONTIN) 300 MG capsule Take 1 capsule (300 mg total) by mouth 2 (two) times daily. (Patient taking differently: Take 300 mg by mouth 3 (three) times daily. Take 1 tablet in the morning)  . [DISCONTINUED] nystatin (MYCOSTATIN) 100000 UNIT/ML suspension Take 5 mLs (500,000 Units total) by mouth 4 (four) times daily. Swish/gargle and spit  . [DISCONTINUED] predniSONE (STERAPRED UNI-PAK 21 TAB) 10 MG (21) TBPK tablet Take 6 tablets on the first day then decrease by one daily until finished. (Patient not taking: Reported on 08/23/2017)  . [DISCONTINUED] sucralfate (CARAFATE) 1 g tablet Take 1 tablet (1 g total) by mouth 4 (four) times daily -  with meals and at bedtime. (Patient not taking: Reported on 08/23/2017)   No facility-administered encounter medications on file as of 03/02/2018.     Allergies  Allergen Reactions  . Serzone [Nefazodone]     Tongue and facial swelling  . Codeine Nausea Only     Dizziness, sweating  . Effexor [Venlafaxine]     "Feels out of it"  . Paxil [Paroxetine Hcl]     "does not feel right in the head"  . Esomeprazole Sodium Rash    Review of Systems  Constitutional: Positive for malaise/fatigue. Negative for chills, diaphoresis, fever and weight loss.  HENT: Positive for congestion, sinus pain and tinnitus (chronic). Negative for ear discharge, ear pain, hearing loss, nosebleeds and sore throat.   Eyes: Positive for blurred  vision, photophobia, pain, discharge and redness. Negative for double vision.  Respiratory: Negative for cough, shortness of breath and wheezing.   Cardiovascular: Negative for chest pain.  Gastrointestinal: Negative.   Musculoskeletal: Positive for back pain.  Neurological: Positive for tremors.  Endo/Heme/Allergies: Negative.   Psychiatric/Behavioral: Negative.     Objective:  BP 136/72 (BP Location: Right Arm, Patient Position: Sitting, Cuff Size: Normal)   Pulse 64   Temp 98.2 F (36.8 C) (Oral)   Resp 16   Wt 215 lb (97.5 kg)   BMI 29.16 kg/m   Physical Exam  Constitutional: He is oriented to person, place, and time and well-developed, well-nourished, and in no distress.  HENT:  Head: Normocephalic and atraumatic.  Right Ear: External ear normal.  Left Ear: External ear normal.  Nose: Nose normal.  Mouth/Throat: Oropharynx is clear and moist.  Maxillary sinus tenderness.  Eyes: No scleral icterus.  Bilateral conjunctivitis --L>R.  Neck: No thyromegaly present.  Cardiovascular: Normal rate, regular rhythm and normal heart sounds.  Pulmonary/Chest: Effort normal and breath sounds normal.  Abdominal: Soft.  Neurological: He is alert and oriented to person, place, and time. Gait normal. GCS score is 15.  Spastic movements of all limbs.  Skin: Skin is warm and dry.  Psychiatric: Mood, memory, affect and judgment normal.    Assessment and Plan :  1. Sinusitis, unspecified chronicity, unspecified location  -  doxycycline (VIBRA-TABS) 100 MG tablet; Take 1 tablet (100 mg total) by mouth 2 (two) times daily.  Dispense: 20 tablet; Refill: 0  2. Colon cancer screening  - Ambulatory referral to Gastroenterology  3. Acute bacterial conjunctivitis of both eyes   4. Intervertebral cervical disc disorder with myelopathy, cervical region  I have done the exam and reviewed the chart and it is accurate to the best of my knowledge. Development worker, community has been used and  any errors in dictation or transcription are unintentional. Miguel Aschoff M.D. [facility]   - doxycycline (VIBRA-TABS) 100 MG tablet; Take 1 tablet (100 mg total) by mouth 2 (two) times daily.  Dispense: 20 tablet; Refill: 0  - Ambulatory referral to Gastroenterology

## 2018-03-04 DIAGNOSIS — H1045 Other chronic allergic conjunctivitis: Secondary | ICD-10-CM | POA: Diagnosis not present

## 2018-03-04 DIAGNOSIS — H53032 Strabismic amblyopia, left eye: Secondary | ICD-10-CM | POA: Diagnosis not present

## 2018-03-09 ENCOUNTER — Telehealth: Payer: Self-pay | Admitting: Family Medicine

## 2018-03-09 ENCOUNTER — Telehealth: Payer: Self-pay

## 2018-03-09 NOTE — Telephone Encounter (Signed)
LVM for pt to call back regarding referral (Colonoscopy).  Thanks [employer]

## 2018-03-09 NOTE — Telephone Encounter (Signed)
Please review. Thanks!  

## 2018-03-09 NOTE — Telephone Encounter (Signed)
Pt states he is having thrush due to the antibiotics.  Pt states he would like a flavoring in the swish medication like bubble gum or cherry.  Pt requesting medication be sent to [facility].   Pt also stated he is having trouble getting in touch with the GI group to schedule this colonoscopy.  States they play phone tag and he can never get them.  He states he is not sure what to do.

## 2018-03-11 DIAGNOSIS — H1045 Other chronic allergic conjunctivitis: Secondary | ICD-10-CM | POA: Diagnosis not present

## 2018-03-11 NOTE — Telephone Encounter (Signed)
[facility] called wanting to know if we can send an order of nystatin oral for thrush caused by antibiotic.to them  Patient was thinking Dr Rosanna Randy had sent in a prescription  Call back  [phone]

## 2018-03-14 MED ORDER — NYSTATIN 100000 UNIT/ML MT SUSP: 5.0000 mL | Freq: Four times a day (QID) | OROMUCOSAL | 0 refills | Status: DC

## 2018-03-14 NOTE — Telephone Encounter (Signed)
Please advise 

## 2018-03-14 NOTE — Telephone Encounter (Signed)
OK--5cc swich and spit QID,#120cc

## 2018-03-14 NOTE — Telephone Encounter (Signed)
done

## 2018-03-22 ENCOUNTER — Encounter: Payer: Self-pay | Admitting: *Deleted

## 2018-03-22 ENCOUNTER — Encounter: Payer: Self-pay | Admitting: Family Medicine

## 2018-04-06 DIAGNOSIS — H26491 Other secondary cataract, right eye: Secondary | ICD-10-CM | POA: Diagnosis not present

## 2018-04-06 DIAGNOSIS — H1033 Unspecified acute conjunctivitis, bilateral: Secondary | ICD-10-CM | POA: Diagnosis not present

## 2018-04-11 ENCOUNTER — Emergency Department: Payer: PPO

## 2018-04-11 ENCOUNTER — Encounter: Payer: Self-pay | Admitting: *Deleted

## 2018-04-11 ENCOUNTER — Inpatient Hospital Stay
Admission: EM | Admit: 2018-04-11 | Discharge: 2018-04-14 | DRG: 392 | Disposition: A | Discharge status: Home / Self Care | Payer: PPO | Source: Ambulatory Visit | Attending: Surgery | Admitting: Surgery

## 2018-04-11 ENCOUNTER — Encounter: Payer: Self-pay | Admitting: Family Medicine

## 2018-04-11 ENCOUNTER — Ambulatory Visit (INDEPENDENT_AMBULATORY_CARE_PROVIDER_SITE_OTHER): Payer: PPO | Admitting: Family Medicine

## 2018-04-11 VITALS — BP 130/74 | HR 102 | Temp 101.7°F | Resp 24 | Wt 220.0 lb

## 2018-04-11 DIAGNOSIS — F4310 Post-traumatic stress disorder, unspecified: Secondary | ICD-10-CM | POA: Diagnosis not present

## 2018-04-11 DIAGNOSIS — F419 Anxiety disorder, unspecified: Secondary | ICD-10-CM | POA: Diagnosis present

## 2018-04-11 DIAGNOSIS — Z23 Encounter for immunization: Secondary | ICD-10-CM

## 2018-04-11 DIAGNOSIS — R509 Fever, unspecified: Secondary | ICD-10-CM | POA: Diagnosis not present

## 2018-04-11 DIAGNOSIS — Z79899 Other long term (current) drug therapy: Secondary | ICD-10-CM | POA: Diagnosis not present

## 2018-04-11 DIAGNOSIS — R1032 Left lower quadrant pain: Secondary | ICD-10-CM | POA: Diagnosis not present

## 2018-04-11 DIAGNOSIS — Z833 Family history of diabetes mellitus: Secondary | ICD-10-CM

## 2018-04-11 DIAGNOSIS — D72829 Elevated white blood cell count, unspecified: Secondary | ICD-10-CM | POA: Diagnosis not present

## 2018-04-11 DIAGNOSIS — E785 Hyperlipidemia, unspecified: Secondary | ICD-10-CM | POA: Diagnosis present

## 2018-04-11 DIAGNOSIS — Z888 Allergy status to other drugs, medicaments and biological substances status: Secondary | ICD-10-CM | POA: Diagnosis not present

## 2018-04-11 DIAGNOSIS — Z8673 Personal history of transient ischemic attack (TIA), and cerebral infarction without residual deficits: Secondary | ICD-10-CM | POA: Diagnosis not present

## 2018-04-11 DIAGNOSIS — Z720 Tobacco use: Secondary | ICD-10-CM | POA: Diagnosis not present

## 2018-04-11 DIAGNOSIS — Z791 Long term (current) use of non-steroidal anti-inflammatories (NSAID): Secondary | ICD-10-CM | POA: Diagnosis not present

## 2018-04-11 DIAGNOSIS — R300 Dysuria: Secondary | ICD-10-CM

## 2018-04-11 DIAGNOSIS — K219 Gastro-esophageal reflux disease without esophagitis: Secondary | ICD-10-CM | POA: Diagnosis present

## 2018-04-11 DIAGNOSIS — K5900 Constipation, unspecified: Secondary | ICD-10-CM | POA: Diagnosis not present

## 2018-04-11 DIAGNOSIS — K5732 Diverticulitis of large intestine without perforation or abscess without bleeding: Secondary | ICD-10-CM | POA: Diagnosis present

## 2018-04-11 DIAGNOSIS — N183 Chronic kidney disease, stage 3 (moderate): Secondary | ICD-10-CM | POA: Diagnosis present

## 2018-04-11 DIAGNOSIS — Z885 Allergy status to narcotic agent status: Secondary | ICD-10-CM | POA: Diagnosis not present

## 2018-04-11 DIAGNOSIS — K5720 Diverticulitis of large intestine with perforation and abscess without bleeding: Principal | ICD-10-CM | POA: Diagnosis present

## 2018-04-11 DIAGNOSIS — Z7951 Long term (current) use of inhaled steroids: Secondary | ICD-10-CM

## 2018-04-11 DIAGNOSIS — K5791 Diverticulosis of intestine, part unspecified, without perforation or abscess with bleeding: Secondary | ICD-10-CM | POA: Diagnosis not present

## 2018-04-11 DIAGNOSIS — R7303 Prediabetes: Secondary | ICD-10-CM | POA: Diagnosis present

## 2018-04-11 LAB — COMPREHENSIVE METABOLIC PANEL
ALT: 24 U/L (ref 0–44)
AST: 29 U/L (ref 15–41)
Albumin: 3.8 g/dL (ref 3.5–5.0)
Alkaline Phosphatase: 62 U/L (ref 38–126)
Anion gap: 12 (ref 5–15)
BUN: 18 mg/dL (ref 6–20)
CO2: 24 mmol/L (ref 22–32)
Calcium: 8.7 mg/dL — ABNORMAL LOW (ref 8.9–10.3)
Chloride: 100 mmol/L (ref 98–111)
Creatinine, Ser: 1.74 mg/dL — ABNORMAL HIGH (ref 0.61–1.24)
GFR calc Af Amer: 50 mL/min — ABNORMAL LOW (ref >60)
GFR calc non Af Amer: 43 mL/min — ABNORMAL LOW (ref >60)
Glucose, Bld: 84 mg/dL (ref 70–99)
Potassium: 4.6 mmol/L (ref 3.5–5.1)
Sodium: 136 mmol/L (ref 135–145)
Total Bilirubin: 2.1 mg/dL — ABNORMAL HIGH (ref 0.3–1.2)
Total Protein: 7.4 g/dL (ref 6.5–8.1)

## 2018-04-11 LAB — CBC
HCT: 42.4 % (ref 39.0–52.0)
Hemoglobin: 14.8 g/dL (ref 13.0–17.0)
MCH: 31.6 pg (ref 26.0–34.0)
MCHC: 34.9 g/dL (ref 30.0–36.0)
MCV: 90.6 fL (ref 80.0–100.0)
Platelets: 182 10*3/uL (ref 150–400)
RBC: 4.68 MIL/uL (ref 4.22–5.81)
RDW: 12.1 % (ref 11.5–15.5)
WBC: 13.2 10*3/uL — ABNORMAL HIGH (ref 4.0–10.5)
nRBC: 0 % (ref 0.0–0.2)

## 2018-04-11 LAB — URINALYSIS, COMPLETE (UACMP) WITH MICROSCOPIC
Bacteria, UA: NONE SEEN
Bilirubin Urine: NEGATIVE
Glucose, UA: NEGATIVE mg/dL
Ketones, ur: 5 mg/dL — AB
Leukocytes, UA: NEGATIVE
Nitrite: NEGATIVE
Protein, ur: 30 mg/dL — AB
Specific Gravity, Urine: 1.016 (ref 1.005–1.030)
WBC, UA: NONE SEEN WBC/hpf (ref 0–5)
pH: 5 (ref 5.0–8.0)

## 2018-04-11 LAB — LIPASE, BLOOD: Lipase: 36 U/L (ref 11–51)

## 2018-04-11 MED ORDER — HYDROMORPHONE HCL 1 MG/ML IJ SOLN
INTRAMUSCULAR | Status: AC
  Administered 2018-04-11: 0.5 mg
  Filled 2018-04-11: qty 1

## 2018-04-11 MED ORDER — ONDANSETRON HCL 4 MG/2ML IJ SOLN
4.0000 mg | Freq: Once | INTRAMUSCULAR | Status: AC
  Administered 2018-04-11: 4 mg via INTRAVENOUS
  Filled 2018-04-11: qty 2

## 2018-04-11 MED ORDER — KETOROLAC TROMETHAMINE 30 MG/ML IJ SOLN
30.0000 mg | Freq: Once | INTRAMUSCULAR | Status: AC
  Administered 2018-04-11: 30 mg via INTRAVENOUS
  Filled 2018-04-11: qty 1

## 2018-04-11 MED ORDER — SODIUM CHLORIDE 0.9 % IV BOLUS
1000.0000 mL | Freq: Once | INTRAVENOUS | Status: AC
  Administered 2018-04-11: 1000 mL via INTRAVENOUS

## 2018-04-11 MED ORDER — HYDROMORPHONE HCL 1 MG/ML IJ SOLN
0.5000 mg | INTRAMUSCULAR | Status: DC | PRN
  Administered 2018-04-12 (×2): 0.5 mg via INTRAVENOUS
  Filled 2018-04-11 (×2): qty 0.5

## 2018-04-11 MED ORDER — ONDANSETRON HCL 4 MG/2ML IJ SOLN
4.0000 mg | Freq: Four times a day (QID) | INTRAMUSCULAR | Status: DC | PRN
  Administered 2018-04-12: 4 mg via INTRAVENOUS
  Filled 2018-04-11: qty 2

## 2018-04-11 MED ORDER — HYDROMORPHONE HCL 1 MG/ML IJ SOLN
0.5000 mg | Freq: Once | INTRAMUSCULAR | Status: AC
  Administered 2018-04-11: 0.5 mg via INTRAVENOUS
  Filled 2018-04-11: qty 1

## 2018-04-11 MED ORDER — IOHEXOL 300 MG/ML  SOLN
75.0000 mL | Freq: Once | INTRAMUSCULAR | Status: AC | PRN
  Administered 2018-04-11: 75 mL via INTRAVENOUS

## 2018-04-11 MED ORDER — ONDANSETRON 4 MG PO TBDP: 4.0000 mg | ORAL_TABLET | Freq: Four times a day (QID) | ORAL | Status: DC | PRN

## 2018-04-11 MED ORDER — LACTATED RINGERS IV SOLN
INTRAVENOUS | Status: DC
  Administered 2018-04-11 – 2018-04-13 (×4): via INTRAVENOUS

## 2018-04-11 MED ORDER — FAMOTIDINE IN NACL 20-0.9 MG/50ML-% IV SOLN
20.0000 mg | Freq: Two times a day (BID) | INTRAVENOUS | Status: DC
  Administered 2018-04-11 – 2018-04-13 (×4): 20 mg via INTRAVENOUS
  Filled 2018-04-11 (×4): qty 50

## 2018-04-11 MED ORDER — INFLUENZA VAC SPLIT QUAD 0.5 ML IM SUSY
0.5000 mL | PREFILLED_SYRINGE | INTRAMUSCULAR | Status: AC
  Administered 2018-04-14: 0.5 mL via INTRAMUSCULAR
  Filled 2018-04-11: qty 0.5

## 2018-04-11 MED ORDER — PIPERACILLIN-TAZOBACTAM 3.375 G IVPB
3.3750 g | Freq: Three times a day (TID) | INTRAVENOUS | Status: DC
  Administered 2018-04-12: 3.375 g via INTRAVENOUS
  Filled 2018-04-11: qty 50

## 2018-04-11 MED ORDER — ENOXAPARIN SODIUM 40 MG/0.4ML ~~LOC~~ SOLN
40.0000 mg | SUBCUTANEOUS | Status: DC
  Administered 2018-04-12 – 2018-04-13 (×2): 40 mg via SUBCUTANEOUS
  Filled 2018-04-11 (×2): qty 0.4

## 2018-04-11 MED ORDER — HYDROMORPHONE HCL 1 MG/ML IJ SOLN
0.5000 mg | Freq: Once | INTRAMUSCULAR | Status: AC | PRN
  Administered 2018-04-11: 0.5 mg via INTRAVENOUS
  Filled 2018-04-11: qty 1

## 2018-04-11 MED ORDER — PIPERACILLIN-TAZOBACTAM 4.5 G IVPB: 4.5000 g | Freq: Once | INTRAVENOUS | Status: DC

## 2018-04-11 MED ORDER — PIPERACILLIN SOD-TAZOBACTAM SO 2.25 (2-0.25) G IV SOLR
4.5000 g | Freq: Once | INTRAVENOUS | Status: AC
  Administered 2018-04-11: 4.5 g via INTRAVENOUS
  Filled 2018-04-11: qty 2.25

## 2018-04-11 MED ORDER — IOPAMIDOL (ISOVUE-300) INJECTION 61%
30.0000 mL | Freq: Once | INTRAVENOUS | Status: AC | PRN
  Administered 2018-04-11: 30 mL via ORAL

## 2018-04-11 MED ORDER — ACETAMINOPHEN 325 MG PO TABS
650.0000 mg | ORAL_TABLET | Freq: Four times a day (QID) | ORAL | Status: DC | PRN
  Administered 2018-04-11 – 2018-04-12 (×3): 650 mg via ORAL
  Filled 2018-04-11 (×4): qty 2

## 2018-04-11 MED ORDER — HYDROMORPHONE HCL 1 MG/ML IJ SOLN
0.5000 mg | Freq: Once | INTRAMUSCULAR | Status: DC
  Filled 2018-04-11: qty 1

## 2018-04-11 NOTE — ED Provider Notes (Signed)
[facility] Emergency Department Provider Note ____________________________________________   I have reviewed the triage vital signs and the triage nursing note.  HISTORY  Chief Complaint Abdominal Pain   Historian Patient  HPI Randy Dean is a 52 y.o. male with a history of GERD and anxiety presents with left lower quadrant pain since this morning, severe, sharp, somewhat waxing and waning/intermittent.  No history of kidney stones.  He has had dysuria.  No hematuria.  Nausea without vomiting.  He has chronic sinus drainage.  Denies back or flank pain.  No epigastric pain.  No vomiting blood.  No black or bloody stools.     Past Medical History:  Diagnosis Date  . Abnormality of gait 10/30/2015  . Allergic rhinitis   . Anxiety   . Chronic headaches    partially sinus/partially brain injury (per pt)  . GERD (gastroesophageal reflux disease)   . Head trauma 2010   has caused anxiety, memory issues and neck problems  . Headache 10/30/2015   Right temporal   . Neck stiffness    metal plate.  mild limitations of side to side and up and dowm movement  . Prediabetes   . PTSD (post-traumatic stress disorder)   . Stroke [facility])    TIA x3- some residual right side weakness  . Vertigo   . Weakness of right side of body    from TIAs    Patient Active Problem List   Diagnosis Date Noted  . Prediabetes 10/21/2016  . Abnormality of gait 10/30/2015  . Headache 10/30/2015  . Anxiety and depression 11/23/2014  . Brain injuries ([place]) 11/23/2014  . Clinical depression 11/23/2014  . HLD (hyperlipidemia) 11/23/2014  . Insomnia due to medical condition 11/23/2014  . Injury of face and neck 11/23/2014  . Cervical pain 11/23/2014  . Loss of feeling or sensation 11/23/2014  . Tingling 11/23/2014  . Adiposity 11/23/2014  . Allergic rhinitis, seasonal 11/23/2014  . Neurosis, posttraumatic 11/23/2014  . Spinal stenosis in cervical region 11/23/2014  .  Injury brain, traumatic ([place]) 11/23/2014  . Has a tremor 11/23/2014  . Disease of vein 11/23/2014  . Head revolving around 11/23/2014  . Shortness of breath 03/14/2013  . Anxiety 10/11/2008  . Intervertebral cervical disc disorder with myelopathy, cervical region 10/11/2008  . Musculoskeletal disorder and symptoms referable to neck 10/11/2008  . Acid reflux 10/11/2008    Past Surgical History:  Procedure Laterality Date  . BACK SURGERY    . ENDOSCOPIC TURBINATE REDUCTION Bilateral 08/22/2015   Procedure: ENDOSCOPIC TURBINATE REDUCTION;  Surgeon: Margaretha Sheffield, MD;  Location: [place];  Service: ENT;  Laterality: Bilateral;  . EYE MUSCLE SURGERY Left    Due to conential left amblyopia  . NASAL SINUS SURGERY  september 2016  . NECK SURGERY      Prior to Admission medications   Medication Sig Start Date End Date Taking? Authorizing Provider  acetaminophen (TYLENOL) 500 MG tablet Take 500 mg by mouth every 6 (six) hours as needed.    [provider]  ALPRAZolam Duanne Moron) 0.5 MG tablet Take 2 tablets approximately 45 minutes prior to the MRI study, take a third tablet if needed. 12/02/16   Kathrynn Ducking, MD  dexlansoprazole (DEXILANT) 60 MG capsule Take 1 capsule (60 mg total) by mouth daily. 07/20/17   Jerrol Banana., MD  diazepam (VALIUM) 5 MG tablet Take 1 tablet (5 mg total) by mouth See admin instructions. 1 PO 1 hour prior to procedure, may  repeat if needed 08/02/17   Jerrol Banana., MD  doxycycline (VIBRA-TABS) 100 MG tablet Take 1 tablet (100 mg total) by mouth 2 (two) times daily. Patient not taking: Reported on 04/11/2018 03/02/18   Jerrol Banana., MD  fluticasone [facility]) 50 MCG/ACT nasal spray Place 2 sprays into both nostrils 2 (two) times daily. 04/22/17   Jerrol Banana., MD  loratadine (CLARITIN) 10 MG tablet Take 10 mg by mouth daily.    [provider]  meloxicam (MOBIC) 15 MG tablet Take 1 tablet (15 mg total) by  mouth daily. 04/20/17   Jerrol Banana., MD  nystatin (MYCOSTATIN) 100000 UNIT/ML suspension Take 5 mLs (500,000 Units total) by mouth 4 (four) times daily. Patient not taking: Reported on 04/11/2018 03/14/18   Jerrol Banana., MD  sertraline (ZOLOFT) 50 MG tablet Take 1 tablet (50 mg total) by mouth daily. 04/20/17   Jerrol Banana., MD  tiZANidine (ZANAFLEX) 4 MG tablet Take 1 tablet (4 mg total) by mouth 3 (three) times daily. 04/20/17   Jerrol Banana., MD    Allergies  Allergen Reactions  . Serzone [Nefazodone]     Tongue and facial swelling  . Codeine Nausea Only    Dizziness, sweating  . Effexor [Venlafaxine]     "Feels out of it"  . Paxil [Paroxetine Hcl]     "does not feel right in the head"  . Esomeprazole Sodium Rash    Family History  Problem Relation Age of Onset  . Fibromyalgia Mother   . Heart disease Father   . Tremor Father   . Diabetes Maternal Grandmother   . Hypertension Maternal Grandmother   . Hypertension Maternal Grandfather   . Diabetes Paternal Grandmother   . Hypertension Paternal Grandmother   . Hypertension Paternal Grandfather     Social History Social History   Tobacco Use  . Smoking status: Former Smoker    Packs/day: 0.50    Years: 3.00    Pack years: 1.50    Last attempt to quit: 06/28/1984    Years since quitting: 33.8  . Smokeless tobacco: Current User    Types: Snuff    Last attempt to quit: 06/29/1986  . Tobacco comment: 1 can dip/day  Substance Use Topics  . Alcohol use: No    Alcohol/week: 0.0 standard drinks  . Drug use: No    Review of Systems  Constitutional: Negative for fever. Eyes: Negative for visual changes. ENT: Negative for sore throat. Cardiovascular: Negative for chest pain. Respiratory: Negative for shortness of breath. Gastrointestinal: Negative for diarrhea. Genitourinary: Positive as per HPI for dysuria. Musculoskeletal: Negative for back pain. Skin: Negative for  rash. Neurological: Negative for headache.  ____________________________________________   PHYSICAL EXAM:  VITAL SIGNS: ED Triage Vitals  Enc Vitals Group     BP 04/11/18 1203 134/84     Pulse Rate 04/11/18 1203 85     Resp 04/11/18 1203 (!) 22     Temp 04/11/18 1203 98.2 F (36.8 C)     Temp Source 04/11/18 1203 Oral     SpO2 04/11/18 1203 100 %     Weight 04/11/18 1204 220 lb (99.8 kg)     Height --      Head Circumference --      Peak Flow --      Pain Score 04/11/18 1204 10     Pain Loc --      Pain Edu? --  Excl. in [place]? --      Constitutional: Alert and oriented.  Writhing in the bed with pain referring to the left lower quadrant. HEENT      Head: Normocephalic and atraumatic.      Eyes: Conjunctivae are normal. Pupils equal and round.       Ears:         Nose: No congestion/rhinnorhea.      Mouth/Throat: Mucous membranes are moist.      Neck: No stridor. Cardiovascular/Chest: Normal rate, regular rhythm.  No murmurs, rubs, or gallops. Respiratory: Normal respiratory effort without tachypnea nor retractions. Breath sounds are clear and equal bilaterally. No wheezes/rales/rhonchi. Gastrointestinal: Soft. No distention, no guarding, no rebound.  Moderate left lower quadrant tenderness to palpation.    Genitourinary/rectal:Deferred Musculoskeletal: Nontender with normal range of motion in all extremities. No joint effusions.  No lower extremity tenderness.  No edema. Neurologic:  Normal speech and language. No gross or focal neurologic deficits are appreciated. Skin:  Skin is warm, dry and intact. No rash noted. Psychiatric: Mood and affect are normal. Speech and behavior are normal. Patient exhibits appropriate insight and judgment.   ____________________________________________  LABS (pertinent positives/negatives) I, Lisa Roca, MD the attending physician have reviewed the labs noted below.  Labs Reviewed  COMPREHENSIVE METABOLIC PANEL - Abnormal;  Notable for the following components:      Result Value   Creatinine, Ser 1.74 (*)    Calcium 8.7 (*)    Total Bilirubin 2.1 (*)    GFR calc non Af Amer 43 (*)    GFR calc Af Amer 50 (*)    All other components within normal limits  CBC - Abnormal; Notable for the following components:   WBC 13.2 (*)    All other components within normal limits  URINALYSIS, COMPLETE (UACMP) WITH MICROSCOPIC - Abnormal; Notable for the following components:   Color, Urine YELLOW (*)    APPearance CLEAR (*)    Hgb urine dipstick SMALL (*)    Ketones, ur 5 (*)    Protein, ur 30 (*)    All other components within normal limits  LIPASE, BLOOD    ____________________________________________    EKG I, Lisa Roca, MD, the attending physician have personally viewed and interpreted all ECGs.  None ____________________________________________  RADIOLOGY   [place] radiologist interpretation:  IMPRESSION: No acute abnormality of the kidneys or urinary bladder.  Mildly dilated apparently stool filled loop of bowel in the mid to lower abdomen which is tender to palpation of the overlying soft tissues. Correlation with the patient's clinical and laboratory values is needed. Abdominal CT scanning may be the most useful next imaging step.  CT abdomen pelvis with contrast: Discussed with radiologist: Sigmoid diverticular no mellitus with perforation. __________________________________________  PROCEDURES  Procedure(s) performed: None  Procedures  Critical Care performed: None   ____________________________________________  ED COURSE / ASSESSMENT AND PLAN  Pertinent labs & imaging results that were available during my care of the patient were reviewed by me and considered in my medical decision making (see chart for details).    Clinically most concerning for ureterolithiasis.  Somewhat unusual has more anterior pain then flank pain, however he has dysuria and a pain is causing him to writhe  around.  Patient received adequate pain relief after Dilaudid.  Patient has creatinine at 1.7, but looks like pretty similar to previous.  Discussed CT versus imaging for most likely presumed kidney stone with KUB/renal ultrasound and chose to proceed with that.  Radiologist,  with a CT report consistent with perforated diver reticulitis.  Initiated Zosyn and IV antibiotic.  Discussed with surgery for admission.  Patient has required multiple doses of IV narcotic for pain control.    CONSULTATIONS:   Dr. Lysle Pearl, surgery for admission.   Patient / Family / Caregiver informed of clinical course, medical decision-making process, and agree with plan.   ___________________________________________   FINAL CLINICAL IMPRESSION(S) / ED DIAGNOSES   Final diagnoses:  Diverticulitis of large intestine with perforation without bleeding      ___________________________________________         Note: This dictation was prepared with Dragon dictation. Any transcriptional errors that result from this process are unintentional    Lisa Roca, MD 04/11/18 1649

## 2018-04-11 NOTE — Progress Notes (Signed)
Chaplain responded to an OR for an AD. Chaplain gave education for an AD. Wife(Tammy) was at the bedside. Very nice family who is [place]. Pt talked about his [place] experience and is knowledgeable of the faith. Chaplain offered prayer and was accepted. Chaplain prayed for care and family.     04/11/18 2000  Clinical Encounter Type  Visited With Patient and family together  Visit Type Initial  Referral From Nurse  Spiritual Encounters  Spiritual Needs Brochure;Prayer

## 2018-04-11 NOTE — Progress Notes (Signed)
Patient: Randy Dean Male    DOB: 1966/03/23   52 y.o.   MRN: [id number] Visit Date: 04/11/2018  Today's Provider: [name], MD   Chief Complaint  Patient presents with  . Abdominal Pain   Subjective:    HPI Patient comes in today c/o abdominal pain X 3 days. He reports that the pain is located in his lower left side. He reports that he has only taken Tylenol with no relief. He reports that the pain is severe when palpitating.  His wife is with him and reports that the pain and the fever started at the same time 2 days ago.    Allergies  Allergen Reactions  . Serzone [Nefazodone]     Tongue and facial swelling  . Codeine Nausea Only    Dizziness, sweating  . Effexor [Venlafaxine]     "Feels out of it"  . Paxil [Paroxetine Hcl]     "does not feel right in the head"  . Esomeprazole Sodium Rash     Current Outpatient Medications:  .  acetaminophen (TYLENOL) 500 MG tablet, Take 500 mg by mouth every 6 (six) hours as needed., Disp: , Rfl:  .  ALPRAZolam (XANAX) 0.5 MG tablet, Take 2 tablets approximately 45 minutes prior to the MRI study, take a third tablet if needed., Disp: 3 tablet, Rfl: 0 .  dexlansoprazole (DEXILANT) 60 MG capsule, Take 1 capsule (60 mg total) by mouth daily., Disp: 90 capsule, Rfl: 3 .  diazepam (VALIUM) 5 MG tablet, Take 1 tablet (5 mg total) by mouth See admin instructions. 1 PO 1 hour prior to procedure, may repeat if needed, Disp: 3 tablet, Rfl: 0 .  fluticasone (FLONASE) 50 MCG/ACT nasal spray, Place 2 sprays into both nostrils 2 (two) times daily., Disp: 96 g, Rfl: 3 .  loratadine (CLARITIN) 10 MG tablet, Take 10 mg by mouth daily., Disp: , Rfl:  .  meloxicam (MOBIC) 15 MG tablet, Take 1 tablet (15 mg total) by mouth daily., Disp: 90 tablet, Rfl: 3 .  sertraline (ZOLOFT) 50 MG tablet, Take 1 tablet (50 mg total) by mouth daily., Disp: 90 tablet, Rfl: 3 .  tiZANidine (ZANAFLEX) 4 MG tablet, Take 1 tablet (4 mg total) by mouth 3  (three) times daily., Disp: 90 tablet, Rfl: 1 .  doxycycline (VIBRA-TABS) 100 MG tablet, Take 1 tablet (100 mg total) by mouth 2 (two) times daily. (Patient not taking: Reported on 04/11/2018), Disp: 20 tablet, Rfl: 0 .  nystatin (MYCOSTATIN) 100000 UNIT/ML suspension, Take 5 mLs (500,000 Units total) by mouth 4 (four) times daily. (Patient not taking: Reported on 04/11/2018), Disp: 60 mL, Rfl: 0  Review of Systems  Constitutional: Positive for chills, fatigue and fever.  Eyes: Negative.   Respiratory: Negative.   Cardiovascular: Negative.   Gastrointestinal: Positive for abdominal pain, constipation and nausea.  Endocrine: Negative.   Genitourinary: Positive for dysuria.  Musculoskeletal: Positive for myalgias. Negative for arthralgias.  Allergic/Immunologic: Negative.   Neurological: Negative for light-headedness and headaches.  Hematological: Negative.   Psychiatric/Behavioral: Negative.     Social History   Tobacco Use  . Smoking status: Former Smoker    Packs/day: 0.50    Years: 3.00    Pack years: 1.50    Last attempt to quit: 06/28/1984    Years since quitting: 33.8  . Smokeless tobacco: Current User    Types: Snuff    Last attempt to quit: 06/29/1986  . Tobacco comment: 1 can dip/day  Substance Use Topics  . Alcohol use: No    Alcohol/week: 0.0 standard drinks   Objective:   BP 130/74 (BP Location: Left Arm, Patient Position: Sitting, Cuff Size: Normal)   Pulse (!) 102   Temp (!) 101.7 F (38.7 C)   Resp (!) 24   Wt 220 lb (99.8 kg)   SpO2 100%   BMI 29.84 kg/m  Vitals:   04/11/18 1134  BP: 130/74  Pulse: (!) 102  Resp: (!) 24  Temp: (!) 101.7 F (38.7 C)  SpO2: 100%  Weight: 220 lb (99.8 kg)     Physical Exam  Constitutional: He is oriented to person, place, and time. He appears well-developed and well-nourished.  Well-nourished well-developed white male in obvious pain.  Is unable to find a comfortable position in the room.  HENT:  Head:  Normocephalic.  Eyes: No scleral icterus.  Cardiovascular: Normal rate and regular rhythm.  Pulmonary/Chest: Effort normal.  Abdominal: Bowel sounds are normal.  Left lower quadrant tenderness with apparent rebound.  Neurological: He is alert and oriented to person, place, and time.  Psychiatric: He has a normal mood and affect. His behavior is normal.        Assessment & Plan:     1. Left lower quadrant abdominal pain This time I am worried about the surgical problems such as diverticulitis in patient with some guarding and possible rebound tenderness.  To My knowledge he has not had a colonoscopy ever.  Patient referred to the ED with his wife.  2. Fever, unspecified fever cause  I have done the exam and reviewed the chart and it is accurate to the best of my knowledge. Development worker, community has been used and  any errors in dictation or transcription are unintentional. Miguel Aschoff M.D. [place], MD  [facility]

## 2018-04-11 NOTE — H&P (Addendum)
Subjective:   CC: Sigmoid diverticulitis  HPI:  Randy Dean is a 52 y.o. male who is consulted by Reita Cliche for evaluation of  above cc.  Symptoms were first noted 1 day ago. Pain is sharp, localized to left lower quadrant.  Associated with constipation, exacerbated by nothing specific.  He states that he has had similar episodes in the past that resolved on its own but this episode has been the worst so far.  Patient was scheduled for his first colonoscopy in a couple weeks.  Denies melena hematochezia or weight loss.     Past Medical History:  has a past medical history of Abnormality of gait (10/30/2015), Allergic rhinitis, Anxiety, Chronic headaches, GERD (gastroesophageal reflux disease), Head trauma (2010), Headache (10/30/2015), Neck stiffness, Prediabetes, PTSD (post-traumatic stress disorder), Stroke ([place]), Vertigo, and Weakness of right side of body.  Past Surgical History:  has a past surgical history that includes Neck surgery; Back surgery; Eye muscle surgery (Left); Nasal sinus surgery (september 2016); and Endoscopic turbinate reduction (Bilateral, 08/22/2015).  Family History: family history includes Diabetes in his maternal grandmother and paternal grandmother; Fibromyalgia in his mother; Heart disease in his father; Hypertension in his maternal grandfather, maternal grandmother, paternal grandfather, and paternal grandmother; Tremor in his father.  Social History:  reports that he quit smoking about 33 years ago. He has a 1.50 pack-year smoking history. His smokeless tobacco use includes snuff. He reports that he does not drink alcohol or use drugs.  Current Medications:  Medications Prior to Admission  Medication Sig Dispense Refill  . acetaminophen (TYLENOL) 500 MG tablet Take 500 mg by mouth every 6 (six) hours as needed for mild pain.     Marland Kitchen dexlansoprazole (DEXILANT) 60 MG capsule Take 1 capsule (60 mg total) by mouth daily. 90 capsule 3  . fluticasone (FLONASE) 50 MCG/ACT  nasal spray Place 2 sprays into both nostrils 2 (two) times daily. 96 g 3  . loratadine (CLARITIN) 10 MG tablet Take 10 mg by mouth daily.    Marland Kitchen ALPRAZolam (XANAX) 0.5 MG tablet Take 2 tablets approximately 45 minutes prior to the MRI study, take a third tablet if needed. (Patient not taking: Reported on 04/11/2018) 3 tablet 0  . diazepam (VALIUM) 5 MG tablet Take 1 tablet (5 mg total) by mouth See admin instructions. 1 PO 1 hour prior to procedure, may repeat if needed (Patient not taking: Reported on 04/11/2018) 3 tablet 0  . doxycycline (VIBRA-TABS) 100 MG tablet Take 1 tablet (100 mg total) by mouth 2 (two) times daily. (Patient not taking: Reported on 04/11/2018) 20 tablet 0  . meloxicam (MOBIC) 15 MG tablet Take 1 tablet (15 mg total) by mouth daily. (Patient not taking: Reported on 04/11/2018) 90 tablet 3  . nystatin (MYCOSTATIN) 100000 UNIT/ML suspension Take 5 mLs (500,000 Units total) by mouth 4 (four) times daily. (Patient not taking: Reported on 04/11/2018) 60 mL 0  . sertraline (ZOLOFT) 50 MG tablet Take 1 tablet (50 mg total) by mouth daily. (Patient not taking: Reported on 04/11/2018) 90 tablet 3  . tiZANidine (ZANAFLEX) 4 MG tablet Take 1 tablet (4 mg total) by mouth 3 (three) times daily. (Patient not taking: Reported on 04/11/2018) 90 tablet 1    Allergies:  Allergies as of 04/11/2018 - Review Complete 04/11/2018  Allergen Reaction Noted  . Serzone [nefazodone]  03/14/2013  . Codeine Nausea Only 03/14/2013  . Effexor [venlafaxine]  07/02/2016  . Paxil [paroxetine hcl]  07/02/2016  . Esomeprazole sodium Rash 01/02/2016  ROS:  A 15 point review of systems was performed and pertinent positives and negatives noted in HPI    Objective:     BP 133/75 (BP Location: Left Arm)   Pulse 72   Temp 99.2 F (37.3 C) (Oral)   Resp 16   Wt 99.8 kg   SpO2 95%   BMI 29.84 kg/m    Constitutional :  alert, cooperative, appears stated age and no distress  Lymphatics/Throat:  no  asymmetry, masses, or scars  Respiratory:  clear to auscultation bilaterally  Cardiovascular:  regular rate and rhythm  Gastrointestinal: Soft, no guarding, but focal tenderness noted in the left lower quadrant.   Musculoskeletal: Steady gait and movement  Skin: Cool and moist  Psychiatric: Normal affect, non-agitated, not confused       LABS:  CMP Latest Ref Rng & Units 04/11/2018 10/21/2016 12/26/2015  Glucose 70 - 99 mg/dL 84 98 121(H)  BUN 6 - 20 mg/dL 18 14 16   Creatinine 0.61 - 1.24 mg/dL 1.74(H) 1.67(H) 1.56(H)  Sodium 135 - 145 mmol/L 136 142 138  Potassium 3.5 - 5.1 mmol/L 4.6 4.6 3.7  Chloride 98 - 111 mmol/L 100 103 109  CO2 22 - 32 mmol/L 24 28 20(L)  Calcium 8.9 - 10.3 mg/dL 8.7(L) 9.1 9.3  Total Protein 6.5 - 8.1 g/dL 7.4 6.5 -  Total Bilirubin 0.3 - 1.2 mg/dL 2.1(H) 0.4 -  Alkaline Phos 38 - 126 U/L 62 71 -  AST 15 - 41 U/L 29 18 -  ALT 0 - 44 U/L 24 17 -   CBC Latest Ref Rng & Units 04/11/2018 10/21/2016 12/26/2015  WBC 4.0 - 10.5 K/uL 13.2(H) 5.4 5.1  Hemoglobin 13.0 - 17.0 g/dL 14.8 15.0 15.5  Hematocrit 39.0 - 52.0 % 42.4 42.7 44.2  Platelets 150 - 400 K/uL 182 172 180     RADS: CLINICAL DATA:  52 year old male with left lower quadrant pain. Subsequent encounter.  EXAM: CT ABDOMEN AND PELVIS WITH CONTRAST  TECHNIQUE: Multidetector CT imaging of the abdomen and pelvis was performed using the standard protocol following bolus administration of intravenous contrast.  CONTRAST:  80mL OMNIPAQUE IOHEXOL 300 MG/ML  SOLN  COMPARISON:  04/11/2018 renal sonogram. 12/26/2015 CT angiogram chest, abdomen and pelvis.  FINDINGS: Lower chest: Minimal atelectatic changes lung bases. Heart size within normal limits.  Hepatobiliary: No worrisome hepatic lesion. No calcified gallstones.  Pancreas: No worrisome pancreatic mass or inflammation.  Spleen: No splenic mass or enlargement.  Adrenals/Urinary Tract: No hydronephrosis or obstructing  stone. Several left renal low-density structures, larger ones are cysts whereas others are too small to adequately characterize. No adrenal lesion. Noncontrast filled urinary bladder without abnormality.  Stomach/Bowel: Diffuse inflammation of sigmoid colon with perforation. 2 cm gas collection within the sigmoid mesentery with surrounding inflammation but without well-defined drainable abscess. Findings most suggestive of perforated diverticulitis. This segment of bowel needs to be evaluated after treatment to exclude underlying lesion.  No other bowel inflammatory process is noted.  Vascular/Lymphatic: No abdominal aortic aneurysm or large vessel occlusion. Scattered normal size lymph nodes some of which may be reactive in origin.  Reproductive: Coarse calcifications within prostate gland.  Other: No bowel containing hernia.  Musculoskeletal: No worrisome osseous abnormality.  IMPRESSION: 1. Sigmoid diverticulitis with perforation as detailed above. Currently no well-defined drainable abscess. 2. Several left renal low-density structures, larger ones are cysts whereas others are too small to adequately characterize.  These results were called by telephone at the time of interpretation on  04/11/2018 at 4:37 pm to Dr. Wells Guiles LORD , who verbally acknowledged these results.   Electronically Signed   By: Genia Del M.D.   On: 04/11/2018 16:47 Assessment:      Sigmoid diverticulitis without abscess formation Leukocytosis Increased creatinine Increased bilirubin levels  Plan:     Patient currently has minimal symptoms after some Dilaudid injections.  Review of CT scan does not reveal frank perforation requiring any immediate surgical intervention.  Clinical exam reaffirms this as well.  Will admit and continue IV Zosyn and monitor for continued pain improvement.    Leukocytosis-serial lab exams to trend white count as well.  N.p.o. for tonight and then  advance diet as symptoms continue to improve.  We went over the pathophysiology of diverticulitis and all questions were answered to the patient's satisfaction.  Increased creatinine level- likely from decreased p.o. intake.  Will monitor with serial labs.  Increased bilirubin levels- no right upper quadrant pain to indicate possible gallbladder etiology.  We will fractionate tomorrow morning and continue to monitor at this point  We also did discuss the remote chance of a underlying malignancy presenting as such, therefore a need for a colonoscopy in the future as well.

## 2018-04-11 NOTE — ED Triage Notes (Signed)
Pt is here from PCP office where he was seen for severe LLQ abdominal pain which began Saturday.  Pt states that it is sharp and intermittent.  Pt reports some burning with urination this am.  Pt has had intermittent low grade temp with this.  100.7 F at the PCP office.  PCP wanted him to be sent here for r/o diverticulitis (possibly kidney stone).  Pt arrives in obviously severe pain.

## 2018-04-12 ENCOUNTER — Other Ambulatory Visit: Payer: Self-pay

## 2018-04-12 ENCOUNTER — Ambulatory Visit: Payer: PPO | Admitting: Family Medicine

## 2018-04-12 LAB — HEPATIC FUNCTION PANEL
ALT: 19 U/L (ref 0–44)
AST: 23 U/L (ref 15–41)
Albumin: 3.2 g/dL — ABNORMAL LOW (ref 3.5–5.0)
Alkaline Phosphatase: 51 U/L (ref 38–126)
Bilirubin, Direct: 0.4 mg/dL — ABNORMAL HIGH (ref 0.0–0.2)
Indirect Bilirubin: 1.6 mg/dL — ABNORMAL HIGH (ref 0.3–0.9)
Total Bilirubin: 2 mg/dL — ABNORMAL HIGH (ref 0.3–1.2)
Total Protein: 6.4 g/dL — ABNORMAL LOW (ref 6.5–8.1)

## 2018-04-12 LAB — BASIC METABOLIC PANEL
Anion gap: 11 (ref 5–15)
BUN: 23 mg/dL — ABNORMAL HIGH (ref 6–20)
CO2: 24 mmol/L (ref 22–32)
Calcium: 8.2 mg/dL — ABNORMAL LOW (ref 8.9–10.3)
Chloride: 102 mmol/L (ref 98–111)
Creatinine, Ser: 1.73 mg/dL — ABNORMAL HIGH (ref 0.61–1.24)
GFR calc Af Amer: 50 mL/min — ABNORMAL LOW (ref >60)
GFR calc non Af Amer: 43 mL/min — ABNORMAL LOW (ref >60)
Glucose, Bld: 71 mg/dL (ref 70–99)
Potassium: 4.4 mmol/L (ref 3.5–5.1)
Sodium: 137 mmol/L (ref 135–145)

## 2018-04-12 LAB — CBC
HCT: 38.5 % — ABNORMAL LOW (ref 39.0–52.0)
Hemoglobin: 13.2 g/dL (ref 13.0–17.0)
MCH: 31.3 pg (ref 26.0–34.0)
MCHC: 34.3 g/dL (ref 30.0–36.0)
MCV: 91.2 fL (ref 80.0–100.0)
Platelets: 149 10*3/uL — ABNORMAL LOW (ref 150–400)
RBC: 4.22 MIL/uL (ref 4.22–5.81)
RDW: 11.9 % (ref 11.5–15.5)
WBC: 9 10*3/uL (ref 4.0–10.5)
nRBC: 0 % (ref 0.0–0.2)

## 2018-04-12 LAB — GLUCOSE, CAPILLARY: Glucose-Capillary: 80 mg/dL (ref 70–99)

## 2018-04-12 LAB — PHOSPHORUS: Phosphorus: 3.7 mg/dL (ref 2.5–4.6)

## 2018-04-12 LAB — MAGNESIUM: Magnesium: 1.8 mg/dL (ref 1.7–2.4)

## 2018-04-12 MED ORDER — KETOROLAC TROMETHAMINE 30 MG/ML IJ SOLN
30.0000 mg | Freq: Four times a day (QID) | INTRAMUSCULAR | Status: DC | PRN
  Administered 2018-04-12 – 2018-04-13 (×4): 30 mg via INTRAVENOUS
  Filled 2018-04-12 (×4): qty 1

## 2018-04-12 MED ORDER — LACTATED RINGERS IV BOLUS
1000.0000 mL | Freq: Once | INTRAVENOUS | Status: AC
  Administered 2018-04-12: 1000 mL via INTRAVENOUS

## 2018-04-12 MED ORDER — PIPERACILLIN-TAZOBACTAM 3.375 G IVPB
3.3750 g | Freq: Three times a day (TID) | INTRAVENOUS | Status: DC
  Administered 2018-04-12 – 2018-04-13 (×3): 3.375 g via INTRAVENOUS
  Filled 2018-04-12 (×4): qty 50

## 2018-04-12 NOTE — Consult Note (Signed)
Pharmacy Antibiotic Note  Randy Dean is a 52 y.o. male admitted on 04/11/2018 with diverticulitis.  Pharmacy has been consulted for Zosyn dosing.  Plan: Zosyn 3.375g IV q8h (4 hour infusion).  Weight: 220 lb (99.8 kg)  Temp (24hrs), Avg:98.9 F (37.2 C), Min:97.8 F (36.6 C), Max:101.7 F (38.7 C)  Recent Labs  Lab 04/11/18 1208 04/12/18 0454  WBC 13.2* 9.0  CREATININE 1.74* 1.73*    CrCl cannot be calculated (Unknown ideal weight.).    Allergies  Allergen Reactions  . Serzone [Nefazodone]     Tongue and facial swelling  . Codeine Nausea Only    Dizziness, sweating  . Effexor [Venlafaxine]     "Feels out of it"  . Paxil [Paroxetine Hcl]     "does not feel right in the head"  . Esomeprazole Sodium Rash    Antimicrobials this admission: 1014 Zosyn >>    Thank you for allowing pharmacy to be a part of this patient's care.  Lu Duffel, PharmD Clinical Pharmacist 04/12/2018 10:09 AM

## 2018-04-12 NOTE — Progress Notes (Signed)
Subjective:  CC:  Randy Dean is a 52 y.o. male  Hospital stay day 1,   perforated sigmoid diverticulitis  HPI: Pain much improved but still present.  Asking for food  ROS:  A 5 point review of systems was performed and pertinent positives and negatives noted in HPI.   Objective:      Temp:  [97.8 F (36.6 C)-101.7 F (38.7 C)] 98.4 F (36.9 C) (10/15 0904) Pulse Rate:  [64-102] 64 (10/15 0904) Resp:  [7-26] 18 (10/15 0904) BP: (110-137)/(61-86) 117/70 (10/15 0904) SpO2:  [90 %-100 %] 95 % (10/15 0904) Weight:  [99.8 kg] 99.8 kg (10/14 1204)       Weight: 99.8 kg     Intake/Output this shift:  No intake/output data recorded.       Constitutional :  alert, cooperative, appears stated age and no distress  Respiratory:  clear to auscultation bilaterally  Cardiovascular:  regular rate and rhythm  Gastrointestinal: soft, no guarding, but still has focal tenderness in LLQ, no major change from previous exam..   Skin: Cool and moist.   Psychiatric: Normal affect, non-agitated, not confused       LABS:  CMP Latest Ref Rng & Units 04/12/2018 04/11/2018 10/21/2016  Glucose 70 - 99 mg/dL 71 84 98  BUN 6 - 20 mg/dL 23(H) 18 14  Creatinine 0.61 - 1.24 mg/dL 1.73(H) 1.74(H) 1.67(H)  Sodium 135 - 145 mmol/L 137 136 142  Potassium 3.5 - 5.1 mmol/L 4.4 4.6 4.6  Chloride 98 - 111 mmol/L 102 100 103  CO2 22 - 32 mmol/L 24 24 28   Calcium 8.9 - 10.3 mg/dL 8.2(L) 8.7(L) 9.1  Total Protein 6.5 - 8.1 g/dL 6.4(L) 7.4 6.5  Total Bilirubin 0.3 - 1.2 mg/dL 2.0(H) 2.1(H) 0.4  Alkaline Phos 38 - 126 U/L 51 62 71  AST 15 - 41 U/L 23 29 18   ALT 0 - 44 U/L 19 24 17    CBC Latest Ref Rng & Units 04/12/2018 04/11/2018 10/21/2016  WBC 4.0 - 10.5 K/uL 9.0 13.2(H) 5.4  Hemoglobin 13.0 - 17.0 g/dL 13.2 14.8 15.0  Hematocrit 39.0 - 52.0 % 38.5(L) 42.4 42.7  Platelets 150 - 400 K/uL 149(L) 182 172    RADS: n/a Assessment:   Sigmoid diverticulitis without abscess formation -continue NPO  due to persistant pain.  Will reassess again later in the day.  Leukocytosis- resolved.  Continue abx for above  Increased creatinine- still elevated.  Will fluid bolus and continue to monitor  Increased bilirubin levels- indirect bilirubinemia.  Possible increased turnover due to above.  Will continue with daily labs.  Lovenox, pepcid for prophy

## 2018-04-13 ENCOUNTER — Telehealth: Payer: Self-pay | Admitting: Family Medicine

## 2018-04-13 LAB — CBC
HCT: 38.9 % — ABNORMAL LOW (ref 39.0–52.0)
Hemoglobin: 13.3 g/dL (ref 13.0–17.0)
MCH: 30.7 pg (ref 26.0–34.0)
MCHC: 34.2 g/dL (ref 30.0–36.0)
MCV: 89.8 fL (ref 80.0–100.0)
Platelets: 173 10*3/uL (ref 150–400)
RBC: 4.33 MIL/uL (ref 4.22–5.81)
RDW: 11.9 % (ref 11.5–15.5)
WBC: 5.7 10*3/uL (ref 4.0–10.5)
nRBC: 0 % (ref 0.0–0.2)

## 2018-04-13 LAB — HEPATIC FUNCTION PANEL
ALT: 20 U/L (ref 0–44)
AST: 30 U/L (ref 15–41)
Albumin: 3.5 g/dL (ref 3.5–5.0)
Alkaline Phosphatase: 51 U/L (ref 38–126)
Bilirubin, Direct: 0.4 mg/dL — ABNORMAL HIGH (ref 0.0–0.2)
Indirect Bilirubin: 1 mg/dL — ABNORMAL HIGH (ref 0.3–0.9)
Total Bilirubin: 1.4 mg/dL — ABNORMAL HIGH (ref 0.3–1.2)
Total Protein: 6.7 g/dL (ref 6.5–8.1)

## 2018-04-13 LAB — HIV ANTIBODY (ROUTINE TESTING W REFLEX): HIV Screen 4th Generation wRfx: NONREACTIVE

## 2018-04-13 LAB — MAGNESIUM: Magnesium: 2.2 mg/dL (ref 1.7–2.4)

## 2018-04-13 LAB — BASIC METABOLIC PANEL
Anion gap: 10 (ref 5–15)
BUN: 22 mg/dL — ABNORMAL HIGH (ref 6–20)
CO2: 24 mmol/L (ref 22–32)
Calcium: 8.5 mg/dL — ABNORMAL LOW (ref 8.9–10.3)
Chloride: 102 mmol/L (ref 98–111)
Creatinine, Ser: 1.77 mg/dL — ABNORMAL HIGH (ref 0.61–1.24)
GFR calc Af Amer: 48 mL/min — ABNORMAL LOW (ref >60)
GFR calc non Af Amer: 42 mL/min — ABNORMAL LOW (ref >60)
Glucose, Bld: 70 mg/dL (ref 70–99)
Potassium: 3.9 mmol/L (ref 3.5–5.1)
Sodium: 136 mmol/L (ref 135–145)

## 2018-04-13 LAB — PHOSPHORUS: Phosphorus: 3.4 mg/dL (ref 2.5–4.6)

## 2018-04-13 LAB — GLUCOSE, CAPILLARY: Glucose-Capillary: 97 mg/dL (ref 70–99)

## 2018-04-13 MED ORDER — FAMOTIDINE 20 MG PO TABS
20.0000 mg | ORAL_TABLET | Freq: Two times a day (BID) | ORAL | Status: DC
  Administered 2018-04-13: 20 mg via ORAL
  Filled 2018-04-13: qty 1

## 2018-04-13 MED ORDER — NYSTATIN 100000 UNIT/ML MT SUSP
5.0000 mL | Freq: Four times a day (QID) | OROMUCOSAL | Status: DC
  Administered 2018-04-13 (×3): 500000 [IU] via ORAL
  Filled 2018-04-13 (×3): qty 5

## 2018-04-13 MED ORDER — OXYCODONE HCL 5 MG PO TABS
5.0000 mg | ORAL_TABLET | Freq: Four times a day (QID) | ORAL | Status: DC | PRN
  Administered 2018-04-13 (×2): 5 mg via ORAL
  Filled 2018-04-13 (×2): qty 1

## 2018-04-13 MED ORDER — CIPROFLOXACIN HCL 500 MG PO TABS
500.0000 mg | ORAL_TABLET | Freq: Two times a day (BID) | ORAL | Status: DC
  Administered 2018-04-13 – 2018-04-14 (×2): 500 mg via ORAL
  Filled 2018-04-13 (×2): qty 1

## 2018-04-13 MED ORDER — FLUTICASONE PROPIONATE 50 MCG/ACT NA SUSP
2.0000 | Freq: Two times a day (BID) | NASAL | Status: DC
  Administered 2018-04-13: 2 via NASAL
  Filled 2018-04-13: qty 16

## 2018-04-13 MED ORDER — LORATADINE 10 MG PO TABS
10.0000 mg | ORAL_TABLET | Freq: Every day | ORAL | Status: DC
  Administered 2018-04-13: 10 mg via ORAL
  Filled 2018-04-13: qty 1

## 2018-04-13 MED ORDER — FLUTICASONE PROPIONATE 50 MCG/ACT NA SUSP
1.0000 | Freq: Every day | NASAL | Status: DC
  Administered 2018-04-13: 1 via NASAL
  Filled 2018-04-13: qty 16

## 2018-04-13 MED ORDER — METRONIDAZOLE 500 MG PO TABS
500.0000 mg | ORAL_TABLET | Freq: Three times a day (TID) | ORAL | Status: DC
  Administered 2018-04-13 – 2018-04-14 (×3): 500 mg via ORAL
  Filled 2018-04-13 (×5): qty 1

## 2018-04-13 NOTE — Telephone Encounter (Signed)
Please review. Thanks!  

## 2018-04-13 NOTE — Telephone Encounter (Signed)
Pt stated that he is in the hospital and the doctor there advised pt of his levels. Pt stated that he has discussed this with Dr. Rosanna Randy in the past and is requesting Dr. Rosanna Randy look at his labs and return his call. Pt is wanting Dr. Rosanna Randy to advise pt who he should see for this. Pt was advised that we can't do anything for pt in the hospital. Pt stated that he would just like to speak with Dr. Rosanna Randy. Please advise. Thanks TNP

## 2018-04-13 NOTE — Progress Notes (Signed)
PHARMACIST - PHYSICIAN COMMUNICATION  DR:   Lysle Pearl  CONCERNING: IV to Oral Route Change Policy  RECOMMENDATION: This patient is receiving famotidien by the intravenous route.  Based on criteria approved by the Pharmacy and Therapeutics Committee, the intravenous medication(s) is/are being converted to the equivalent oral dose form(s).   DESCRIPTION: These criteria include:  The patient is eating (either orally or via tube) and/or has been taking other orally administered medications for a least 24 hours  The patient has no evidence of active gastrointestinal bleeding or impaired GI absorption (gastrectomy, short bowel, patient on TNA or NPO).  If you have questions about this conversion, please contact the Pharmacy Department  []   [phone] )  [facility] [x]   [phone] )  [facility] []   [phone] )  [facility] []   [phone] )  [facility] []   [phone] )  [place], [place], PharmD 04/13/2018 2:47 PM

## 2018-04-13 NOTE — Plan of Care (Signed)
Nutrition Education Note  RD consulted for nutrition education regarding nutrition management for diverticulosis/diverticulitis.  Spoke extensively with pt, wife, and father at bedside. Pt and family very eager to receive diet education. Pt's wife with some knowledge of low-fiber and high-fiber diets prior to RD education.  Pt shares that he is a "meat and potatoes kind of guy." Pt states that he does not like many green vegetables due to taste preference.  Towards the end of diet education, pt became dizzy and lightheaded. Informed RN who planned to go check pt's blood pressure.   RD provided both "Low Fiber Nutrition Therapy" and "High Fiber Nutrition Therapy" handout from the Academy of [employer]. Reviewed home diet with pt and suggested ways to meet nutrition goals over the next several weeks. Explained reasons to follow Low Fiber diet for the next 4-6 weeks and discussed ways to achieve. Briefly explained pathophysiology of diverticulosis/diverticulitis.  Discussed best practice for long-term management of diverticulosis is a High Fiber diet and discussed ways to increase fiber content in an appropriate time frame. Encouraged fresh fruits and vegetables as well as whole grain sources of carbohydrates to maximize fiber intake. Encouraged fluid intake. Discussed symptom management should abdominal pain occur while on high fiber diet.  Teach back method used. Pt verbalizes understanding of information provided. Pt and wife very appreciative of education and handouts.  Expect good compliance.  Body mass index is Body mass index is 29.84 kg/m.Marland Kitchen Pt meets criteria for overweight based on current BMI.  Current diet order is Clear Liquids, patient is consuming approximately 100% of meals at this time. Labs and medications reviewed. No further nutrition interventions warranted at this time. RD contact information provided. If additional nutrition issues arise, please re-consult  RD.   Gaynell Face, MS, RD, LDN Inpatient Clinical Dietitian Pager: [phone] Weekend/After Hours: [phone]

## 2018-04-13 NOTE — Progress Notes (Signed)
Subjective:  CC:  Randy Dean is a 52 y.o. male  Hospital stay day 2,   perforated sigmoid diverticulitis  HPI: Pain much improved. Asking for food again.  ROS:  A 5 point review of systems was performed and pertinent positives and negatives noted in HPI.   Objective:      Temp:  [97.6 F (36.4 C)-98.4 F (36.9 C)] 97.6 F (36.4 C) (10/16 0542) Pulse Rate:  [54-63] 57 (10/16 0542) Resp:  [16-18] 16 (10/16 0542) BP: (124-135)/(72-77) 135/72 (10/16 0542) SpO2:  [97 %-99 %] 98 % (10/16 0542)       Weight: 99.8 kg     Intake/Output this shift:  Total I/O In: 274.4 [I.V.:224.4; IV Piggyback:50] Out: -        Constitutional :  alert, cooperative, appears stated age and no distress  Respiratory:  clear to auscultation bilaterally  Cardiovascular:  regular rate and rhythm  Gastrointestinal: soft, no guarding, with no more focal tenderness in LLQ.   Skin: Cool and moist.   Psychiatric: Normal affect, non-agitated, not confused       LABS:  CMP Latest Ref Rng & Units 04/13/2018 04/12/2018 04/11/2018  Glucose 70 - 99 mg/dL 70 71 84  BUN 6 - 20 mg/dL 22(H) 23(H) 18  Creatinine 0.61 - 1.24 mg/dL 1.77(H) 1.73(H) 1.74(H)  Sodium 135 - 145 mmol/L 136 137 136  Potassium 3.5 - 5.1 mmol/L 3.9 4.4 4.6  Chloride 98 - 111 mmol/L 102 102 100  CO2 22 - 32 mmol/L 24 24 24   Calcium 8.9 - 10.3 mg/dL 8.5(L) 8.2(L) 8.7(L)  Total Protein 6.5 - 8.1 g/dL 6.7 6.4(L) 7.4  Total Bilirubin 0.3 - 1.2 mg/dL 1.4(H) 2.0(H) 2.1(H)  Alkaline Phos 38 - 126 U/L 51 51 62  AST 15 - 41 U/L 30 23 29   ALT 0 - 44 U/L 20 19 24    CBC Latest Ref Rng & Units 04/13/2018 04/12/2018 04/11/2018  WBC 4.0 - 10.5 K/uL 5.7 9.0 13.2(H)  Hemoglobin 13.0 - 17.0 g/dL 13.3 13.2 14.8  Hematocrit 39.0 - 52.0 % 38.9(L) 38.5(L) 42.4  Platelets 150 - 400 K/uL 173 149(L) 182    RADS: n/a Assessment:   Sigmoid diverticulitis without abscess formation -no pain today.  Will start clears and advance diet slowly as  tolerated.  Leukocytosis- resolved.  Switch to PO abx once tolerating diet.  Increased creatinine- still elevated.  Pt now mentions he was told before he has decreased kidney functions.  Will continue to monitor but wont attempt active lowering of creatinine level at this  Time.  Increased bilirubin levels- indirect bilirubinemia.  Possible increased turnover due to above.  Will continue with daily labs. Decreasing, no active intervention needed.  dietecian order placed per patient request.  Lovenox, pepcid for prophy

## 2018-04-13 NOTE — Plan of Care (Signed)

## 2018-04-13 NOTE — Progress Notes (Signed)
Patient would like to restart his scheduled medications and also prefers a p.o option for pain/headache since toradol has been the only effective medication and does not want the dilaudid. Orders placed. MD also gave verbal order to begin clear liquid diet and we will see how patient as able to tolerate.

## 2018-04-14 LAB — BASIC METABOLIC PANEL
Anion gap: 7 (ref 5–15)
BUN: 26 mg/dL — ABNORMAL HIGH (ref 6–20)
CO2: 27 mmol/L (ref 22–32)
Calcium: 8.3 mg/dL — ABNORMAL LOW (ref 8.9–10.3)
Chloride: 103 mmol/L (ref 98–111)
Creatinine, Ser: 1.77 mg/dL — ABNORMAL HIGH (ref 0.61–1.24)
GFR calc Af Amer: 48 mL/min — ABNORMAL LOW (ref >60)
GFR calc non Af Amer: 42 mL/min — ABNORMAL LOW (ref >60)
Glucose, Bld: 88 mg/dL (ref 70–99)
Potassium: 4.3 mmol/L (ref 3.5–5.1)
Sodium: 137 mmol/L (ref 135–145)

## 2018-04-14 LAB — CBC
HCT: 36.2 % — ABNORMAL LOW (ref 39.0–52.0)
Hemoglobin: 12.4 g/dL — ABNORMAL LOW (ref 13.0–17.0)
MCH: 31.2 pg (ref 26.0–34.0)
MCHC: 34.3 g/dL (ref 30.0–36.0)
MCV: 91 fL (ref 80.0–100.0)
Platelets: 142 10*3/uL — ABNORMAL LOW (ref 150–400)
RBC: 3.98 MIL/uL — ABNORMAL LOW (ref 4.22–5.81)
RDW: 11.8 % (ref 11.5–15.5)
WBC: 4.5 10*3/uL (ref 4.0–10.5)
nRBC: 0 % (ref 0.0–0.2)

## 2018-04-14 LAB — PHOSPHORUS: Phosphorus: 4 mg/dL (ref 2.5–4.6)

## 2018-04-14 LAB — HEPATIC FUNCTION PANEL
ALT: 20 U/L (ref 0–44)
AST: 25 U/L (ref 15–41)
Albumin: 3 g/dL — ABNORMAL LOW (ref 3.5–5.0)
Alkaline Phosphatase: 44 U/L (ref 38–126)
Bilirubin, Direct: 0.2 mg/dL (ref 0.0–0.2)
Indirect Bilirubin: 0.4 mg/dL (ref 0.3–0.9)
Total Bilirubin: 0.6 mg/dL (ref 0.3–1.2)
Total Protein: 6.1 g/dL — ABNORMAL LOW (ref 6.5–8.1)

## 2018-04-14 LAB — MAGNESIUM: Magnesium: 2.4 mg/dL (ref 1.7–2.4)

## 2018-04-14 MED ORDER — METRONIDAZOLE 500 MG PO TABS: 500.0000 mg | ORAL_TABLET | Freq: Three times a day (TID) | ORAL | 0 refills | Status: AC

## 2018-04-14 MED ORDER — CIPROFLOXACIN HCL 500 MG PO TABS: 500.0000 mg | ORAL_TABLET | Freq: Two times a day (BID) | ORAL | 0 refills | Status: AC

## 2018-04-14 MED ORDER — OXYCODONE HCL 5 MG PO TABS: 5.0000 mg | ORAL_TABLET | Freq: Four times a day (QID) | ORAL | 0 refills | Status: DC | PRN

## 2018-04-14 MED ORDER — DOCUSATE SODIUM 100 MG PO CAPS: 100.0000 mg | ORAL_CAPSULE | Freq: Two times a day (BID) | ORAL | 0 refills | Status: AC | PRN

## 2018-04-14 MED ORDER — IBUPROFEN 800 MG PO TABS: 800.0000 mg | ORAL_TABLET | Freq: Three times a day (TID) | ORAL | 0 refills | Status: DC | PRN

## 2018-04-14 NOTE — Progress Notes (Signed)
04/14/2018 10:35  Randy Dean to be D/C'd Home per MD order.  Discussed prescriptions and follow up appointments with the patient. Prescriptions given to patient, medication list explained in detail. Pt verbalized understanding.  Allergies as of 04/14/2018      Reactions   Serzone [nefazodone]    Tongue and facial swelling   Codeine Nausea Only   Dizziness, sweating   Effexor [venlafaxine]    "Feels out of it"   Paxil [paroxetine Hcl]    "does not feel right in the head"   Esomeprazole Sodium Rash      Medication List    STOP taking these medications   doxycycline 100 MG tablet Commonly known as:  VIBRA-TABS   meloxicam 15 MG tablet Commonly known as:  MOBIC   tiZANidine 4 MG tablet Commonly known as:  ZANAFLEX     TAKE these medications   acetaminophen 500 MG tablet Commonly known as:  TYLENOL Take 500 mg by mouth every 6 (six) hours as needed for mild pain.   ALPRAZolam 0.5 MG tablet Commonly known as:  XANAX Take 2 tablets approximately 45 minutes prior to the MRI study, take a third tablet if needed.   ciprofloxacin 500 MG tablet Commonly known as:  CIPRO Take 1 tablet (500 mg total) by mouth 2 (two) times daily for 7 days.   dexlansoprazole 60 MG capsule Commonly known as:  DEXILANT Take 1 capsule (60 mg total) by mouth daily.   diazepam 5 MG tablet Commonly known as:  VALIUM Take 1 tablet (5 mg total) by mouth See admin instructions. 1 PO 1 hour prior to procedure, may repeat if needed   docusate sodium 100 MG capsule Commonly known as:  COLACE Take 1 capsule (100 mg total) by mouth 2 (two) times daily as needed for up to 10 days for mild constipation.   fluticasone 50 MCG/ACT nasal spray Commonly known as:  FLONASE Place 2 sprays into both nostrils 2 (two) times daily.   ibuprofen 800 MG tablet Commonly known as:  ADVIL,MOTRIN Take 1 tablet (800 mg total) by mouth every 8 (eight) hours as needed for mild pain or moderate pain.   loratadine  10 MG tablet Commonly known as:  CLARITIN Take 10 mg by mouth daily.   metroNIDAZOLE 500 MG tablet Commonly known as:  FLAGYL Take 1 tablet (500 mg total) by mouth every 8 (eight) hours for 7 days.   nystatin 100000 UNIT/ML suspension Commonly known as:  MYCOSTATIN Take 5 mLs (500,000 Units total) by mouth 4 (four) times daily.   oxyCODONE 5 MG immediate release tablet Commonly known as:  Oxy IR/ROXICODONE Take 1 tablet (5 mg total) by mouth every 6 (six) hours as needed for severe pain.   sertraline 50 MG tablet Commonly known as:  ZOLOFT Take 1 tablet (50 mg total) by mouth daily.       Vitals:   04/14/18 0039 04/14/18 0539  BP: (!) 97/54 135/76  Pulse: (!) 56 (!) 54  Resp: (!) 22 18  Temp: 97.7 F (36.5 C) 97.8 F (36.6 C)  SpO2: 96% 99%    Skin clean, dry and intact without evidence of skin break down, no evidence of skin tears noted. IV catheter discontinued intact. Site without signs and symptoms of complications. Dressing and pressure applied. Pt denies pain at this time. No complaints noted.  An After Visit Summary was printed and given to the patient. Patient escorted via [place], and D/C home via private auto.  Delbert Harness  E

## 2018-04-14 NOTE — Discharge Summary (Signed)
Physician Discharge Summary  Patient ID: CHADD TOLLISON MRN: [id number] DOB/AGE: 1965-10-29 52 y.o.  Admit date: 04/11/2018 Discharge date: 04/14/2018  Admission Diagnoses: sigmoid diverticulitis with perforation  Discharge Diagnoses:  Same as above, CKD stage III  Discharged Condition: good  Hospital Course: admitted for above.  IV abx started and pain controlled.  Switched to oral abx and advanced diet.  At time of discharge, normal WBC, no pain, voiding and tolerating diet, so deemed appropriate for d/c and f/u as outpt for colonoscopy and also f/u with PCP regarding increased Cr levels.  Consults: None  Discharge Exam: Blood pressure 135/76, pulse (!) 54, temperature 97.8 F (36.6 C), temperature source Oral, resp. rate 18, height 6' (1.829 m), weight 99.8 kg, SpO2 99 %. General appearance: alert, cooperative and no distress Chest wall: no tenderness Cardio: regular rate and rhythm GI: soft, non-tender; bowel sounds normal; no masses,  no organomegaly  Disposition:  Discharge disposition: 01-Home or Self Care       Discharge Instructions    Discharge patient   Complete by:  As directed    Discharge disposition:  01-Home or Self Care   Discharge patient date:  04/14/2018     Allergies as of 04/14/2018      Reactions   Serzone [nefazodone]    Tongue and facial swelling   Codeine Nausea Only   Dizziness, sweating   Effexor [venlafaxine]    "Feels out of it"   Paxil [paroxetine Hcl]    "does not feel right in the head"   Esomeprazole Sodium Rash      Medication List    STOP taking these medications   doxycycline 100 MG tablet Commonly known as:  VIBRA-TABS   meloxicam 15 MG tablet Commonly known as:  MOBIC   tiZANidine 4 MG tablet Commonly known as:  ZANAFLEX     TAKE these medications   acetaminophen 500 MG tablet Commonly known as:  TYLENOL Take 500 mg by mouth every 6 (six) hours as needed for mild pain.   ALPRAZolam 0.5 MG tablet Commonly  known as:  XANAX Take 2 tablets approximately 45 minutes prior to the MRI study, take a third tablet if needed.   ciprofloxacin 500 MG tablet Commonly known as:  CIPRO Take 1 tablet (500 mg total) by mouth 2 (two) times daily for 7 days.   dexlansoprazole 60 MG capsule Commonly known as:  DEXILANT Take 1 capsule (60 mg total) by mouth daily.   diazepam 5 MG tablet Commonly known as:  VALIUM Take 1 tablet (5 mg total) by mouth See admin instructions. 1 PO 1 hour prior to procedure, may repeat if needed   docusate sodium 100 MG capsule Commonly known as:  COLACE Take 1 capsule (100 mg total) by mouth 2 (two) times daily as needed for up to 10 days for mild constipation.   fluticasone 50 MCG/ACT nasal spray Commonly known as:  FLONASE Place 2 sprays into both nostrils 2 (two) times daily.   ibuprofen 800 MG tablet Commonly known as:  ADVIL,MOTRIN Take 1 tablet (800 mg total) by mouth every 8 (eight) hours as needed for mild pain or moderate pain.   loratadine 10 MG tablet Commonly known as:  CLARITIN Take 10 mg by mouth daily.   metroNIDAZOLE 500 MG tablet Commonly known as:  FLAGYL Take 1 tablet (500 mg total) by mouth every 8 (eight) hours for 7 days.   nystatin 100000 UNIT/ML suspension Commonly known as:  MYCOSTATIN Take 5 mLs (500,000  Units total) by mouth 4 (four) times daily.   oxyCODONE 5 MG immediate release tablet Commonly known as:  Oxy IR/ROXICODONE Take 1 tablet (5 mg total) by mouth every 6 (six) hours as needed for severe pain.   sertraline 50 MG tablet Commonly known as:  ZOLOFT Take 1 tablet (50 mg total) by mouth daily.      Follow-up Information    [place], Teal Bontrager, DO. Go on 04/27/2018.   Specialty:  Surgery Why:  Wednesday October 30th at 8:30am to discuss colonoscopy Contact information: [place]  [id number] [phone]            Total time spent arranging discharge was >65min. Signed: Benjamine Sprague 04/14/2018, 11:06  AM

## 2018-04-14 NOTE — Discharge Instructions (Signed)
Diverticulitis Diverticulitis is when small pockets in your large intestine (colon) get infected or swollen. This causes stomach pain and watery poop (diarrhea). These pouches are called diverticula. They form in people who have a condition called diverticulosis. Follow these instructions at home: Medicines  Take over-the-counter and prescription medicines only as told by your doctor. These include: ? Antibiotics. ? Pain medicines. ? Fiber pills. ? Probiotics. ? Stool softeners.  Do not drive or use heavy machinery while taking prescription pain medicine.  If you were prescribed an antibiotic, take it as told. Do not stop taking it even if you feel better.  tylenol and advil as needed for discomfort.  Please alternate between the two every four hours as needed for pain.    Use narcotics, if prescribed, only when tylenol and motrin is not enough to control pain.  325-650mg  every 8hrs to max of 4000mg /24hrs for the tylenol.    Advil up to 800mg  per dose every 8hrs as needed for pain.    General instructions  Follow a diet as told by your doctor.  When you feel better, your doctor may tell you to change your diet. You may need to eat a lot of fiber. Fiber makes it easier to poop (have bowel movements). Healthy foods with fiber include: ? Berries. ? Beans. ? Lentils. ? Green vegetables.  Exercise 3 or more times a week. Aim for 30 minutes each time. Exercise enough to sweat and make your heart beat faster.  Keep all follow-up visits as told. This is important. You may need to have an exam of the large intestine. This is called a colonoscopy. Contact a doctor if:  Your pain does not get better.  You have a hard time eating or drinking.  You are not pooping like normal. Get help right away if:  Your pain gets worse.  Your problems do not get better.  Your problems get worse very fast.  You have a fever.  You throw up (vomit) more than one time.  You have poop that  is: ? Bloody. ? Black. ? Tarry. Summary  Diverticulitis is when small pockets in your large intestine (colon) get infected or swollen.  Take medicines only as told by your doctor.  Follow a diet as told by your doctor. This information is not intended to replace advice given to you by your health care provider. Make sure you discuss any questions you have with your health care provider. Document Released: 12/02/2007 Document Revised: 07/02/2016 Document Reviewed: 07/02/2016 Elsevier Interactive Patient Education  2017 [employer].

## 2018-04-18 ENCOUNTER — Other Ambulatory Visit: Payer: Self-pay | Admitting: *Deleted

## 2018-04-18 NOTE — Patient Outreach (Signed)
[facility]) Care Management  04/18/2018  Randy Dean 08-04-1965 [id number]   EMMI-general discharge   RED ON [place] Day # 1 Date: 04/16/18 1046  Red Alert Reason: questions about discharge papers? Yes Know who to call abut changes in condition? No Other questions/problems? Yes    Outreach attempt # 1 successful at home/mobile number  Patient is able to verify HIPAA [place] Management RN reviewed and addressed red alert with patient  Read alert issues resolved per Randy Dean. He reports he was able to ger questions answered concerning his diet He is now aware he is to be on a low fiber for weeks until seen by MD then advance to a high fiber diet. He confirms he has been given a list of foods on each list by the dietitian that visited with him in the hospital   Otherwise Randy Dean states he is doing well without other medical concerns He voices appreciation of the call from [facility] RN CM     Transition of care services noted to be completed by primary care MD office staff- [facility] practice Randy Dean was made aware that he may receive a call from the office   Social: Randy Dean lives at home and is independent with all care He denies issues with transportation to medical appointments   Conditions: diverticulitis,  Seasonal allergies, acid reflux, traumatic brain injury, anxiety and depression, HLD,  Insomnia, intervertebral cervical disc disorder with myelopathy, tremor, headache. prediabetes  DME cane  Medications: denies concerns with taking medications as prescribed, affording medications, side effects of medications and questions about medications   Appointments: appointment to f/u with surgeon on 04/27/18    Advance Directives: Has a living will and will take to hospital for copies as needed. Not interested in making changes   Consent: [facility] RN CM reviewed [facility] services with patient. Patient gave verbal consent for services. Advised  patient that there will be further automated EMMI- post discharge calls to assess how the patient is doing following the recent hospitalization Advised the patient that another call may be received from a nurse if any of their responses were abnormal. Patient voiced understanding and was appreciative of f/u call.   Plan: [facility] RN CM will close case at this time as patient has been assessed and no needs identified.   RN CM provided patient with [facility] 24hr Nurse Line contact [phone]. And [facility] RN CM   Pt encouraged to return a call to [place] CM prn  [facility] RN CM sent a successful outreach letter as discussed with [facility] brochure enclosed for review    Kimberly L. Lavina Hamman, RN, BSN, [place] Coordinator Office number [phone] Mobile number [phone]  Main [facility] number [phone] Fax number [phone]

## 2018-04-20 DIAGNOSIS — R809 Proteinuria, unspecified: Secondary | ICD-10-CM | POA: Diagnosis not present

## 2018-04-20 DIAGNOSIS — R600 Localized edema: Secondary | ICD-10-CM | POA: Diagnosis not present

## 2018-04-20 DIAGNOSIS — N183 Chronic kidney disease, stage 3 (moderate): Secondary | ICD-10-CM | POA: Diagnosis not present

## 2018-04-27 DIAGNOSIS — K5720 Diverticulitis of large intestine with perforation and abscess without bleeding: Secondary | ICD-10-CM | POA: Diagnosis not present

## 2018-04-28 ENCOUNTER — Ambulatory Visit (INDEPENDENT_AMBULATORY_CARE_PROVIDER_SITE_OTHER): Payer: PPO | Admitting: Family Medicine

## 2018-04-28 ENCOUNTER — Telehealth: Payer: Self-pay | Admitting: Family Medicine

## 2018-04-28 ENCOUNTER — Other Ambulatory Visit: Payer: Self-pay | Admitting: Surgery

## 2018-04-28 VITALS — BP 118/64 | HR 74 | Temp 98.5°F | Resp 16 | Wt 213.0 lb

## 2018-04-28 DIAGNOSIS — S069X0S Unspecified intracranial injury without loss of consciousness, sequela: Secondary | ICD-10-CM

## 2018-04-28 DIAGNOSIS — M5000 Cervical disc disorder with myelopathy, unspecified cervical region: Secondary | ICD-10-CM

## 2018-04-28 DIAGNOSIS — K5720 Diverticulitis of large intestine with perforation and abscess without bleeding: Secondary | ICD-10-CM

## 2018-04-28 DIAGNOSIS — K5740 Diverticulitis of both small and large intestine with perforation and abscess without bleeding: Secondary | ICD-10-CM | POA: Diagnosis not present

## 2018-04-28 DIAGNOSIS — F419 Anxiety disorder, unspecified: Secondary | ICD-10-CM | POA: Diagnosis not present

## 2018-04-28 NOTE — Telephone Encounter (Signed)
FYI

## 2018-04-28 NOTE — Telephone Encounter (Signed)
Letting Dr. Rosanna Randy know his CT Scan is scheduled for Nov 5th at [facility]  Thanks, [employer]

## 2018-04-28 NOTE — Progress Notes (Signed)
Randy Dean  MRN: [id number] DOB: May 30, 1966  Subjective:  HPI   The patient is a 52 year old male who presents for follow up after hospitalization for diverticulitis with perforation.  The patient was admitted on 04/11/18+ and discharged on 04/14/18.  He was placed on IV antibiotic while in the hospital and sent home with oral antibiotics (Metronidazole and Ciprofloxacin) that he has since completed.  The patient states he finished his antibiotics on 04/21/18 and was doing better from the 17-24th.  This week he said he feels like the infection is coming back.  He started 3 days ago with pain.  He went to see GI yesterday and they did more blood work and CT scan.  The patient has not received the results of these tests yet.  There is a form from the [employer] company requesting documentation of his medication reconciliation.  Medication has been discussed, reviewed and reconciled with the patient.     Patient Active Problem List   Diagnosis Date Noted  . Diverticulitis large intestine 04/11/2018  . Prediabetes 10/21/2016  . Abnormality of gait 10/30/2015  . Headache 10/30/2015  . Anxiety and depression 11/23/2014  . Brain injuries ([place]) 11/23/2014  . Clinical depression 11/23/2014  . HLD (hyperlipidemia) 11/23/2014  . Insomnia due to medical condition 11/23/2014  . Injury of face and neck 11/23/2014  . Cervical pain 11/23/2014  . Loss of feeling or sensation 11/23/2014  . Tingling 11/23/2014  . Adiposity 11/23/2014  . Allergic rhinitis, seasonal 11/23/2014  . Neurosis, posttraumatic 11/23/2014  . Spinal stenosis in cervical region 11/23/2014  . Injury brain, traumatic ([place]) 11/23/2014  . Has a tremor 11/23/2014  . Disease of vein 11/23/2014  . Head revolving around 11/23/2014  . Shortness of breath 03/14/2013  . Anxiety 10/11/2008  . Intervertebral cervical disc disorder with myelopathy, cervical region 10/11/2008  . Musculoskeletal disorder and symptoms  referable to neck 10/11/2008  . Acid reflux 10/11/2008    Past Medical History:  Diagnosis Date  . Abnormality of gait 10/30/2015  . Allergic rhinitis   . Anxiety   . Chronic headaches    partially sinus/partially brain injury (per pt)  . GERD (gastroesophageal reflux disease)   . Head trauma 2010   has caused anxiety, memory issues and neck problems  . Headache 10/30/2015   Right temporal   . Neck stiffness    metal plate.  mild limitations of side to side and up and dowm movement  . Prediabetes   . PTSD (post-traumatic stress disorder)   . Stroke [facility])    TIA x3- some residual right side weakness  . Vertigo   . Weakness of right side of body    from TIAs    Social History   Socioeconomic History  . Marital status: Married    Spouse name: Lattie Haw  . Number of children: 0  . Years of education: 58  . Highest education level: Not on file  Occupational History  . Not on file  Social Needs  . Financial resource strain: Not on file  . Food insecurity:    Worry: Not on file    Inability: Not on file  . Transportation needs:    Medical: Not on file    Non-medical: Not on file  Tobacco Use  . Smoking status: Former Smoker    Packs/day: 0.50    Years: 3.00    Pack years: 1.50    Last attempt to quit: 06/28/1984    Years since  quitting: 33.8  . Smokeless tobacco: Current User    Types: Snuff    Last attempt to quit: 06/29/1986  . Tobacco comment: 1 can dip/day  Substance and Sexual Activity  . Alcohol use: No    Alcohol/week: 0.0 standard drinks  . Drug use: No  . Sexual activity: Not on file  Lifestyle  . Physical activity:    Days per week: Not on file    Minutes per session: Not on file  . Stress: Not on file  Relationships  . Social connections:    Talks on phone: Not on file    Gets together: Not on file    Attends religious service: Not on file    Active member of club or organization: Not on file    Attends meetings of clubs or organizations: Not on file      Relationship status: Not on file  . Intimate partner violence:    Fear of current or ex partner: Not on file    Emotionally abused: Not on file    Physically abused: Not on file    Forced sexual activity: Not on file  Other Topics Concern  . Not on file  Social History Narrative   Lives at home with his wife Lattie Haw   Right-handed   Drinks tea or soda about 6 times per day    Outpatient Encounter Medications as of 04/28/2018  Medication Sig  . acetaminophen (TYLENOL) 500 MG tablet Take 500 mg by mouth every 6 (six) hours as needed for mild pain.   Marland Kitchen dexlansoprazole (DEXILANT) 60 MG capsule Take 1 capsule (60 mg total) by mouth daily.  . fluticasone (FLONASE) 50 MCG/ACT nasal spray Place 2 sprays into both nostrils 2 (two) times daily.  Marland Kitchen gabapentin (NEURONTIN) 100 MG capsule Take 300 mg by mouth daily.   Marland Kitchen loratadine (CLARITIN) 10 MG tablet Take 10 mg by mouth daily.  Marland Kitchen nystatin (MYCOSTATIN) 100000 UNIT/ML suspension Take 5 mLs (500,000 Units total) by mouth 4 (four) times daily.  Marland Kitchen oxyCODONE (OXY IR/ROXICODONE) 5 MG immediate release tablet Take 1 tablet (5 mg total) by mouth every 6 (six) hours as needed for severe pain.  Marland Kitchen sertraline (ZOLOFT) 50 MG tablet Take 1 tablet (50 mg total) by mouth daily.  Marland Kitchen ALPRAZolam (XANAX) 0.5 MG tablet Take 2 tablets approximately 45 minutes prior to the MRI study, take a third tablet if needed. (Patient not taking: Reported on 04/11/2018)  . diazepam (VALIUM) 5 MG tablet Take 1 tablet (5 mg total) by mouth See admin instructions. 1 PO 1 hour prior to procedure, may repeat if needed (Patient not taking: Reported on 04/11/2018)  . [DISCONTINUED] ibuprofen (ADVIL,MOTRIN) 800 MG tablet Take 1 tablet (800 mg total) by mouth every 8 (eight) hours as needed for mild pain or moderate pain.   No facility-administered encounter medications on file as of 04/28/2018.     Allergies  Allergen Reactions  . Serzone [Nefazodone]     Tongue and facial swelling  .  Codeine Nausea Only    Dizziness, sweating  . Effexor [Venlafaxine]     "Feels out of it"  . Paxil [Paroxetine Hcl]     "does not feel right in the head"  . Esomeprazole Sodium Rash    Review of Systems  Constitutional: Positive for chills and malaise/fatigue. Negative for fever.  HENT: Negative.   Eyes: Negative.   Cardiovascular: Negative for chest pain.  Gastrointestinal: Positive for abdominal pain, diarrhea (loose) and heartburn. Negative for blood in  stool, constipation, nausea and vomiting.  Genitourinary: Negative.   Skin: Negative.   Endo/Heme/Allergies: Negative.   Psychiatric/Behavioral: Negative.     Objective:  BP 118/64 (BP Location: Right Arm, Patient Position: Sitting, Cuff Size: Normal)   Pulse 74   Temp 98.5 F (36.9 C) (Oral)   Resp 16   Wt 213 lb (96.6 kg)   BMI 28.89 kg/m   Physical Exam  Constitutional: He is oriented to person, place, and time and well-developed, well-nourished, and in no distress.  HENT:  Head: Normocephalic and atraumatic.  Right Ear: External ear normal.  Left Ear: External ear normal.  Nose: Nose normal.  Eyes: No scleral icterus.  Neck: No thyromegaly present.  Cardiovascular: Normal rate, regular rhythm and normal heart sounds.  Pulmonary/Chest: Effort normal and breath sounds normal.  Abdominal: Soft. There is tenderness.  Mild LLQ tenderness but greatly improved from last OV.  Neurological: He is alert and oriented to person, place, and time. Gait normal. GCS score is 15.  Skin: Skin is warm and dry.  Psychiatric: Memory, affect and judgment normal.    Assessment and Plan :  1. Diverticulitis of both large and small intestine with perforation without bleeding Patient markedly improved but still having some pain and tenderness.  Follow-up with surgery already tomorrow and CT scan in a few days.  2. Intervertebral cervical disc disorder with myelopathy, cervical region   3. Traumatic brain injury, without loss of  consciousness, sequela ([place])   4. Anxiety Presently stable  ,I have done the exam and reviewed the chart and it is accurate to the best of my knowledge. Development worker, community has been used and  any errors in dictation or transcription are unintentional. Miguel Aschoff M.D. [facility]

## 2018-05-03 ENCOUNTER — Ambulatory Visit
Admission: RE | Admit: 2018-05-03 | Discharge: 2018-05-03 | Disposition: A | Discharge status: Home / Self Care | Payer: PPO | Source: Ambulatory Visit | Attending: Surgery | Admitting: Surgery

## 2018-05-03 DIAGNOSIS — K5720 Diverticulitis of large intestine with perforation and abscess without bleeding: Secondary | ICD-10-CM | POA: Insufficient documentation

## 2018-05-03 DIAGNOSIS — K5900 Constipation, unspecified: Secondary | ICD-10-CM | POA: Diagnosis not present

## 2018-05-03 MED ORDER — IOPAMIDOL (ISOVUE-300) INJECTION 61%
80.0000 mL | Freq: Once | INTRAVENOUS | Status: AC | PRN
  Administered 2018-05-03: 80 mL via INTRAVENOUS

## 2018-05-25 DIAGNOSIS — H26491 Other secondary cataract, right eye: Secondary | ICD-10-CM | POA: Diagnosis not present

## 2018-05-25 DIAGNOSIS — H53002 Unspecified amblyopia, left eye: Secondary | ICD-10-CM | POA: Diagnosis not present

## 2018-05-25 DIAGNOSIS — H1045 Other chronic allergic conjunctivitis: Secondary | ICD-10-CM | POA: Diagnosis not present

## 2018-05-30 DIAGNOSIS — H26493 Other secondary cataract, bilateral: Secondary | ICD-10-CM | POA: Diagnosis not present

## 2018-05-30 DIAGNOSIS — H26491 Other secondary cataract, right eye: Secondary | ICD-10-CM | POA: Diagnosis not present

## 2018-05-30 DIAGNOSIS — H26492 Other secondary cataract, left eye: Secondary | ICD-10-CM | POA: Diagnosis not present

## 2018-07-01 ENCOUNTER — Telehealth: Payer: Self-pay | Admitting: Family Medicine

## 2018-07-01 MED ORDER — FLUCONAZOLE 150 MG PO TABS: 150.0000 mg | ORAL_TABLET | Freq: Once | ORAL | 0 refills | Status: AC

## 2018-07-01 NOTE — Telephone Encounter (Signed)
Pt advised.   Thanks,   -Laura  

## 2018-07-01 NOTE — Telephone Encounter (Signed)
Dr. Rosanna Randy is not in office today please review patients chart and advise. KW

## 2018-07-01 NOTE — Telephone Encounter (Signed)
Have sent prescription for diflucan to [facility].

## 2018-07-01 NOTE — Telephone Encounter (Signed)
Pt was put on antibiotic from diverticulitis GI doctor.  Pt now has thrush in his mouth.  Pt was told by GI doctor to contact his PCP to be treated for thrush.  Please advise.  Thanks, [employer]

## 2018-07-11 ENCOUNTER — Ambulatory Visit (INDEPENDENT_AMBULATORY_CARE_PROVIDER_SITE_OTHER): Payer: PPO | Admitting: Family Medicine

## 2018-07-11 VITALS — BP 128/72 | HR 63 | Temp 97.0°F | Resp 16 | Wt 216.0 lb

## 2018-07-11 DIAGNOSIS — F329 Major depressive disorder, single episode, unspecified: Secondary | ICD-10-CM | POA: Diagnosis not present

## 2018-07-11 DIAGNOSIS — M7130 Other bursal cyst, unspecified site: Secondary | ICD-10-CM | POA: Diagnosis not present

## 2018-07-11 DIAGNOSIS — J321 Chronic frontal sinusitis: Secondary | ICD-10-CM | POA: Diagnosis not present

## 2018-07-11 DIAGNOSIS — R251 Tremor, unspecified: Secondary | ICD-10-CM

## 2018-07-11 DIAGNOSIS — F419 Anxiety disorder, unspecified: Secondary | ICD-10-CM | POA: Diagnosis not present

## 2018-07-11 DIAGNOSIS — F32A Anxiety disorder, unspecified: Secondary | ICD-10-CM

## 2018-07-11 DIAGNOSIS — F324 Major depressive disorder, single episode, in partial remission: Secondary | ICD-10-CM | POA: Diagnosis not present

## 2018-07-11 DIAGNOSIS — M4802 Spinal stenosis, cervical region: Secondary | ICD-10-CM | POA: Diagnosis not present

## 2018-07-11 DIAGNOSIS — K219 Gastro-esophageal reflux disease without esophagitis: Secondary | ICD-10-CM | POA: Diagnosis not present

## 2018-07-11 DIAGNOSIS — R269 Unspecified abnormalities of gait and mobility: Secondary | ICD-10-CM | POA: Diagnosis not present

## 2018-07-11 MED ORDER — DOXYCYCLINE HYCLATE 100 MG PO TABS: 100.0000 mg | ORAL_TABLET | Freq: Two times a day (BID) | ORAL | 0 refills | Status: DC

## 2018-07-11 NOTE — Progress Notes (Signed)
Randy Dean  MRN: [id number] DOB: [birth year]  Subjective:  HPI   Patient complains of sinus symptoms for 2 weeks.  He has head congestion, sinus pain and pressure, post nasal drainage, and ears stopped up.  Cough due to drainage.  Patient Active Problem List   Diagnosis Date Noted  . Diverticulitis large intestine 04/11/2018  . Prediabetes 10/21/2016  . Abnormality of gait 10/30/2015  . Headache 10/30/2015  . Anxiety and depression 11/23/2014  . Brain injuries ([place]) 11/23/2014  . Clinical depression 11/23/2014  . HLD (hyperlipidemia) 11/23/2014  . Insomnia due to medical condition 11/23/2014  . Injury of face and neck 11/23/2014  . Cervical pain 11/23/2014  . Loss of feeling or sensation 11/23/2014  . Tingling 11/23/2014  . Adiposity 11/23/2014  . Allergic rhinitis, seasonal 11/23/2014  . Neurosis, posttraumatic 11/23/2014  . Spinal stenosis in cervical region 11/23/2014  . Injury brain, traumatic ([place]) 11/23/2014  . Has a tremor 11/23/2014  . Disease of vein 11/23/2014  . Head revolving around 11/23/2014  . Shortness of breath 03/14/2013  . Anxiety 10/11/2008  . Intervertebral cervical disc disorder with myelopathy, cervical region 10/11/2008  . Musculoskeletal disorder and symptoms referable to neck 10/11/2008  . Acid reflux 10/11/2008    Past Medical History:  Diagnosis Date  . Abnormality of gait 10/30/2015  . Allergic rhinitis   . Anxiety   . Chronic headaches    partially sinus/partially brain injury (per pt)  . GERD (gastroesophageal reflux disease)   . Head trauma 2010   has caused anxiety, memory issues and neck problems  . Headache 10/30/2015   Right temporal   . Neck stiffness    metal plate.  mild limitations of side to side and up and dowm movement  . Prediabetes   . PTSD (post-traumatic stress disorder)   . Stroke [facility])    TIA x3- some residual right side weakness  . Vertigo   . Weakness of right side of body    from TIAs    Social  History   Socioeconomic History  . Marital status: Married    Spouse name: Lattie Haw  . Number of children: 0  . Years of education: 71  . Highest education level: Not on file  Occupational History  . Not on file  Social Needs  . Financial resource strain: Not on file  . Food insecurity:    Worry: Not on file    Inability: Not on file  . Transportation needs:    Medical: Not on file    Non-medical: Not on file  Tobacco Use  . Smoking status: Former Smoker    Packs/day: 0.50    Years: 3.00    Pack years: 1.50    Last attempt to quit: 06/28/1984    Years since quitting: 34.0  . Smokeless tobacco: Current User    Types: Snuff    Last attempt to quit: 06/29/1986  . Tobacco comment: 1 can dip/day  Substance and Sexual Activity  . Alcohol use: No    Alcohol/week: 0.0 standard drinks  . Drug use: No  . Sexual activity: Not on file  Lifestyle  . Physical activity:    Days per week: Not on file    Minutes per session: Not on file  . Stress: Not on file  Relationships  . Social connections:    Talks on phone: Not on file    Gets together: Not on file    Attends religious service: Not on file  Active member of club or organization: Not on file    Attends meetings of clubs or organizations: Not on file    Relationship status: Not on file  . Intimate partner violence:    Fear of current or ex partner: Not on file    Emotionally abused: Not on file    Physically abused: Not on file    Forced sexual activity: Not on file  Other Topics Concern  . Not on file  Social History Narrative   Lives at home with his wife Lattie Haw   Right-handed   Drinks tea or soda about 6 times per day    Outpatient Encounter Medications as of 07/11/2018  Medication Sig  . acetaminophen (TYLENOL) 500 MG tablet Take 500 mg by mouth every 6 (six) hours as needed for mild pain.   Marland Kitchen dexlansoprazole (DEXILANT) 60 MG capsule Take 1 capsule (60 mg total) by mouth daily.  . fluticasone (FLONASE) 50 MCG/ACT  nasal spray Place 2 sprays into both nostrils 2 (two) times daily.  Marland Kitchen loratadine (CLARITIN) 10 MG tablet Take 10 mg by mouth daily.  . Multiple Vitamin (MULTIVITAMIN) capsule Take 1 capsule by mouth daily.  . sertraline (ZOLOFT) 50 MG tablet Take 1 tablet (50 mg total) by mouth daily.  Marland Kitchen ALPRAZolam (XANAX) 0.5 MG tablet Take 2 tablets approximately 45 minutes prior to the MRI study, take a third tablet if needed. (Patient not taking: Reported on 04/11/2018)  . diazepam (VALIUM) 5 MG tablet Take 1 tablet (5 mg total) by mouth See admin instructions. 1 PO 1 hour prior to procedure, may repeat if needed (Patient not taking: Reported on 04/11/2018)  . gabapentin (NEURONTIN) 100 MG capsule Take 300 mg by mouth daily.   Marland Kitchen nystatin (MYCOSTATIN) 100000 UNIT/ML suspension Take 5 mLs (500,000 Units total) by mouth 4 (four) times daily. (Patient not taking: Reported on 07/11/2018)  . oxyCODONE (OXY IR/ROXICODONE) 5 MG immediate release tablet Take 1 tablet (5 mg total) by mouth every 6 (six) hours as needed for severe pain. (Patient not taking: Reported on 07/11/2018)   No facility-administered encounter medications on file as of 07/11/2018.     Allergies  Allergen Reactions  . Serzone [Nefazodone]     Tongue and facial swelling  . Codeine Nausea Only    Dizziness, sweating  . Effexor [Venlafaxine]     "Feels out of it"  . Paxil [Paroxetine Hcl]     "does not feel right in the head"  . Esomeprazole Sodium Rash    Review of Systems  Constitutional: Positive for malaise/fatigue.  HENT: Positive for congestion, ear pain, nosebleeds, sinus pain and tinnitus. Negative for ear discharge, hearing loss and sore throat.   Eyes: Negative.   Respiratory: Positive for cough (from drainage) and shortness of breath (chronic, tremor and anxiety related.). Negative for sputum production and wheezing.   Cardiovascular: Negative.   Gastrointestinal: Positive for heartburn.  Musculoskeletal: Positive for joint pain.        He has tender swelling and a mild contracture of the right index finger.  No known trauma.  Skin: Negative.     Objective:  BP 128/72 (BP Location: Right Arm, Patient Position: Sitting, Cuff Size: Normal)   Pulse 63   Temp (!) 97 F (36.1 C) (Oral)   Resp 16   Wt 216 lb (98 kg)   BMI 29.29 kg/m   Physical Exam  Constitutional: He is oriented to person, place, and time and well-developed, well-nourished, and in no distress.  HENT:  Head: Normocephalic and atraumatic.  Right Ear: External ear normal.  Left Ear: External ear normal.  Nose: Nose normal.  Mouth/Throat: Oropharynx is clear and moist.  Maxillary sinus tenderness.  Eyes: No scleral icterus.  Neck: No thyromegaly present.  Cardiovascular: Normal rate, regular rhythm and normal heart sounds.  Pulmonary/Chest: Effort normal and breath sounds normal.  Abdominal: Soft.  Musculoskeletal:        General: Deformity present.     Comments: Mild contracture and growth in the index finger consistent with synovial cyst versus scarring of the tendon sheath.  No signs or symptoms of infection.  Neurological: He is alert and oriented to person, place, and time. Gait normal. GCS score is 15.  Spastic movements of all limbs.  Skin: Skin is warm and dry.  Psychiatric: Mood, memory, affect and judgment normal.    Assessment and Plan :  1. Frontal sinusitis, unspecified chronicity  - doxycycline (VIBRA-TABS) 100 MG tablet; Take 1 tablet (100 mg total) by mouth 2 (two) times daily.  Dispense: 20 tablet; Refill: 0  2. Synovial cyst I have no treatment offered.  Refer to hand surgery. - Ambulatory referral to Orthopedic Surgery  3. Gastroesophageal reflux disease, esophagitis presence not specified Consider Carafate.  4. Spinal stenosis in cervical region   5. Abnormality of gait Treat her spinal disease and TBI.  6. Has a tremor   7. Major depressive disorder with single episode, in partial remission ([place])   8.  Anxiety and depression Ongoing issue.  Will discuss psychiatry referral in the future.  As his affect is good, I just think he has some underlying issues with anxiety and depression.  I have done the exam and reviewed the chart and it is accurate to the best of my knowledge. Development worker, community has been used and  any errors in dictation or transcription are unintentional. Miguel Aschoff M.D. [facility]

## 2018-07-19 DIAGNOSIS — R2231 Localized swelling, mass and lump, right upper limb: Secondary | ICD-10-CM | POA: Diagnosis not present

## 2018-07-26 DIAGNOSIS — Z01818 Encounter for other preprocedural examination: Secondary | ICD-10-CM | POA: Diagnosis not present

## 2018-07-28 ENCOUNTER — Ambulatory Visit: Payer: Self-pay | Admitting: Family Medicine

## 2018-08-05 DIAGNOSIS — K219 Gastro-esophageal reflux disease without esophagitis: Secondary | ICD-10-CM | POA: Diagnosis not present

## 2018-08-05 DIAGNOSIS — R2231 Localized swelling, mass and lump, right upper limb: Secondary | ICD-10-CM | POA: Diagnosis not present

## 2018-08-05 DIAGNOSIS — Z885 Allergy status to narcotic agent status: Secondary | ICD-10-CM | POA: Diagnosis not present

## 2018-08-05 DIAGNOSIS — Z91041 Radiographic dye allergy status: Secondary | ICD-10-CM | POA: Diagnosis not present

## 2018-08-05 DIAGNOSIS — G959 Disease of spinal cord, unspecified: Secondary | ICD-10-CM | POA: Diagnosis not present

## 2018-08-05 DIAGNOSIS — L728 Other follicular cysts of the skin and subcutaneous tissue: Secondary | ICD-10-CM | POA: Diagnosis not present

## 2018-08-05 DIAGNOSIS — Z87820 Personal history of traumatic brain injury: Secondary | ICD-10-CM | POA: Diagnosis not present

## 2018-08-05 DIAGNOSIS — K5790 Diverticulosis of intestine, part unspecified, without perforation or abscess without bleeding: Secondary | ICD-10-CM | POA: Diagnosis not present

## 2018-08-05 DIAGNOSIS — R531 Weakness: Secondary | ICD-10-CM | POA: Diagnosis not present

## 2018-08-05 DIAGNOSIS — R251 Tremor, unspecified: Secondary | ICD-10-CM | POA: Diagnosis not present

## 2018-08-05 DIAGNOSIS — Z888 Allergy status to other drugs, medicaments and biological substances status: Secondary | ICD-10-CM | POA: Diagnosis not present

## 2018-08-05 DIAGNOSIS — G4700 Insomnia, unspecified: Secondary | ICD-10-CM | POA: Diagnosis not present

## 2018-08-11 ENCOUNTER — Telehealth: Payer: Self-pay

## 2018-08-11 NOTE — Telephone Encounter (Signed)
Patient called requesting a list of all the doctors he has seen in the last 5 years. He also needs a list of his medications that includes the medication name, dosage and frequency. Call back is [phone]. Patient needs this information to give to his insurance company. The company that provides his insurance is requesting this information.

## 2018-08-17 ENCOUNTER — Ambulatory Visit (INDEPENDENT_AMBULATORY_CARE_PROVIDER_SITE_OTHER): Payer: PPO | Admitting: Family Medicine

## 2018-08-17 ENCOUNTER — Other Ambulatory Visit: Payer: Self-pay | Admitting: Family Medicine

## 2018-08-17 VITALS — BP 120/74 | HR 80 | Temp 99.4°F | Resp 20 | Wt 214.0 lb

## 2018-08-17 DIAGNOSIS — J45909 Unspecified asthma, uncomplicated: Secondary | ICD-10-CM

## 2018-08-17 DIAGNOSIS — K219 Gastro-esophageal reflux disease without esophagitis: Secondary | ICD-10-CM

## 2018-08-17 MED ORDER — AZITHROMYCIN 250 MG PO TABS: ORAL_TABLET | ORAL | 0 refills | Status: DC

## 2018-08-17 MED ORDER — ALBUTEROL SULFATE HFA 108 (90 BASE) MCG/ACT IN AERS: 1.0000 | INHALATION_SPRAY | RESPIRATORY_TRACT | 0 refills | Status: DC | PRN

## 2018-08-17 NOTE — Progress Notes (Signed)
Patient: Randy Dean Male    DOB: Feb 28, 1966   53 y.o.   MRN: [id number] Visit Date: 08/17/2018  Today's Provider: [name], MD   Chief Complaint  Patient presents with  . URI   Subjective:     URI   This is a new problem. The current episode started in the past 7 days (about 4 days). The maximum temperature recorded prior to his arrival was 100.4 - 100.9 F. The fever has been present for less than 1 day. Associated symptoms include coughing, headaches, a plugged ear sensation, rhinorrhea, sinus pain and a sore throat. He has tried acetaminophen and decongestant for the symptoms. The treatment provided no relief.    Allergies  Allergen Reactions  . Serzone [Nefazodone]     Tongue and facial swelling  . Codeine Nausea Only    Dizziness, sweating  . Doxycycline Other (See Comments)    Severe headaches  . Effexor [Venlafaxine]     "Feels out of it"  . Paxil [Paroxetine Hcl]     "does not feel right in the head"  . Esomeprazole Sodium Rash     Current Outpatient Medications:  .  acetaminophen (TYLENOL) 500 MG tablet, Take 500 mg by mouth every 6 (six) hours as needed for mild pain. , Disp: , Rfl:  .  dexlansoprazole (DEXILANT) 60 MG capsule, Take 1 capsule (60 mg total) by mouth daily., Disp: 90 capsule, Rfl: 3 .  fluticasone (FLONASE) 50 MCG/ACT nasal spray, Place 2 sprays into both nostrils 2 (two) times daily., Disp: 96 g, Rfl: 3 .  gabapentin (NEURONTIN) 100 MG capsule, Take 300 mg by mouth daily. , Disp: , Rfl:  .  loratadine (CLARITIN) 10 MG tablet, Take 10 mg by mouth daily., Disp: , Rfl:  .  Multiple Vitamin (MULTIVITAMIN) capsule, Take 1 capsule by mouth daily., Disp: , Rfl:  .  sertraline (ZOLOFT) 50 MG tablet, Take 1 tablet (50 mg total) by mouth daily., Disp: 90 tablet, Rfl: 3 .  ALPRAZolam (XANAX) 0.5 MG tablet, Take 2 tablets approximately 45 minutes prior to the MRI study, take a third tablet if needed. (Patient not taking: Reported on  04/11/2018), Disp: 3 tablet, Rfl: 0 .  diazepam (VALIUM) 5 MG tablet, Take 1 tablet (5 mg total) by mouth See admin instructions. 1 PO 1 hour prior to procedure, may repeat if needed (Patient not taking: Reported on 04/11/2018), Disp: 3 tablet, Rfl: 0 .  doxycycline (VIBRA-TABS) 100 MG tablet, Take 1 tablet (100 mg total) by mouth 2 (two) times daily., Disp: 20 tablet, Rfl: 0 .  nystatin (MYCOSTATIN) 100000 UNIT/ML suspension, Take 5 mLs (500,000 Units total) by mouth 4 (four) times daily. (Patient not taking: Reported on 07/11/2018), Disp: 60 mL, Rfl: 0 .  oxyCODONE (OXY IR/ROXICODONE) 5 MG immediate release tablet, Take 1 tablet (5 mg total) by mouth every 6 (six) hours as needed for severe pain. (Patient not taking: Reported on 07/11/2018), Disp: 30 tablet, Rfl: 0  Review of Systems  Constitutional: Positive for chills and fatigue.  HENT: Positive for rhinorrhea, sinus pain and sore throat.   Eyes: Negative.   Respiratory: Positive for cough.   Cardiovascular: Negative.   Gastrointestinal: Negative.   Allergic/Immunologic: Negative.   Neurological: Positive for headaches.  Hematological: Negative.   Psychiatric/Behavioral: Negative.     Social History   Tobacco Use  . Smoking status: Former Smoker    Packs/day: 0.50    Years: 3.00  Pack years: 1.50    Last attempt to quit: 06/28/1984    Years since quitting: 34.1  . Smokeless tobacco: Current User    Types: Snuff    Last attempt to quit: 06/29/1986  . Tobacco comment: 1 can dip/day  Substance Use Topics  . Alcohol use: No    Alcohol/week: 0.0 standard drinks      Objective:   BP 120/74 (BP Location: Left Arm, Patient Position: Sitting, Cuff Size: Normal)   Pulse 80   Temp 99.4 F (37.4 C)   Resp 20   Wt 214 lb (97.1 kg)   SpO2 96%   BMI 29.02 kg/m  Vitals:   08/17/18 1601  BP: 120/74  Pulse: 80  Resp: 20  Temp: 99.4 F (37.4 C)  SpO2: 96%  Weight: 214 lb (97.1 kg)     Physical Exam Vitals signs reviewed.    Constitutional:      Appearance: Normal appearance. He is normal weight.  HENT:     Head: Normocephalic and atraumatic.     Right Ear: Tympanic membrane and external ear normal.     Left Ear: Tympanic membrane and external ear normal.     Nose: Nose normal.     Mouth/Throat:     Pharynx: Oropharynx is clear.  Eyes:     Conjunctiva/sclera: Conjunctivae normal.  Cardiovascular:     Rate and Rhythm: Normal rate and regular rhythm.     Pulses: Normal pulses.     Heart sounds: Normal heart sounds.  Pulmonary:     Effort: Pulmonary effort is normal.     Breath sounds: Normal breath sounds.  Abdominal:     Palpations: Abdomen is soft.  Lymphadenopathy:     Cervical: No cervical adenopathy.  Skin:    General: Skin is warm and dry.  Neurological:     General: No focal deficit present.     Mental Status: He is alert and oriented to person, place, and time. Mental status is at baseline.  Psychiatric:        Mood and Affect: Mood normal.        Behavior: Behavior normal.        Thought Content: Thought content normal.        Judgment: Judgment normal.         Assessment & Plan    1. Asthmatic bronchitis without complication, unspecified asthma severity, unspecified whether persistent Consider steroids. - azithromycin (ZITHROMAX) 250 MG tablet; Take 2 tablets on the first day, then take one tablet daily  Dispense: 6 tablet; Refill: 0 - albuterol (PROVENTIL HFA) 108 (90 Base) MCG/ACT inhaler; Inhale 1 puff into the lungs every 4 (four) hours as needed for wheezing or shortness of breath.  Dispense: 1 Inhaler; Refill: 0    I have done the exam and reviewed the above chart and it is accurate to the best of my knowledge. Development worker, community has been used in this note in any air is in the dictation or transcription are unintentional.  [name], MD  [place]

## 2018-08-18 ENCOUNTER — Other Ambulatory Visit: Payer: Self-pay | Admitting: Family Medicine

## 2018-08-18 DIAGNOSIS — K219 Gastro-esophageal reflux disease without esophagitis: Secondary | ICD-10-CM

## 2018-08-18 MED ORDER — DEXLANSOPRAZOLE 60 MG PO CPDR: DELAYED_RELEASE_CAPSULE | ORAL | 11 refills | Status: DC

## 2018-08-18 NOTE — Telephone Encounter (Signed)
Done

## 2018-08-18 NOTE — Telephone Encounter (Signed)
Ok to rf for 1 year. 

## 2018-08-18 NOTE — Telephone Encounter (Signed)
[facility] faxed refill request for the following medications:  dexlansoprazole (DEXILANT) 60 MG capsule  Last Rx: 07/20/17 LOV: 08/17/2018 Please advise. Thanks TNP

## 2018-08-18 NOTE — Telephone Encounter (Signed)
Please review, okay to refill? KW

## 2018-08-22 ENCOUNTER — Telehealth: Payer: Self-pay

## 2018-08-22 DIAGNOSIS — J45909 Unspecified asthma, uncomplicated: Secondary | ICD-10-CM

## 2018-08-22 MED ORDER — AMOXICILLIN-POT CLAVULANATE 875-125 MG PO TABS: 1.0000 | ORAL_TABLET | Freq: Two times a day (BID) | ORAL | 0 refills | Status: DC

## 2018-08-22 MED ORDER — PREDNISONE 10 MG (21) PO TBPK: ORAL_TABLET | ORAL | 0 refills | Status: DC

## 2018-08-22 NOTE — Telephone Encounter (Signed)
Patient called saying that he is not feeling much better. He was seen in the office on 2/19 with URI and reports that he completed his abx. Patient reports that he still has a productive cough and shortness of breath during his "coughing spells". Patient denies fevers. He has not taken anything OTC.   Per Dr. Rosanna Randy, will treat with Augmentin and prednisone 10mg  6 day taper. If not improving, will need to be seen. Patient advised and verbalized understanding.

## 2018-08-25 ENCOUNTER — Ambulatory Visit
Admission: RE | Admit: 2018-08-25 | Discharge: 2018-08-25 | Disposition: A | Discharge status: Home / Self Care | Payer: PPO | Source: Ambulatory Visit | Attending: Family Medicine | Admitting: Family Medicine

## 2018-08-25 ENCOUNTER — Encounter: Payer: Self-pay | Admitting: Family Medicine

## 2018-08-25 ENCOUNTER — Ambulatory Visit: Payer: Self-pay | Admitting: Family Medicine

## 2018-08-25 ENCOUNTER — Ambulatory Visit (INDEPENDENT_AMBULATORY_CARE_PROVIDER_SITE_OTHER): Payer: PPO | Admitting: Family Medicine

## 2018-08-25 VITALS — BP 122/74 | HR 76 | Temp 98.2°F | Resp 18 | Wt 217.0 lb

## 2018-08-25 DIAGNOSIS — R079 Chest pain, unspecified: Secondary | ICD-10-CM | POA: Diagnosis not present

## 2018-08-25 DIAGNOSIS — R0602 Shortness of breath: Secondary | ICD-10-CM | POA: Insufficient documentation

## 2018-08-25 NOTE — Progress Notes (Signed)
Patient: Randy Dean Male    DOB: 03-Jan-1966   53 y.o.   MRN: [id number] Visit Date: 08/25/2018  Today's Provider: Vernie Murders, PA   Chief Complaint  Patient presents with  . Follow-up    Asthmatic bronchitis   Subjective:     HPI   Asthmatic bronchitis without complication, unspecified asthma severity, unspecified whether persistent From 08/17/2018-seen by Dr. Rosanna Randy. Advised to consider steroids. Given rx for azithromycin (ZITHROMAX) 250 MG tablet and albuterol (PROVENTIL HFA) 108 (90 Base) MCG/ACT inhaler.  Patient has completed azithromycin and started taking Augmentin. Patient states he is uses albuterol inhaler with only very mild relief. Patient is no better, still having sob, chest pressure and cough.   Past Medical History:  Diagnosis Date  . Abnormality of gait 10/30/2015  . Allergic rhinitis   . Anxiety   . Chronic headaches    partially sinus/partially brain injury (per pt)  . GERD (gastroesophageal reflux disease)   . Head trauma 2010   has caused anxiety, memory issues and neck problems  . Headache 10/30/2015   Right temporal   . Neck stiffness    metal plate.  mild limitations of side to side and up and dowm movement  . Prediabetes   . PTSD (post-traumatic stress disorder)   . Stroke [facility])    TIA x3- some residual right side weakness  . Vertigo   . Weakness of right side of body    from TIAs   Past Surgical History:  Procedure Laterality Date  . BACK SURGERY    . ENDOSCOPIC TURBINATE REDUCTION Bilateral 08/22/2015   Procedure: ENDOSCOPIC TURBINATE REDUCTION;  Surgeon: Margaretha Sheffield, MD;  Location: [place];  Service: ENT;  Laterality: Bilateral;  . EYE MUSCLE SURGERY Left    Due to conential left amblyopia  . NASAL SINUS SURGERY  september 2016  . NECK SURGERY     Family History  Problem Relation Age of Onset  . Fibromyalgia Mother   . Heart disease Father   . Tremor Father   . Diabetes Maternal Grandmother   .  Hypertension Maternal Grandmother   . Hypertension Maternal Grandfather   . Diabetes Paternal Grandmother   . Hypertension Paternal Grandmother   . Hypertension Paternal Grandfather    Allergies  Allergen Reactions  . Serzone [Nefazodone]     Tongue and facial swelling  . Codeine Nausea Only    Dizziness, sweating  . Doxycycline Other (See Comments)    Severe headaches  . Effexor [Venlafaxine]     "Feels out of it"  . Paxil [Paroxetine Hcl]     "does not feel right in the head"  . Esomeprazole Sodium Rash    Current Outpatient Medications:  .  acetaminophen (TYLENOL) 500 MG tablet, Take 500 mg by mouth every 6 (six) hours as needed for mild pain. , Disp: , Rfl:  .  albuterol (PROVENTIL HFA) 108 (90 Base) MCG/ACT inhaler, Inhale 1 puff into the lungs every 4 (four) hours as needed for wheezing or shortness of breath., Disp: 1 Inhaler, Rfl: 0 .  ALPRAZolam (XANAX) 0.5 MG tablet, Take 2 tablets approximately 45 minutes prior to the MRI study, take a third tablet if needed., Disp: 3 tablet, Rfl: 0 .  amoxicillin-clavulanate (AUGMENTIN) 875-125 MG tablet, Take 1 tablet by mouth 2 (two) times daily for 5 days., Disp: 10 tablet, Rfl: 0 .  dexlansoprazole (DEXILANT) 60 MG capsule, Take 1 capsule (60 mg total) by mouth daily., Disp: 30  capsule, Rfl: 11 .  diazepam (VALIUM) 5 MG tablet, Take 1 tablet (5 mg total) by mouth See admin instructions. 1 PO 1 hour prior to procedure, may repeat if needed, Disp: 3 tablet, Rfl: 0 .  fluticasone (FLONASE) 50 MCG/ACT nasal spray, Place 2 sprays into both nostrils 2 (two) times daily., Disp: 96 g, Rfl: 0 .  gabapentin (NEURONTIN) 100 MG capsule, Take 300 mg by mouth daily. , Disp: , Rfl:  .  loratadine (CLARITIN) 10 MG tablet, Take 10 mg by mouth daily., Disp: , Rfl:  .  Multiple Vitamin (MULTIVITAMIN) capsule, Take 1 capsule by mouth daily., Disp: , Rfl:  .  nystatin (MYCOSTATIN) 100000 UNIT/ML suspension, Take 5 mLs (500,000 Units total) by mouth 4  (four) times daily., Disp: 60 mL, Rfl: 0 .  oxyCODONE (OXY IR/ROXICODONE) 5 MG immediate release tablet, Take 1 tablet (5 mg total) by mouth every 6 (six) hours as needed for severe pain., Disp: 30 tablet, Rfl: 0 .  predniSONE (STERAPRED UNI-PAK 21 TAB) 10 MG (21) TBPK tablet, Taper as directed., Disp: 21 tablet, Rfl: 0 .  sertraline (ZOLOFT) 50 MG tablet, Take 1 tablet (50 mg total) by mouth daily., Disp: 90 tablet, Rfl: 3 .  azithromycin (ZITHROMAX) 250 MG tablet, Take 2 tablets on the first day, then take one tablet daily (Patient not taking: Reported on 08/25/2018), Disp: 6 tablet, Rfl: 0  Review of Systems  Constitutional: Negative for appetite change, chills and fever.  Respiratory: Negative for chest tightness, shortness of breath and wheezing.   Cardiovascular: Negative for chest pain and palpitations.  Gastrointestinal: Negative for abdominal pain, nausea and vomiting.   Social History   Tobacco Use  . Smoking status: Former Smoker    Packs/day: 0.50    Years: 3.00    Pack years: 1.50    Last attempt to quit: 06/28/1984    Years since quitting: 34.1  . Smokeless tobacco: Current User    Types: Snuff    Last attempt to quit: 06/29/1986  . Tobacco comment: 1 can dip/day  Substance Use Topics  . Alcohol use: No    Alcohol/week: 0.0 standard drinks     Objective:   BP 122/74 (BP Location: Right Arm, Patient Position: Sitting, Cuff Size: Large)   Pulse 76   Temp 98.2 F (36.8 C) (Oral)   Resp 18   Wt 217 lb (98.4 kg)   SpO2 98%   BMI 29.43 kg/m  Vitals:   08/25/18 1433  BP: 122/74  Pulse: 76  Resp: 18  Temp: 98.2 F (36.8 C)  TempSrc: Oral  SpO2: 98%  Weight: 217 lb (98.4 kg)   Physical Exam Constitutional:      General: He is in acute distress.     Appearance: He is well-developed.  HENT:     Head: Normocephalic and atraumatic.     Right Ear: Hearing and tympanic membrane normal.     Left Ear: Hearing and tympanic membrane normal.     Nose: Nose normal.    Eyes:     General: Lids are normal. No scleral icterus.       Right eye: No discharge.        Left eye: No discharge.     Conjunctiva/sclera: Conjunctivae normal.  Neck:     Musculoskeletal: Normal range of motion and neck supple.  Cardiovascular:     Rate and Rhythm: Regular rhythm.     Heart sounds: Normal heart sounds.  Pulmonary:     Effort: Pulmonary  effort is normal. No respiratory distress.     Breath sounds: No wheezing, rhonchi or rales.     Comments: Initially tachypneic with respirations 30-40. Calmed down to 18 and felt less tingling in hands, feet and forehead. Abdominal:     General: Bowel sounds are normal.     Palpations: Abdomen is soft.  Musculoskeletal: Normal range of motion.  Skin:    Findings: No lesion or rash.  Neurological:     Mental Status: He is alert and oriented to person, place, and time.  Psychiatric:        Speech: Speech normal.        Behavior: Behavior normal.        Thought Content: Thought content normal.       Assessment & Plan    1. Chest pain, unspecified type Onset with shortness of breath/rapid respirations (up to 30-40 per minute upon arrival in the office - calmed to 18 and discomfort eased). Still taking Augmentin for asthmatic bronchitis. EKG normal. Will get labs to rule out dehydration, anemia or infection. Check CXR for any pneumonia/infiltrate. Finish Augmentin and limit Albuterol-HFA to no more than 3 times a day and only on a PRN basis (no wheezing at the present - he had been using it every 4 hours day and night). He was "afraid" to use the prednisone prescribed on 08-17-18 because it has caused panic/anxiety attacks in the past. May need a little benzodiazepine but did not want to at this time. Recheck pending lab and x-ray report. - EKG 12-Lead - CBC with Differential/Platelet - Comprehensive metabolic panel - DG Chest 2 View  2. Shortness of breath Worsening today with fast respiration rate upon arrival to the office.  Some tingling of his forehead, hands and feet with lightheaded sensation - suspect hyperventilation. States he had been using the Albuterol-HFA inhaler every 4 hours "day and night". Recommended getting labs to look for electrolyte imbalance, hypercapnia, signs of infection or dehydration. Increase fluid intake, limit Albuterol usage to only prn use up to 3 times in any 24 hour period (no wheezing at the present). EKG NSR without tachycardia. - EKG 12-Lead - CBC with Differential/Platelet - Comprehensive metabolic panel - DG Chest 2 View     Vernie Murders, [place]  [facility]

## 2018-08-25 NOTE — Progress Notes (Deleted)
Patient: Randy Dean Male    DOB: 27-Jul-1965   53 y.o.   MRN: [id number] Visit Date: 08/25/2018  Today's Provider: Vernie Murders, PA   No chief complaint on file.  Subjective:     HPI   Asthmatic bronchitis without complication, unspecified asthma severity, unspecified whether persistent From 08/17/2018-patient was seen by Dr. Rosanna Randy. Consider steroids. Given rx for azithromycin (ZITHROMAX) 250 MG tablet and albuterol (PROVENTIL HFA) 108 (90 Base) MCG/ACT inhaler.   Allergies  Allergen Reactions  . Serzone [Nefazodone]     Tongue and facial swelling  . Codeine Nausea Only    Dizziness, sweating  . Doxycycline Other (See Comments)    Severe headaches  . Effexor [Venlafaxine]     "Feels out of it"  . Paxil [Paroxetine Hcl]     "does not feel right in the head"  . Esomeprazole Sodium Rash     Current Outpatient Medications:  .  acetaminophen (TYLENOL) 500 MG tablet, Take 500 mg by mouth every 6 (six) hours as needed for mild pain. , Disp: , Rfl:  .  albuterol (PROVENTIL HFA) 108 (90 Base) MCG/ACT inhaler, Inhale 1 puff into the lungs every 4 (four) hours as needed for wheezing or shortness of breath., Disp: 1 Inhaler, Rfl: 0 .  ALPRAZolam (XANAX) 0.5 MG tablet, Take 2 tablets approximately 45 minutes prior to the MRI study, take a third tablet if needed. (Patient not taking: Reported on 04/11/2018), Disp: 3 tablet, Rfl: 0 .  amoxicillin-clavulanate (AUGMENTIN) 875-125 MG tablet, Take 1 tablet by mouth 2 (two) times daily for 5 days., Disp: 10 tablet, Rfl: 0 .  azithromycin (ZITHROMAX) 250 MG tablet, Take 2 tablets on the first day, then take one tablet daily, Disp: 6 tablet, Rfl: 0 .  dexlansoprazole (DEXILANT) 60 MG capsule, Take 1 capsule (60 mg total) by mouth daily., Disp: 30 capsule, Rfl: 11 .  diazepam (VALIUM) 5 MG tablet, Take 1 tablet (5 mg total) by mouth See admin instructions. 1 PO 1 hour prior to procedure, may repeat if needed (Patient not taking:  Reported on 04/11/2018), Disp: 3 tablet, Rfl: 0 .  fluticasone (FLONASE) 50 MCG/ACT nasal spray, Place 2 sprays into both nostrils 2 (two) times daily., Disp: 96 g, Rfl: 0 .  gabapentin (NEURONTIN) 100 MG capsule, Take 300 mg by mouth daily. , Disp: , Rfl:  .  loratadine (CLARITIN) 10 MG tablet, Take 10 mg by mouth daily., Disp: , Rfl:  .  Multiple Vitamin (MULTIVITAMIN) capsule, Take 1 capsule by mouth daily., Disp: , Rfl:  .  nystatin (MYCOSTATIN) 100000 UNIT/ML suspension, Take 5 mLs (500,000 Units total) by mouth 4 (four) times daily. (Patient not taking: Reported on 07/11/2018), Disp: 60 mL, Rfl: 0 .  oxyCODONE (OXY IR/ROXICODONE) 5 MG immediate release tablet, Take 1 tablet (5 mg total) by mouth every 6 (six) hours as needed for severe pain. (Patient not taking: Reported on 07/11/2018), Disp: 30 tablet, Rfl: 0 .  predniSONE (STERAPRED UNI-PAK 21 TAB) 10 MG (21) TBPK tablet, Taper as directed., Disp: 21 tablet, Rfl: 0 .  sertraline (ZOLOFT) 50 MG tablet, Take 1 tablet (50 mg total) by mouth daily., Disp: 90 tablet, Rfl: 3  Review of Systems  Constitutional: Negative for appetite change, chills and fever.  Respiratory: Negative for chest tightness, shortness of breath and wheezing.   Cardiovascular: Negative for chest pain and palpitations.  Gastrointestinal: Negative for abdominal pain, nausea and vomiting.    Social History  Tobacco Use  . Smoking status: Former Smoker    Packs/day: 0.50    Years: 3.00    Pack years: 1.50    Last attempt to quit: 06/28/1984    Years since quitting: 34.1  . Smokeless tobacco: Current User    Types: Snuff    Last attempt to quit: 06/29/1986  . Tobacco comment: 1 can dip/day  Substance Use Topics  . Alcohol use: No    Alcohol/week: 0.0 standard drinks      Objective:   There were no vitals taken for this visit. There were no vitals filed for this visit.   Physical Exam      Assessment & Plan        Vernie Murders, PA  [facility]

## 2018-08-26 ENCOUNTER — Telehealth: Payer: Self-pay | Admitting: Family Medicine

## 2018-08-26 LAB — COMPREHENSIVE METABOLIC PANEL
ALT: 23 IU/L (ref 0–44)
AST: 23 IU/L (ref 0–40)
Albumin/Globulin Ratio: 1.6 (ref 1.2–2.2)
Albumin: 4.1 g/dL (ref 3.8–4.9)
Alkaline Phosphatase: 68 IU/L (ref 39–117)
BUN/Creatinine Ratio: 9 (ref 9–20)
BUN: 16 mg/dL (ref 6–24)
Bilirubin Total: 0.6 mg/dL (ref 0.0–1.2)
CO2: 24 mmol/L (ref 20–29)
Calcium: 9.2 mg/dL (ref 8.7–10.2)
Chloride: 106 mmol/L (ref 96–106)
Creatinine, Ser: 1.79 mg/dL — ABNORMAL HIGH (ref 0.76–1.27)
GFR calc Af Amer: 49 mL/min/{1.73_m2} — ABNORMAL LOW (ref >59)
GFR calc non Af Amer: 42 mL/min/{1.73_m2} — ABNORMAL LOW (ref >59)
Globulin, Total: 2.5 g/dL (ref 1.5–4.5)
Glucose: 84 mg/dL (ref 65–99)
Potassium: 4.3 mmol/L (ref 3.5–5.2)
Sodium: 144 mmol/L (ref 134–144)
Total Protein: 6.6 g/dL (ref 6.0–8.5)

## 2018-08-26 LAB — CBC WITH DIFFERENTIAL/PLATELET
Basophils Absolute: 0 10*3/uL (ref 0.0–0.2)
Basos: 0 %
EOS (ABSOLUTE): 0.2 10*3/uL (ref 0.0–0.4)
Eos: 2 %
Hematocrit: 46.1 % (ref 37.5–51.0)
Hemoglobin: 15.8 g/dL (ref 13.0–17.7)
Immature Grans (Abs): 0 10*3/uL (ref 0.0–0.1)
Immature Granulocytes: 0 %
Lymphocytes Absolute: 2 10*3/uL (ref 0.7–3.1)
Lymphs: 27 %
MCH: 30.4 pg (ref 26.6–33.0)
MCHC: 34.3 g/dL (ref 31.5–35.7)
MCV: 89 fL (ref 79–97)
Monocytes Absolute: 0.6 10*3/uL (ref 0.1–0.9)
Monocytes: 8 %
Neutrophils Absolute: 4.7 10*3/uL (ref 1.4–7.0)
Neutrophils: 63 %
Platelets: 197 10*3/uL (ref 150–450)
RBC: 5.2 x10E6/uL (ref 4.14–5.80)
RDW: 12.5 % (ref 11.6–15.4)
WBC: 7.5 10*3/uL (ref 3.4–10.8)

## 2018-08-26 NOTE — Telephone Encounter (Signed)
Patient was advised.  

## 2018-08-26 NOTE — Telephone Encounter (Signed)
Pt called wanting to know if we have the results from his cxr and labs he had done yesterday  CB#  [phone]  Thanks Con Memos

## 2018-08-26 NOTE — Telephone Encounter (Signed)
Please advise results? 

## 2018-08-26 NOTE — Telephone Encounter (Signed)
See result note.  

## 2018-09-02 ENCOUNTER — Telehealth: Payer: Self-pay

## 2018-09-02 NOTE — Telephone Encounter (Signed)
If having fever, should schedule for recheck appointment tomorrow to assess progress. If fever over 101, should go to ER. Can recheck progress (without fever), Monday.

## 2018-09-02 NOTE — Telephone Encounter (Signed)
Pt advised.  He states he will wait to see how he feels in the morning and will call if he needs to come in.  Pt states right now he is not running a fever.   Thanks,   -Mickel Baas

## 2018-09-02 NOTE — Telephone Encounter (Signed)
Patient called office today with concerns of congestion, fatigue, cough and shortness of breath on exertion. Patient reports that he was seen for these matter on 08/17/18 by dr. Rosanna Randy and 08/25/18 by Simona Huh and reports that he has had bloodwork and chest x-ray but nothing has been found. Patient reports that he has concerns because his symptoms are not improving and is wanting advice on what to do next? Patient request call back from [place]. KW

## 2018-09-28 ENCOUNTER — Telehealth: Payer: Self-pay | Admitting: *Deleted

## 2018-09-28 NOTE — Telephone Encounter (Signed)
Advised patient as below. He reports that he did not want to get exposed to covid19. I reassured the patient that we are sending all high risk patients to the ER, and that if he feels that he is having a diverticulitis flare that Dr. Rosanna Randy would need to examine is belly. Patient reports that he will see if it will calm down on its own, and will call back to schedule appt if it worsens.

## 2018-09-28 NOTE — Telephone Encounter (Signed)
Patient states his diverticulitis has flared back up and he wanted to know if Dr. Rosanna Randy will send an antibiotic to the pharmacy for him. [facility]. Please advise?

## 2018-09-28 NOTE — Telephone Encounter (Signed)
For That I will need to examine his belly.

## 2018-09-28 NOTE — Telephone Encounter (Signed)
Please review. Thanks!  

## 2018-10-18 ENCOUNTER — Other Ambulatory Visit: Payer: Self-pay

## 2018-10-18 ENCOUNTER — Ambulatory Visit (INDEPENDENT_AMBULATORY_CARE_PROVIDER_SITE_OTHER): Payer: PPO | Admitting: Family Medicine

## 2018-10-18 ENCOUNTER — Encounter: Payer: Self-pay | Admitting: Family Medicine

## 2018-10-18 VITALS — BP 100/64 | HR 48 | Temp 98.2°F | Resp 16 | Wt 219.0 lb

## 2018-10-18 DIAGNOSIS — F32A Anxiety disorder, unspecified: Secondary | ICD-10-CM

## 2018-10-18 DIAGNOSIS — R1084 Generalized abdominal pain: Secondary | ICD-10-CM

## 2018-10-18 DIAGNOSIS — K5740 Diverticulitis of both small and large intestine with perforation and abscess without bleeding: Secondary | ICD-10-CM | POA: Diagnosis not present

## 2018-10-18 DIAGNOSIS — F419 Anxiety disorder, unspecified: Secondary | ICD-10-CM

## 2018-10-18 DIAGNOSIS — F324 Major depressive disorder, single episode, in partial remission: Secondary | ICD-10-CM | POA: Diagnosis not present

## 2018-10-18 DIAGNOSIS — M4802 Spinal stenosis, cervical region: Secondary | ICD-10-CM | POA: Diagnosis not present

## 2018-10-18 DIAGNOSIS — F329 Major depressive disorder, single episode, unspecified: Secondary | ICD-10-CM

## 2018-10-18 MED ORDER — SUCRALFATE 1 G PO TABS: 1.0000 g | ORAL_TABLET | Freq: Four times a day (QID) | ORAL | 0 refills | Status: DC

## 2018-10-18 MED ORDER — METRONIDAZOLE 500 MG PO TABS: 500.0000 mg | ORAL_TABLET | Freq: Three times a day (TID) | ORAL | 0 refills | Status: DC

## 2018-10-18 MED ORDER — CIPROFLOXACIN HCL 500 MG PO TABS: 500.0000 mg | ORAL_TABLET | Freq: Two times a day (BID) | ORAL | 0 refills | Status: DC

## 2018-10-18 MED ORDER — SERTRALINE HCL 100 MG PO TABS: 100.0000 mg | ORAL_TABLET | Freq: Every day | ORAL | 3 refills | Status: DC

## 2018-10-18 NOTE — Patient Instructions (Addendum)
1. Diverticulitis of both large and small intestine with perforation without bleeding Given rx for Cipro 500 mg bid, #14 and

## 2018-10-18 NOTE — Progress Notes (Signed)
Patient: Randy Dean Male    DOB: [birth year]   53 y.o.   MRN: [id number] Visit Date: 10/18/2018  Today's Provider: [name], MD   Chief Complaint  Patient presents with  . Diverticulitis   Subjective:     HPI   Diverticulitis of both large and small intestine with perforation without bleeding From 04/28/2018-Follow-up with surge and CT scan scheduled.  Patient has been having stomach and low back discomfort lately. Patient believes his diverticulitis has flare up. Patient also wants to discuss anxiety issues.  His low back issues improved somewhat as his diverticulitis was treated last fall.  The surgeon told him this could be related.  He now is having more abdominal discomfort and more low back pain.  He is also having some radicular pain recently.  The other issue that is somewhat worse with the patient has chronic anxiety.  This is worsened with the coronavirus pandemic.  Anxiety seems to be the main issue.  He has some increasing fatigue recently.  Mild depression but certainly no suicidal or homicidal ideation.  Allergies  Allergen Reactions  . Serzone [Nefazodone]     Tongue and facial swelling  . Codeine Nausea Only    Dizziness, sweating  . Doxycycline Other (See Comments)    Severe headaches  . Effexor [Venlafaxine]     "Feels out of it"  . Paxil [Paroxetine Hcl]     "does not feel right in the head"  . Esomeprazole Sodium Rash     Current Outpatient Medications:  .  acetaminophen (TYLENOL) 500 MG tablet, Take 500 mg by mouth every 6 (six) hours as needed for mild pain. , Disp: , Rfl:  .  albuterol (PROVENTIL HFA) 108 (90 Base) MCG/ACT inhaler, Inhale 1 puff into the lungs every 4 (four) hours as needed for wheezing or shortness of breath., Disp: 1 Inhaler, Rfl: 0 .  ALPRAZolam (XANAX) 0.5 MG tablet, Take 2 tablets approximately 45 minutes prior to the MRI study, take a third tablet if needed., Disp: 3 tablet, Rfl: 0 .  azithromycin  (ZITHROMAX) 250 MG tablet, Take 2 tablets on the first day, then take one tablet daily (Patient not taking: Reported on 08/25/2018), Disp: 6 tablet, Rfl: 0 .  dexlansoprazole (DEXILANT) 60 MG capsule, Take 1 capsule (60 mg total) by mouth daily., Disp: 30 capsule, Rfl: 11 .  diazepam (VALIUM) 5 MG tablet, Take 1 tablet (5 mg total) by mouth See admin instructions. 1 PO 1 hour prior to procedure, may repeat if needed, Disp: 3 tablet, Rfl: 0 .  fluticasone (FLONASE) 50 MCG/ACT nasal spray, Place 2 sprays into both nostrils 2 (two) times daily., Disp: 96 g, Rfl: 0 .  gabapentin (NEURONTIN) 100 MG capsule, Take 300 mg by mouth daily. , Disp: , Rfl:  .  loratadine (CLARITIN) 10 MG tablet, Take 10 mg by mouth daily., Disp: , Rfl:  .  Multiple Vitamin (MULTIVITAMIN) capsule, Take 1 capsule by mouth daily., Disp: , Rfl:  .  nystatin (MYCOSTATIN) 100000 UNIT/ML suspension, Take 5 mLs (500,000 Units total) by mouth 4 (four) times daily., Disp: 60 mL, Rfl: 0 .  oxyCODONE (OXY IR/ROXICODONE) 5 MG immediate release tablet, Take 1 tablet (5 mg total) by mouth every 6 (six) hours as needed for severe pain., Disp: 30 tablet, Rfl: 0 .  predniSONE (STERAPRED UNI-PAK 21 TAB) 10 MG (21) TBPK tablet, Taper as directed., Disp: 21 tablet, Rfl: 0 .  sertraline (ZOLOFT) 50 MG tablet,  Take 1 tablet (50 mg total) by mouth daily., Disp: 90 tablet, Rfl: 3  Review of Systems  Constitutional: Negative for appetite change, chills and fever.  HENT: Negative.   Eyes: Negative.   Respiratory: Negative for chest tightness, shortness of breath and wheezing.   Cardiovascular: Negative for chest pain and palpitations.  Gastrointestinal: Negative for abdominal pain, nausea and vomiting.  Endocrine: Negative.   Genitourinary: Negative.   Musculoskeletal: Positive for back pain.  Allergic/Immunologic: Negative.   Neurological: Positive for tremors and weakness.  Psychiatric/Behavioral: The patient is nervous/anxious.     Social  History   Tobacco Use  . Smoking status: Former Smoker    Packs/day: 0.50    Years: 3.00    Pack years: 1.50    Last attempt to quit: 06/28/1984    Years since quitting: 34.3  . Smokeless tobacco: Current User    Types: Snuff    Last attempt to quit: 06/29/1986  . Tobacco comment: 1 can dip/day  Substance Use Topics  . Alcohol use: No    Alcohol/week: 0.0 standard drinks      Objective:   BP 100/64 (BP Location: Right Arm, Patient Position: Sitting, Cuff Size: Large)   Pulse (!) 48   Temp 98.2 F (36.8 C) (Oral)   Resp 16   Wt 219 lb (99.3 kg)   SpO2 95%   BMI 29.70 kg/m  Vitals:   10/18/18 0925  BP: 100/64  Pulse: (!) 48  Resp: 16  Temp: 98.2 F (36.8 C)  TempSrc: Oral  SpO2: 95%  Weight: 219 lb (99.3 kg)     Physical Exam Vitals signs reviewed.  Constitutional:      Appearance: Normal appearance.  HENT:     Head: Normocephalic and atraumatic.     Right Ear: External ear normal.     Left Ear: External ear normal.     Nose: Nose normal.  Eyes:     General: No scleral icterus. Cardiovascular:     Rate and Rhythm: Normal rate and regular rhythm.     Heart sounds: Normal heart sounds.  Pulmonary:     Effort: Pulmonary effort is normal.     Breath sounds: Normal breath sounds.  Abdominal:     Palpations: Abdomen is soft.     Comments: Minimal tenderness without guarding or rebound.  Tenderness seems to be more in the left upper quadrant and epigastrium.  Musculoskeletal:     Right lower leg: No edema.     Left lower leg: No edema.  Lymphadenopathy:     Cervical: No cervical adenopathy.  Skin:    General: Skin is warm and dry.  Neurological:     Mental Status: He is alert. Mental status is at baseline.  Psychiatric:        Mood and Affect: Mood normal.        Behavior: Behavior normal.        Thought Content: Thought content normal.        Judgment: Judgment normal.         Assessment & Plan    1. Diverticulitis of both large and small  intestine with perforation without bleeding Treat presently with Cipro and Flagyl.  May need GI referral at some point in time.  Not practical right now in middle of coronavirus pandemic.  2. Anxiety and depression Increase sertraline from 50 to 100 mg daily  3. Generalized abdominal pain Add sucralfate 4 times daily to his regimen. - CBC w/Diff/Platelet - Comprehensive Metabolic  Panel (CMET) - Lipase  4. Spinal stenosis in cervical region   5. Major depressive disorder with single episode, in partial remission (HCC) Increase in sertraline.  Consider changing to duloxetine on next visit.  Return to clinic 1 to 2 months.  More than 50% of 30-minute visit is spent in counseling and coordination of care.  I,[name],acting as a scribe for [name], MD.,have documented all relevant documentation on the behalf of [name], MD,as directed by  [name], MD while in the presence of [name], MD.  I have done the exam and reviewed the above chart and it is accurate to the best of my knowledge. Development worker, community has been used in this note in any air is in the dictation or transcription are unintentional.  [name], MD  [facility]

## 2018-10-19 ENCOUNTER — Telehealth: Payer: Self-pay

## 2018-10-19 LAB — CBC WITH DIFFERENTIAL/PLATELET
Basophils Absolute: 0 10*3/uL (ref 0.0–0.2)
Basos: 1 %
EOS (ABSOLUTE): 0.1 10*3/uL (ref 0.0–0.4)
Eos: 1 %
Hematocrit: 44.5 % (ref 37.5–51.0)
Hemoglobin: 15.6 g/dL (ref 13.0–17.7)
Immature Grans (Abs): 0 10*3/uL (ref 0.0–0.1)
Immature Granulocytes: 0 %
Lymphocytes Absolute: 1.5 10*3/uL (ref 0.7–3.1)
Lymphs: 24 %
MCH: 31.2 pg (ref 26.6–33.0)
MCHC: 35.1 g/dL (ref 31.5–35.7)
MCV: 89 fL (ref 79–97)
Monocytes Absolute: 0.4 10*3/uL (ref 0.1–0.9)
Monocytes: 7 %
Neutrophils Absolute: 4 10*3/uL (ref 1.4–7.0)
Neutrophils: 67 %
Platelets: 192 10*3/uL (ref 150–450)
RBC: 5 x10E6/uL (ref 4.14–5.80)
RDW: 13 % (ref 11.6–15.4)
WBC: 6 10*3/uL (ref 3.4–10.8)

## 2018-10-19 LAB — COMPREHENSIVE METABOLIC PANEL
ALT: 23 IU/L (ref 0–44)
AST: 20 IU/L (ref 0–40)
Albumin/Globulin Ratio: 2 (ref 1.2–2.2)
Albumin: 4.3 g/dL (ref 3.8–4.9)
Alkaline Phosphatase: 74 IU/L (ref 39–117)
BUN/Creatinine Ratio: 7 — ABNORMAL LOW (ref 9–20)
BUN: 13 mg/dL (ref 6–24)
Bilirubin Total: 0.4 mg/dL (ref 0.0–1.2)
CO2: 24 mmol/L (ref 20–29)
Calcium: 9.2 mg/dL (ref 8.7–10.2)
Chloride: 104 mmol/L (ref 96–106)
Creatinine, Ser: 1.77 mg/dL — ABNORMAL HIGH (ref 0.76–1.27)
GFR calc Af Amer: 49 mL/min/{1.73_m2} — ABNORMAL LOW (ref >59)
GFR calc non Af Amer: 43 mL/min/{1.73_m2} — ABNORMAL LOW (ref >59)
Globulin, Total: 2.2 g/dL (ref 1.5–4.5)
Glucose: 81 mg/dL (ref 65–99)
Potassium: 4.6 mmol/L (ref 3.5–5.2)
Sodium: 141 mmol/L (ref 134–144)
Total Protein: 6.5 g/dL (ref 6.0–8.5)

## 2018-10-19 LAB — LIPASE: Lipase: 55 U/L (ref 13–78)

## 2018-10-19 NOTE — Telephone Encounter (Signed)
Mr. Markey was returning your call.

## 2018-10-20 NOTE — Telephone Encounter (Signed)
Left vm advising patient that he can take tylenol 500 mg as needed per gilbert.  dbs

## 2018-10-20 NOTE — Telephone Encounter (Signed)
Patient advised of his labs results. Notes recorded by Jerrol Banana., MD on 10/19/2018 at 2:19 PM EDT Labs stable--to avoid further kidney damage avoid Meloxicam/Ibuprofen/all NSAIDs.push fluids.  Patient is asking what else can he take since he is not able to take Meloxicam.

## 2018-10-31 ENCOUNTER — Telehealth: Payer: Self-pay | Admitting: Family Medicine

## 2018-10-31 MED ORDER — FLUCONAZOLE 100 MG PO TABS: 100.0000 mg | ORAL_TABLET | Freq: Once | ORAL | 0 refills | Status: AC

## 2018-10-31 NOTE — Telephone Encounter (Signed)
pt Called saying he has thrush in his mouth due to antibiotics that he has been taking  He would like diflucan sent to the [facility]  CB#  [phone]  Thanks Con Memos

## 2018-10-31 NOTE — Telephone Encounter (Signed)
Diflucan 100mg  q day times 2.

## 2018-10-31 NOTE — Telephone Encounter (Signed)
Medication was sent into the pharmacy. Patient was advised.  

## 2018-10-31 NOTE — Telephone Encounter (Signed)
Please advise 

## 2018-11-17 DIAGNOSIS — Z03818 Encounter for observation for suspected exposure to other biological agents ruled out: Secondary | ICD-10-CM | POA: Diagnosis not present

## 2018-12-19 ENCOUNTER — Other Ambulatory Visit: Payer: Self-pay

## 2018-12-19 ENCOUNTER — Encounter: Payer: Self-pay | Admitting: Family Medicine

## 2018-12-19 ENCOUNTER — Ambulatory Visit (INDEPENDENT_AMBULATORY_CARE_PROVIDER_SITE_OTHER): Payer: PPO | Admitting: Family Medicine

## 2018-12-19 VITALS — BP 124/72 | HR 69 | Temp 98.8°F | Wt 218.2 lb

## 2018-12-19 DIAGNOSIS — M791 Myalgia, unspecified site: Secondary | ICD-10-CM | POA: Diagnosis not present

## 2018-12-19 DIAGNOSIS — K5740 Diverticulitis of both small and large intestine with perforation and abscess without bleeding: Secondary | ICD-10-CM | POA: Diagnosis not present

## 2018-12-19 DIAGNOSIS — F324 Major depressive disorder, single episode, in partial remission: Secondary | ICD-10-CM | POA: Diagnosis not present

## 2018-12-19 DIAGNOSIS — S069X0S Unspecified intracranial injury without loss of consciousness, sequela: Secondary | ICD-10-CM | POA: Diagnosis not present

## 2018-12-19 DIAGNOSIS — K219 Gastro-esophageal reflux disease without esophagitis: Secondary | ICD-10-CM

## 2018-12-19 MED ORDER — MAGNESIUM OXIDE 400 (241.3 MG) MG PO TABS: 400.0000 mg | ORAL_TABLET | Freq: Two times a day (BID) | ORAL | 5 refills | Status: DC

## 2018-12-19 NOTE — Progress Notes (Signed)
Patient: Randy Dean Male    DOB: Aug 31, 1965   53 y.o.   MRN: [id number] Visit Date: 12/19/2018  Today's Provider: [name], MD   Chief Complaint  Patient presents with  . Diverticulitis  . Anxiety  . Depression   Subjective:    HPI  Diverticulitis of both large and small intestine with perforation without bleeding Patient presents today for 2 month follow-up for diverticulitis. Patient has been having abdominal (lower left side only per pt.) and low back discomfort with some radicular pain.Patient has been taking Cipro and Flagyl and was mention at last visit patient may need GI referral at some point in time. Patient states that he stopped the Cipro due to it causing body aches.   Anxiety/Depression Patient presents today for anxiety follow-up. Patient stated at last visit his anxiety worsened due to the coronavirus pandemic. He has increase fatigue with mild depression. Patient is currently taking Sertraline for his anxiety and at last visit it was increased Sertraline from 50 to 100 mg daily.   Patient is having lower abdominal pain that radiates  to his lower back.   Allergies  Allergen Reactions  . Serzone [Nefazodone]     Tongue and facial swelling  . Codeine Nausea Only    Dizziness, sweating  . Doxycycline Other (See Comments)    Severe headaches  . Effexor [Venlafaxine]     "Feels out of it"  . Paxil [Paroxetine Hcl]     "does not feel right in the head"  . Esomeprazole Sodium Rash     Current Outpatient Medications:  .  acetaminophen (TYLENOL) 500 MG tablet, Take 500 mg by mouth every 6 (six) hours as needed for mild pain. , Disp: , Rfl:  .  albuterol (PROVENTIL HFA) 108 (90 Base) MCG/ACT inhaler, Inhale 1 puff into the lungs every 4 (four) hours as needed for wheezing or shortness of breath., Disp: 1 Inhaler, Rfl: 0 .  ALPRAZolam (XANAX) 0.5 MG tablet, Take 2 tablets approximately 45 minutes prior to the MRI study, take a third tablet  if needed., Disp: 3 tablet, Rfl: 0 .  dexlansoprazole (DEXILANT) 60 MG capsule, Take 1 capsule (60 mg total) by mouth daily., Disp: 30 capsule, Rfl: 11 .  diazepam (VALIUM) 5 MG tablet, Take 1 tablet (5 mg total) by mouth See admin instructions. 1 PO 1 hour prior to procedure, may repeat if needed, Disp: 3 tablet, Rfl: 0 .  fluticasone (FLONASE) 50 MCG/ACT nasal spray, Place 2 sprays into both nostrils 2 (two) times daily., Disp: 96 g, Rfl: 0 .  gabapentin (NEURONTIN) 100 MG capsule, Take 300 mg by mouth daily. , Disp: , Rfl:  .  loratadine (CLARITIN) 10 MG tablet, Take 10 mg by mouth daily., Disp: , Rfl:  .  Multiple Vitamin (MULTIVITAMIN) capsule, Take 1 capsule by mouth daily., Disp: , Rfl:  .  nystatin (MYCOSTATIN) 100000 UNIT/ML suspension, Take 5 mLs (500,000 Units total) by mouth 4 (four) times daily., Disp: 60 mL, Rfl: 0 .  oxyCODONE (OXY IR/ROXICODONE) 5 MG immediate release tablet, Take 1 tablet (5 mg total) by mouth every 6 (six) hours as needed for severe pain., Disp: 30 tablet, Rfl: 0 .  predniSONE (STERAPRED UNI-PAK 21 TAB) 10 MG (21) TBPK tablet, Taper as directed., Disp: 21 tablet, Rfl: 0 .  sertraline (ZOLOFT) 100 MG tablet, Take 1 tablet (100 mg total) by mouth daily., Disp: 90 tablet, Rfl: 3 .  sucralfate (CARAFATE) 1 g tablet,  Take 1 tablet (1 g total) by mouth 4 (four) times daily. Take 1 tablet with each meal and 1 tablet at bedtime, Disp: 180 tablet, Rfl: 0 .  azithromycin (ZITHROMAX) 250 MG tablet, Take 2 tablets on the first day, then take one tablet daily (Patient not taking: Reported on 08/25/2018), Disp: 6 tablet, Rfl: 0 .  ciprofloxacin (CIPRO) 500 MG tablet, Take 1 tablet (500 mg total) by mouth 2 (two) times daily. (Patient not taking: Reported on 12/19/2018), Disp: 14 tablet, Rfl: 0 .  metroNIDAZOLE (FLAGYL) 500 MG tablet, Take 1 tablet (500 mg total) by mouth 3 (three) times daily. (Patient not taking: Reported on 12/19/2018), Disp: 21 tablet, Rfl: 0  Review of Systems   Constitutional: Positive for fatigue.  Respiratory: Negative.   Gastrointestinal: Positive for abdominal pain (lower left).  Genitourinary: Negative.   Musculoskeletal: Positive for back pain (lower back).  Neurological: Negative.   Psychiatric/Behavioral: The patient is nervous/anxious.        He does not like having to wear the mask for covid and feels anything tight on him makes him feel very agitated.    Social History   Tobacco Use  . Smoking status: Former Smoker    Packs/day: 0.50    Years: 3.00    Pack years: 1.50    Quit date: 06/28/1984    Years since quitting: 34.4  . Smokeless tobacco: Current User    Types: Snuff    Last attempt to quit: 06/29/1986  . Tobacco comment: 1 can dip/day  Substance Use Topics  . Alcohol use: No    Alcohol/week: 0.0 standard drinks      Objective:   BP 124/72 (BP Location: Left Arm, Patient Position: Sitting, Cuff Size: Normal)   Pulse 69   Temp 98.8 F (37.1 C) (Oral)   Wt 218 lb 3.2 oz (99 kg)   SpO2 96%   BMI 29.59 kg/m  Vitals:   12/19/18 0953  BP: 124/72  Pulse: 69  Temp: 98.8 F (37.1 C)  TempSrc: Oral  SpO2: 96%  Weight: 218 lb 3.2 oz (99 kg)     Physical Exam Vitals signs reviewed.  Constitutional:      Appearance: Normal appearance.  HENT:     Head: Normocephalic and atraumatic.     Right Ear: External ear normal.     Left Ear: External ear normal.     Nose: Nose normal.  Eyes:     General: No scleral icterus. Cardiovascular:     Rate and Rhythm: Normal rate and regular rhythm.     Heart sounds: Normal heart sounds.  Pulmonary:     Effort: Pulmonary effort is normal.     Breath sounds: Normal breath sounds.  Abdominal:     Palpations: Abdomen is soft.     Comments: Minimal tenderness without guarding or rebound.  Tenderness seems to be more in the LLQ most followed by RLQ.  Musculoskeletal:     Right lower leg: No edema.     Left lower leg: No edema.  Lymphadenopathy:     Cervical: No cervical  adenopathy.  Skin:    General: Skin is warm and dry.  Neurological:     Mental Status: He is alert. Mental status is at baseline.     Gait: Gait abnormal.     Comments: Chronic unsteady gait.  Psychiatric:        Mood and Affect: Mood normal.        Behavior: Behavior normal.  Thought Content: Thought content normal.        Judgment: Judgment normal.         Assessment & Plan     .1. Diverticulitis of both large and small intestine with perforation without bleeding I Think he does have a mild case of diverticulitis.  Continue present antibiotics and refer to GI for consideration of colonoscopy. Obtain lab work but I do not think he needs CT scan today.  Not tender at all compared to last visit we had to admit him. - CK - Sedimentation rate - Ambulatory referral to Gastroenterology  2. Major depressive disorder with single episode, in partial remission ([place]) Chronic depression which is stable and in partial remission with ongoing anxiety issues.  3. Gastroesophageal reflux disease, esophagitis presence not specified  - CBC with Differential/Platelet - Comprehensive metabolic panel  4. Myalgia  - magnesium oxide (MAG-OX) 400 (241.3 Mg) MG tablet; Take 1 tablet (400 mg total) by mouth 2 (two) times daily.  Dispense: 60 tablet; Refill: 5 5.Old TBI   I, Porsha McClurkin CMA, am acting as a Education administrator for [name]., MD.       [name], MD  [facility]

## 2018-12-20 ENCOUNTER — Telehealth: Payer: Self-pay | Admitting: Family Medicine

## 2018-12-20 LAB — CBC WITH DIFFERENTIAL/PLATELET
Basophils Absolute: 0 10*3/uL (ref 0.0–0.2)
Basos: 1 %
EOS (ABSOLUTE): 0.1 10*3/uL (ref 0.0–0.4)
Eos: 1 %
Hematocrit: 45 % (ref 37.5–51.0)
Hemoglobin: 15.5 g/dL (ref 13.0–17.7)
Immature Grans (Abs): 0 10*3/uL (ref 0.0–0.1)
Immature Granulocytes: 0 %
Lymphocytes Absolute: 1.5 10*3/uL (ref 0.7–3.1)
Lymphs: 28 %
MCH: 30.6 pg (ref 26.6–33.0)
MCHC: 34.4 g/dL (ref 31.5–35.7)
MCV: 89 fL (ref 79–97)
Monocytes Absolute: 0.5 10*3/uL (ref 0.1–0.9)
Monocytes: 10 %
Neutrophils Absolute: 3.1 10*3/uL (ref 1.4–7.0)
Neutrophils: 60 %
Platelets: 201 10*3/uL (ref 150–450)
RBC: 5.06 x10E6/uL (ref 4.14–5.80)
RDW: 12.6 % (ref 11.6–15.4)
WBC: 5.2 10*3/uL (ref 3.4–10.8)

## 2018-12-20 LAB — COMPREHENSIVE METABOLIC PANEL
ALT: 21 IU/L (ref 0–44)
AST: 23 IU/L (ref 0–40)
Albumin/Globulin Ratio: 1.7 (ref 1.2–2.2)
Albumin: 4.1 g/dL (ref 3.8–4.9)
Alkaline Phosphatase: 71 IU/L (ref 39–117)
BUN/Creatinine Ratio: 9 (ref 9–20)
BUN: 16 mg/dL (ref 6–24)
Bilirubin Total: 0.6 mg/dL (ref 0.0–1.2)
CO2: 23 mmol/L (ref 20–29)
Calcium: 9.4 mg/dL (ref 8.7–10.2)
Chloride: 105 mmol/L (ref 96–106)
Creatinine, Ser: 1.78 mg/dL — ABNORMAL HIGH (ref 0.76–1.27)
GFR calc Af Amer: 49 mL/min/{1.73_m2} — ABNORMAL LOW (ref >59)
GFR calc non Af Amer: 42 mL/min/{1.73_m2} — ABNORMAL LOW (ref >59)
Globulin, Total: 2.4 g/dL (ref 1.5–4.5)
Glucose: 90 mg/dL (ref 65–99)
Potassium: 4.7 mmol/L (ref 3.5–5.2)
Sodium: 142 mmol/L (ref 134–144)
Total Protein: 6.5 g/dL (ref 6.0–8.5)

## 2018-12-20 LAB — SEDIMENTATION RATE: Sed Rate: 19 mm/hr (ref 0–30)

## 2018-12-20 LAB — CK: Total CK: 474 U/L — ABNORMAL HIGH (ref 41–331)

## 2018-12-20 NOTE — Chronic Care Management (AMB) (Signed)
Chronic Care Management   Note  12/20/2018 Name: Randy Dean MRN: [id number] DOB: 12-09-1965  Randy Dean is a 53 y.o. year old male who is a primary care patient of Jerrol Banana., MD. I reached out to Randy Dean by phone today in response to a referral sent by Randy Dean's health plan.    [name] was given information about Chronic Care Management services today including:  1. CCM service includes personalized support from designated clinical staff supervised by his physician, including individualized plan of care and coordination with other care providers 2. 24/7 contact phone numbers for assistance for urgent and routine care needs. 3. Service will only be billed when office clinical staff spend 20 minutes or more in a month to coordinate care. 4. Only one practitioner may furnish and bill the service in a calendar month. 5. The patient may stop CCM services at any time (effective at the end of the month) by phone call to the office staff. 6. The patient will be responsible for cost sharing (co-pay) of up to 20% of the service fee (after annual deductible is met).  Patient did not agree to enrollment in care management services and does not wish to consider at this time.  Follow up plan: The patient has been provided with contact information for the chronic care management team and has been advised to call with any health related questions or concerns.   [place]  [email or web address]   ??[phone]

## 2018-12-21 ENCOUNTER — Encounter: Payer: Self-pay | Admitting: Family Medicine

## 2018-12-23 ENCOUNTER — Encounter: Payer: Self-pay | Admitting: Family Medicine

## 2018-12-27 ENCOUNTER — Telehealth: Payer: Self-pay

## 2018-12-27 ENCOUNTER — Telehealth: Payer: Self-pay | Admitting: *Deleted

## 2018-12-27 NOTE — Telephone Encounter (Signed)
Spoke with pt--sevral issues. Has GI appt for abd/diverticulitis. Says he has progressive DOE with no chest pain. Describes he gets tired easily and his joints throb. Discussed I would be happy to see him or refer him to cardiology for DOE or rhematology for arthralgias And myalgias with mildly elevated CK. Pt will let me know if he wants to do anything after his GI appt.  No call needed.

## 2018-12-27 NOTE — Telephone Encounter (Signed)
Patient calling that he would like his referral to go to [facility]. [address]

## 2018-12-27 NOTE — Telephone Encounter (Signed)
Patient states that he is concerned about his  CK levels. He explained that he is making an appointment with Gastroenterology @ [place].

## 2018-12-27 NOTE — Telephone Encounter (Signed)
Patient called office concerning his lab results. Patient has concerns bout the ck levels and would like to speak to someone about his concerns. Please advise?

## 2018-12-28 ENCOUNTER — Other Ambulatory Visit: Payer: Self-pay

## 2018-12-28 ENCOUNTER — Telehealth: Payer: Self-pay | Admitting: Family Medicine

## 2018-12-28 DIAGNOSIS — K5740 Diverticulitis of both small and large intestine with perforation and abscess without bleeding: Secondary | ICD-10-CM

## 2018-12-28 NOTE — Telephone Encounter (Signed)
Patient called this morning to say he changed his mind about going to [facility].  Would like to go to the GI of Dr Alben Spittle choice

## 2018-12-28 NOTE — Telephone Encounter (Signed)
Pt called to let Dr. Rosanna Randy know he does not want to go with any [place] provider/location for his GI referral.  He wants to go with [facility].  Phone:  [phone]  Fax Referral and medical records regarding pt's GI issues to: [phone]  Thanks, [facility]

## 2018-12-28 NOTE — Telephone Encounter (Signed)
Ok to do referral?  

## 2018-12-28 NOTE — Telephone Encounter (Signed)
[facility]--Dr Lynnell Jude group

## 2018-12-28 NOTE — Telephone Encounter (Signed)
Please advise 

## 2018-12-29 ENCOUNTER — Other Ambulatory Visit: Payer: Self-pay

## 2018-12-29 DIAGNOSIS — K5740 Diverticulitis of both small and large intestine with perforation and abscess without bleeding: Secondary | ICD-10-CM

## 2019-01-13 DIAGNOSIS — K5792 Diverticulitis of intestine, part unspecified, without perforation or abscess without bleeding: Secondary | ICD-10-CM | POA: Diagnosis not present

## 2019-02-01 ENCOUNTER — Other Ambulatory Visit: Payer: Self-pay | Admitting: Surgery

## 2019-02-01 DIAGNOSIS — R1032 Left lower quadrant pain: Secondary | ICD-10-CM | POA: Diagnosis not present

## 2019-02-01 DIAGNOSIS — R1030 Lower abdominal pain, unspecified: Secondary | ICD-10-CM | POA: Diagnosis not present

## 2019-02-07 ENCOUNTER — Ambulatory Visit
Admission: RE | Admit: 2019-02-07 | Discharge: 2019-02-07 | Disposition: A | Discharge status: Home / Self Care | Payer: PPO | Source: Ambulatory Visit | Attending: Surgery | Admitting: Surgery

## 2019-02-07 ENCOUNTER — Other Ambulatory Visit: Payer: Self-pay

## 2019-02-07 DIAGNOSIS — K5730 Diverticulosis of large intestine without perforation or abscess without bleeding: Secondary | ICD-10-CM | POA: Diagnosis not present

## 2019-02-07 DIAGNOSIS — N281 Cyst of kidney, acquired: Secondary | ICD-10-CM | POA: Diagnosis not present

## 2019-02-07 DIAGNOSIS — R1032 Left lower quadrant pain: Secondary | ICD-10-CM | POA: Diagnosis not present

## 2019-02-27 ENCOUNTER — Other Ambulatory Visit
Admission: RE | Admit: 2019-02-27 | Discharge: 2019-02-27 | Disposition: A | Discharge status: Home / Self Care | Payer: PPO | Source: Ambulatory Visit | Attending: Surgery | Admitting: Surgery

## 2019-02-27 ENCOUNTER — Other Ambulatory Visit: Payer: Self-pay

## 2019-02-27 DIAGNOSIS — K649 Unspecified hemorrhoids: Secondary | ICD-10-CM | POA: Insufficient documentation

## 2019-02-27 DIAGNOSIS — Z20828 Contact with and (suspected) exposure to other viral communicable diseases: Secondary | ICD-10-CM | POA: Insufficient documentation

## 2019-02-27 DIAGNOSIS — K635 Polyp of colon: Secondary | ICD-10-CM | POA: Diagnosis not present

## 2019-02-27 DIAGNOSIS — K621 Rectal polyp: Secondary | ICD-10-CM | POA: Insufficient documentation

## 2019-02-27 DIAGNOSIS — K5790 Diverticulosis of intestine, part unspecified, without perforation or abscess without bleeding: Secondary | ICD-10-CM | POA: Diagnosis not present

## 2019-02-27 DIAGNOSIS — Z01812 Encounter for preprocedural laboratory examination: Secondary | ICD-10-CM | POA: Insufficient documentation

## 2019-02-27 NOTE — H&P (Signed)
Subjective:   CC: Lower abdominal pain [R10.30]   HPI:  Randy Dean is a 53 y.o. male who is here for followup from above.  57mo of intermittent lower abdominal pain again, after initial episode 76mo ago.  Fluctuates in severity 6-10/10 pain, not taking any pain meds, worse right before BM.       Current Medications: has a current medication list which includes the following prescription(s): aspirin, epinephrine, fluticasone propionate, gabapentin, loratadine, meclizine, multivitamin, pseudoephedrine, sertraline, sucralfate, and dexlansoprazole.  Allergies:       Allergies  Allergen Reactions  . Iodinated Contrast Media Other (See Comments)    Kidney damage  . Codeine Nausea    Dizziness, sweating  . Doxycycline Other (See Comments)    Severe headaches Severe headaches   . Nefazodone Swelling    Tongue and facial swelling  . Nexium Iv [Esomeprazole Sodium] Rash  . Paroxetine Hcl Other (See Comments)    "does not feel right in the head"  . Venlafaxine Other (See Comments)    "Feels out of it"    ROS: A 15 point review of systems was performed and pertinent positives and negatives noted in HPI   Objective:     BP 131/72   Pulse 81   Ht 180.3 cm (5\' 11" )   Wt 94.3 kg (208 lb)   BMI 29.01 kg/m  afebrile  Constitutional :  alert, appears stated age, cooperative and no distress  Gastrointestinal: Soft, no guarding, but has persistent complaint of tenderness along the suprapubic region and left lower quadrant her previous diverticulitis was noted..    Musculoskeletal: Steady gait and movement  Skin: Cool and moist    Psychiatric: Normal affect, non-agitated, not confused       LABS:  N/A   RADS: N/A  Assessment:      Lower abdominal pain [R10.30]  Plan:     Diverticulitis K57.20-  Current symptoms may indicate recurrent infection/inflammation.  Will repeat bloodwork (CBC, CMP) and CT abdomen/pelvis with PO contrast only (hx of  kidney injury) to assess degree of possible inflammation.    UPDATE: CT and labs unremarkable, will proceed with colonoscopy to further investigate.  Risks include bleeding, perforation.  Benefits include further diagnosis.  Alternatives include continued observation.

## 2019-02-28 LAB — SARS CORONAVIRUS 2 (TAT 6-24 HRS): SARS Coronavirus 2: NEGATIVE

## 2019-03-02 ENCOUNTER — Encounter: Payer: Self-pay | Admitting: Anesthesiology

## 2019-03-02 ENCOUNTER — Ambulatory Visit
Admission: RE | Admit: 2019-03-02 | Discharge: 2019-03-02 | Disposition: A | Discharge status: Home / Self Care | Payer: PPO | Source: Ambulatory Visit | Attending: Surgery | Admitting: Surgery

## 2019-03-02 ENCOUNTER — Encounter
Admission: RE | Disposition: A | Discharge status: Home / Self Care | Payer: Self-pay | Source: Ambulatory Visit | Attending: Surgery

## 2019-03-02 ENCOUNTER — Ambulatory Visit: Payer: PPO | Admitting: Anesthesiology

## 2019-03-02 DIAGNOSIS — Z7982 Long term (current) use of aspirin: Secondary | ICD-10-CM | POA: Insufficient documentation

## 2019-03-02 DIAGNOSIS — R42 Dizziness and giddiness: Secondary | ICD-10-CM | POA: Diagnosis not present

## 2019-03-02 DIAGNOSIS — I69351 Hemiplegia and hemiparesis following cerebral infarction affecting right dominant side: Secondary | ICD-10-CM | POA: Insufficient documentation

## 2019-03-02 DIAGNOSIS — Z885 Allergy status to narcotic agent status: Secondary | ICD-10-CM | POA: Diagnosis not present

## 2019-03-02 DIAGNOSIS — Z79899 Other long term (current) drug therapy: Secondary | ICD-10-CM | POA: Diagnosis not present

## 2019-03-02 DIAGNOSIS — F4310 Post-traumatic stress disorder, unspecified: Secondary | ICD-10-CM | POA: Insufficient documentation

## 2019-03-02 DIAGNOSIS — K5730 Diverticulosis of large intestine without perforation or abscess without bleeding: Secondary | ICD-10-CM | POA: Diagnosis not present

## 2019-03-02 DIAGNOSIS — Z888 Allergy status to other drugs, medicaments and biological substances status: Secondary | ICD-10-CM | POA: Insufficient documentation

## 2019-03-02 DIAGNOSIS — K644 Residual hemorrhoidal skin tags: Secondary | ICD-10-CM | POA: Insufficient documentation

## 2019-03-02 DIAGNOSIS — D126 Benign neoplasm of colon, unspecified: Secondary | ICD-10-CM | POA: Diagnosis not present

## 2019-03-02 DIAGNOSIS — K219 Gastro-esophageal reflux disease without esophagitis: Secondary | ICD-10-CM | POA: Insufficient documentation

## 2019-03-02 DIAGNOSIS — D125 Benign neoplasm of sigmoid colon: Secondary | ICD-10-CM | POA: Insufficient documentation

## 2019-03-02 DIAGNOSIS — R7303 Prediabetes: Secondary | ICD-10-CM | POA: Diagnosis not present

## 2019-03-02 DIAGNOSIS — Z881 Allergy status to other antibiotic agents status: Secondary | ICD-10-CM | POA: Insufficient documentation

## 2019-03-02 DIAGNOSIS — Z87891 Personal history of nicotine dependence: Secondary | ICD-10-CM | POA: Insufficient documentation

## 2019-03-02 DIAGNOSIS — K649 Unspecified hemorrhoids: Secondary | ICD-10-CM | POA: Diagnosis not present

## 2019-03-02 DIAGNOSIS — D128 Benign neoplasm of rectum: Secondary | ICD-10-CM | POA: Diagnosis not present

## 2019-03-02 DIAGNOSIS — F418 Other specified anxiety disorders: Secondary | ICD-10-CM | POA: Diagnosis not present

## 2019-03-02 DIAGNOSIS — Z87820 Personal history of traumatic brain injury: Secondary | ICD-10-CM | POA: Diagnosis not present

## 2019-03-02 DIAGNOSIS — Z91041 Radiographic dye allergy status: Secondary | ICD-10-CM | POA: Diagnosis not present

## 2019-03-02 DIAGNOSIS — K621 Rectal polyp: Secondary | ICD-10-CM | POA: Insufficient documentation

## 2019-03-02 DIAGNOSIS — E785 Hyperlipidemia, unspecified: Secondary | ICD-10-CM | POA: Diagnosis not present

## 2019-03-02 DIAGNOSIS — R51 Headache: Secondary | ICD-10-CM | POA: Insufficient documentation

## 2019-03-02 DIAGNOSIS — K5790 Diverticulosis of intestine, part unspecified, without perforation or abscess without bleeding: Secondary | ICD-10-CM | POA: Diagnosis not present

## 2019-03-02 DIAGNOSIS — F329 Major depressive disorder, single episode, unspecified: Secondary | ICD-10-CM | POA: Diagnosis not present

## 2019-03-02 DIAGNOSIS — K640 First degree hemorrhoids: Secondary | ICD-10-CM | POA: Insufficient documentation

## 2019-03-02 DIAGNOSIS — F419 Anxiety disorder, unspecified: Secondary | ICD-10-CM | POA: Insufficient documentation

## 2019-03-02 HISTORY — PX: COLONOSCOPY WITH PROPOFOL: SHX5780

## 2019-03-02 SURGERY — COLONOSCOPY WITH PROPOFOL
Anesthesia: General

## 2019-03-02 MED ORDER — PROPOFOL 500 MG/50ML IV EMUL
INTRAVENOUS | Status: AC
  Filled 2019-03-02: qty 50

## 2019-03-02 MED ORDER — MIDAZOLAM HCL 2 MG/2ML IJ SOLN
INTRAMUSCULAR | Status: AC
  Filled 2019-03-02: qty 2

## 2019-03-02 MED ORDER — FENTANYL CITRATE (PF) 100 MCG/2ML IJ SOLN
INTRAMUSCULAR | Status: DC | PRN
  Administered 2019-03-02 (×2): 50 ug via INTRAVENOUS

## 2019-03-02 MED ORDER — FENTANYL CITRATE (PF) 100 MCG/2ML IJ SOLN
INTRAMUSCULAR | Status: AC
  Filled 2019-03-02: qty 2

## 2019-03-02 MED ORDER — LIDOCAINE HCL (CARDIAC) PF 100 MG/5ML IV SOSY
PREFILLED_SYRINGE | INTRAVENOUS | Status: DC | PRN
  Administered 2019-03-02: 30 mg via INTRAVENOUS

## 2019-03-02 MED ORDER — MIDAZOLAM HCL 2 MG/2ML IJ SOLN
INTRAMUSCULAR | Status: DC | PRN
  Administered 2019-03-02: 2 mg via INTRAVENOUS

## 2019-03-02 MED ORDER — PROPOFOL 500 MG/50ML IV EMUL
INTRAVENOUS | Status: DC | PRN
  Administered 2019-03-02: 120 ug/kg/min via INTRAVENOUS

## 2019-03-02 MED ORDER — SODIUM CHLORIDE 0.9 % IV SOLN
INTRAVENOUS | Status: DC
  Administered 2019-03-02: 1000 mL via INTRAVENOUS

## 2019-03-02 NOTE — Interval H&P Note (Signed)
History and Physical Interval Note:  03/02/2019 12:55 PM  Randy Dean  has presented today for surgery, with the diagnosis of CHRONIC DIVERTICULAR DISEASE.  The various methods of treatment have been discussed with the patient and family. After consideration of risks, benefits and other options for treatment, the patient has consented to  Procedure(s): COLONOSCOPY WITH PROPOFOL (N/A) as a surgical intervention.  The patient's history has been reviewed, patient examined, no change in status, stable for surgery.  I have reviewed the patient's chart and labs.  Questions were answered to the patient's satisfaction.     Dracen Reigle Lysle Pearl

## 2019-03-02 NOTE — Anesthesia Procedure Notes (Signed)
Performed by: Cook-Martin, Swayze Kozuch Pre-anesthesia Checklist: Patient identified, Emergency Drugs available, Suction available, Patient being monitored and Timeout performed Oxygen Delivery Method: Nasal cannula Preoxygenation: Pre-oxygenation with 100% oxygen Induction Type: IV induction Placement Confirmation: positive ETCO2 and CO2 detector       

## 2019-03-02 NOTE — Anesthesia Post-op Follow-up Note (Signed)
Anesthesia QCDR form completed.        

## 2019-03-02 NOTE — Transfer of Care (Signed)
Immediate Anesthesia Transfer of Care Note  Patient: Randy Dean  Procedure(s) Performed: COLONOSCOPY WITH PROPOFOL (N/A )  Patient Location: PACU  Anesthesia Type:General  Level of Consciousness: awake and sedated  Airway & Oxygen Therapy: Patient Spontanous Breathing and Patient connected to nasal cannula oxygen  Post-op Assessment: Report given to RN and Post -op Vital signs reviewed and stable  Post vital signs: Reviewed and stable  Last Vitals:  Vitals Value Taken Time  BP    Temp    Pulse    Resp    SpO2      Last Pain:  Vitals:   03/02/19 1231  TempSrc: Tympanic  PainSc: 4       Patients Stated Pain Goal: 0 (24/58/09 9833)  Complications: No apparent anesthesia complications

## 2019-03-02 NOTE — Op Note (Signed)
[facility] Patient Name: Randy Dean Procedure Date: 03/02/2019 12:56 PM MRN: [id number] Account #: [id number] Date of Birth: 1966/05/07 Admit Type: Outpatient Age: 53 Room: [facility] ENDO ROOM 1 Gender: Male Note Status: Finalized Procedure:            Colonoscopy Indications:          Diverticulosis of the colon Providers:            Eliseo Squires MD, MD Referring MD:         Janine Ores. Rosanna Randy, MD (Referring MD) Medicines:            Propofol per Anesthesia Complications:        No immediate complications. Procedure:            Pre-Anesthesia Assessment:                       - After reviewing the risks and benefits, the patient                        was deemed in satisfactory condition to undergo the                        procedure in an ambulatory setting.                       After obtaining informed consent, the colonoscope was                        passed under direct vision. Throughout the procedure,                        the patient's blood pressure, pulse, and oxygen                        saturations were monitored continuously. The                        Colonoscope was introduced through the anus and                        advanced to the the cecum, identified by appendiceal                        orifice and ileocecal valve. The colonoscopy was                        performed with ease. The patient tolerated the                        procedure well. The quality of the bowel preparation                        was good. Scope insertion time was 5 minutes. Scope                        withdrawal time was 20 minutes. Findings:      Skin tags were found on perianal exam.      Multiple small and large-mouthed diverticula were found in the sigmoid       colon and cecum.      A 3  mm polyp was found in the distal sigmoid colon. The polyp was       sessile. Biopsies were taken with a cold forceps for histology.      Two sessile polyps  were found in the rectum. The polyps were 2 to 4 mm       in size. These polyps were removed with a cold snare. Resection and       retrieval were complete.      Non-bleeding internal hemorrhoids were found during retroflexion. The       hemorrhoids were Grade I (internal hemorrhoids that do not prolapse). Impression:           - Perianal skin tags found on perianal exam.                       - Diverticulosis in the sigmoid colon and in the cecum.                       - One 3 mm polyp in the distal sigmoid colon. Biopsied.                       - Two 2 to 4 mm polyps in the rectum, removed with a                        cold snare. Resected and retrieved.                       - Non-bleeding internal hemorrhoids. Recommendation:       - Await pathology results.                       - Discharge patient to home. Procedure Code(s):    --- Professional ---                       [phone], Colonoscopy, flexible; with removal of tumor(s),                        polyp(s), or other lesion(s) by snare technique                       [id number], 31, Colonoscopy, flexible; with biopsy, single                        or multiple Diagnosis Code(s):    --- Professional ---                       K64.0, First degree hemorrhoids                       K57.30, Diverticulosis of large intestine without                        perforation or abscess without bleeding                       K63.5, Polyp of colon                       K62.1, Rectal polyp  K64.4, Residual hemorrhoidal skin tags CPT copyright 2019 American Medical Association. All rights reserved. The codes documented in this report are preliminary and upon coder review may  be revised to meet current compliance requirements. Dr. Sheppard Penton, MD Eliseo Squires MD, MD 03/02/2019 1:37:35 PM This report has been signed electronically. Number of Addenda: 0 Note Initiated On: 03/02/2019 12:56 PM Scope Withdrawal Time: 0 hours 18 minutes 38  seconds  Total Procedure Duration: 0 hours 23 minutes 56 seconds  Estimated Blood Loss: Estimated blood loss was minimal.      [facility]

## 2019-03-02 NOTE — Anesthesia Postprocedure Evaluation (Signed)
Anesthesia Post Note  Patient: Randy Dean  Procedure(s) Performed: COLONOSCOPY WITH PROPOFOL (N/A )  Patient location during evaluation: Endoscopy Anesthesia Type: General Level of consciousness: awake and alert and oriented Pain management: pain level controlled Vital Signs Assessment: post-procedure vital signs reviewed and stable Respiratory status: spontaneous breathing Cardiovascular status: blood pressure returned to baseline Anesthetic complications: no     Last Vitals:  Vitals:   03/02/19 1400 03/02/19 1410  BP: 121/76 119/81  Pulse: 62 (!) 57  Resp: 20 17  Temp:    SpO2: 99% 100%    Last Pain:  Vitals:   03/02/19 1410  TempSrc:   PainSc: 0-No pain                 Shakeerah Gradel

## 2019-03-02 NOTE — Anesthesia Preprocedure Evaluation (Signed)
Anesthesia Evaluation  Patient identified by MRN, date of birth, ID band Patient awake    Reviewed: Allergy & Precautions, NPO status , Patient's Chart, lab work & pertinent test results  Airway Mallampati: II  TM Distance: >3 FB Neck ROM: Full    Dental no notable dental hx.    Pulmonary neg pulmonary ROS, shortness of breath, former smoker,    Pulmonary exam normal breath sounds clear to auscultation       Cardiovascular negative cardio ROS Normal cardiovascular exam Rhythm:Regular Rate:Normal     Neuro/Psych  Headaches, PSYCHIATRIC DISORDERS Anxiety Depression CVA, Residual Symptoms    GI/Hepatic negative GI ROS, Neg liver ROS, GERD  Controlled,  Endo/Other  negative endocrine ROS  Renal/GU negative Renal ROS  negative genitourinary   Musculoskeletal   Abdominal   Peds negative pediatric ROS (+)  Hematology negative hematology ROS (+)   Anesthesia Other Findings Past Medical History: 10/30/2015: Abnormality of gait No date: Allergic rhinitis No date: Anxiety No date: Chronic headaches     Comment:  partially sinus/partially brain injury (per pt) No date: GERD (gastroesophageal reflux disease) 2010: Head trauma     Comment:  has caused anxiety, memory issues and neck problems 10/30/2015: Headache     Comment:  Right temporal  No date: Neck stiffness     Comment:  metal plate.  mild limitations of side to side and up               and dowm movement No date: Prediabetes No date: PTSD (post-traumatic stress disorder) No date: Stroke [facility])     Comment:  TIA x3- some residual right side weakness No date: Vertigo No date: Weakness of right side of body     Comment:  from TIAs  Reproductive/Obstetrics negative OB ROS                             Anesthesia Physical  Anesthesia Plan  ASA: III  Anesthesia Plan: General   Post-op Pain Management:    Induction: Intravenous  PONV  Risk Score and Plan:   Airway Management Planned: Nasal Cannula  Additional Equipment:   Intra-op Plan:   Post-operative Plan:   Informed Consent: I have reviewed the patients History and Physical, chart, labs and discussed the procedure including the risks, benefits and alternatives for the proposed anesthesia with the patient or authorized representative who has indicated his/her understanding and acceptance.     Dental advisory given  Plan Discussed with: CRNA  Anesthesia Plan Comments:         Anesthesia Quick Evaluation

## 2019-03-03 ENCOUNTER — Encounter: Payer: Self-pay | Admitting: Surgery

## 2019-03-03 ENCOUNTER — Other Ambulatory Visit: Payer: Self-pay

## 2019-03-03 ENCOUNTER — Telehealth: Payer: Self-pay

## 2019-03-03 DIAGNOSIS — K5740 Diverticulitis of both small and large intestine with perforation and abscess without bleeding: Secondary | ICD-10-CM

## 2019-03-03 NOTE — Telephone Encounter (Signed)
[facility].

## 2019-03-03 NOTE — Telephone Encounter (Signed)
Patient notified. Referral placed

## 2019-03-03 NOTE — Telephone Encounter (Signed)
Patient states that he needs a GI work up. He never saw a gastro physician. He never went to [facility]. He did just have a colonoscopy done yesterday.

## 2019-03-07 LAB — SURGICAL PATHOLOGY

## 2019-03-08 DIAGNOSIS — N183 Chronic kidney disease, stage 3 (moderate): Secondary | ICD-10-CM | POA: Diagnosis not present

## 2019-03-08 DIAGNOSIS — N390 Urinary tract infection, site not specified: Secondary | ICD-10-CM | POA: Diagnosis not present

## 2019-03-09 DIAGNOSIS — N183 Chronic kidney disease, stage 3 (moderate): Secondary | ICD-10-CM | POA: Diagnosis not present

## 2019-03-20 DIAGNOSIS — K5909 Other constipation: Secondary | ICD-10-CM | POA: Diagnosis not present

## 2019-03-20 DIAGNOSIS — R1313 Dysphagia, pharyngeal phase: Secondary | ICD-10-CM | POA: Diagnosis not present

## 2019-03-20 DIAGNOSIS — Z86010 Personal history of colonic polyps: Secondary | ICD-10-CM | POA: Diagnosis not present

## 2019-03-20 DIAGNOSIS — Z8719 Personal history of other diseases of the digestive system: Secondary | ICD-10-CM | POA: Diagnosis not present

## 2019-03-20 DIAGNOSIS — K219 Gastro-esophageal reflux disease without esophagitis: Secondary | ICD-10-CM | POA: Diagnosis not present

## 2019-03-21 ENCOUNTER — Ambulatory Visit: Payer: Self-pay | Admitting: Family Medicine

## 2019-03-21 DIAGNOSIS — Z8719 Personal history of other diseases of the digestive system: Secondary | ICD-10-CM | POA: Diagnosis not present

## 2019-04-06 ENCOUNTER — Encounter: Payer: Self-pay | Admitting: Physician Assistant

## 2019-04-06 ENCOUNTER — Telehealth (INDEPENDENT_AMBULATORY_CARE_PROVIDER_SITE_OTHER): Payer: PPO | Admitting: Physician Assistant

## 2019-04-06 VITALS — Temp 97.9°F | Wt 216.0 lb

## 2019-04-06 DIAGNOSIS — B379 Candidiasis, unspecified: Secondary | ICD-10-CM

## 2019-04-06 DIAGNOSIS — T3695XA Adverse effect of unspecified systemic antibiotic, initial encounter: Secondary | ICD-10-CM | POA: Diagnosis not present

## 2019-04-06 DIAGNOSIS — J0140 Acute pansinusitis, unspecified: Secondary | ICD-10-CM | POA: Diagnosis not present

## 2019-04-06 MED ORDER — AMOXICILLIN-POT CLAVULANATE 875-125 MG PO TABS: 1.0000 | ORAL_TABLET | Freq: Two times a day (BID) | ORAL | 0 refills | Status: DC

## 2019-04-06 MED ORDER — FLUCONAZOLE 150 MG PO TABS: 150.0000 mg | ORAL_TABLET | Freq: Once | ORAL | 0 refills | Status: AC

## 2019-04-06 NOTE — Patient Instructions (Signed)
Sinusitis, Adult Sinusitis is inflammation of your sinuses. Sinuses are hollow spaces in the bones around your face. Your sinuses are located:  Around your eyes.  In the middle of your forehead.  Behind your nose.  In your cheekbones. Mucus normally drains out of your sinuses. When your nasal tissues become inflamed or swollen, mucus can become trapped or blocked. This allows bacteria, viruses, and fungi to grow, which leads to infection. Most infections of the sinuses are caused by a virus. Sinusitis can develop quickly. It can last for up to 4 weeks (acute) or for more than 12 weeks (chronic). Sinusitis often develops after a cold. What are the causes? This condition is caused by anything that creates swelling in the sinuses or stops mucus from draining. This includes:  Allergies.  Asthma.  Infection from bacteria or viruses.  Deformities or blockages in your nose or sinuses.  Abnormal growths in the nose (nasal polyps).  Pollutants, such as chemicals or irritants in the air.  Infection from fungi (rare). What increases the risk? You are more likely to develop this condition if you:  Have a weak body defense system (immune system).  Do a lot of swimming or diving.  Overuse nasal sprays.  Smoke. What are the signs or symptoms? The main symptoms of this condition are pain and a feeling of pressure around the affected sinuses. Other symptoms include:  Stuffy nose or congestion.  Thick drainage from your nose.  Swelling and warmth over the affected sinuses.  Headache.  Upper toothache.  A cough that may get worse at night.  Extra mucus that collects in the throat or the back of the nose (postnasal drip).  Decreased sense of smell and taste.  Fatigue.  A fever.  Sore throat.  Bad breath. How is this diagnosed? This condition is diagnosed based on:  Your symptoms.  Your medical history.  A physical exam.  Tests to find out if your condition is  acute or chronic. This may include: ? Checking your nose for nasal polyps. ? Viewing your sinuses using a device that has a light (endoscope). ? Testing for allergies or bacteria. ? Imaging tests, such as an MRI or CT scan. In rare cases, a bone biopsy may be done to rule out more serious types of fungal sinus disease. How is this treated? Treatment for sinusitis depends on the cause and whether your condition is chronic or acute.  If caused by a virus, your symptoms should go away on their own within 10 days. You may be given medicines to relieve symptoms. They include: ? Medicines that shrink swollen nasal passages (topical intranasal decongestants). ? Medicines that treat allergies (antihistamines). ? A spray that eases inflammation of the nostrils (topical intranasal corticosteroids). ? Rinses that help get rid of thick mucus in your nose (nasal saline washes).  If caused by bacteria, your health care provider may recommend waiting to see if your symptoms improve. Most bacterial infections will get better without antibiotic medicine. You may be given antibiotics if you have: ? A severe infection. ? A weak immune system.  If caused by narrow nasal passages or nasal polyps, you may need to have surgery. Follow these instructions at home: Medicines  Take, use, or apply over-the-counter and prescription medicines only as told by your health care provider. These may include nasal sprays.  If you were prescribed an antibiotic medicine, take it as told by your health care provider. Do not stop taking the antibiotic even if you start   to feel better. Hydrate and humidify   Drink enough fluid to keep your urine pale yellow. Staying hydrated will help to thin your mucus.  Use a cool mist humidifier to keep the humidity level in your home above 50%.  Inhale steam for 10-15 minutes, 3-4 times a day, or as told by your health care provider. You can do this in the bathroom while a hot shower is  running.  Limit your exposure to cool or dry air. Rest  Rest as much as possible.  Sleep with your head raised (elevated).  Make sure you get enough sleep each night. General instructions   Apply a warm, moist washcloth to your face 3-4 times a day or as told by your health care provider. This will help with discomfort.  Wash your hands often with soap and water to reduce your exposure to germs. If soap and water are not available, use hand sanitizer.  Do not smoke. Avoid being around people who are smoking (secondhand smoke).  Keep all follow-up visits as told by your health care provider. This is important. Contact a health care provider if:  You have a fever.  Your symptoms get worse.  Your symptoms do not improve within 10 days. Get help right away if:  You have a severe headache.  You have persistent vomiting.  You have severe pain or swelling around your face or eyes.  You have vision problems.  You develop confusion.  Your neck is stiff.  You have trouble breathing. Summary  Sinusitis is soreness and inflammation of your sinuses. Sinuses are hollow spaces in the bones around your face.  This condition is caused by nasal tissues that become inflamed or swollen. The swelling traps or blocks the flow of mucus. This allows bacteria, viruses, and fungi to grow, which leads to infection.  If you were prescribed an antibiotic medicine, take it as told by your health care provider. Do not stop taking the antibiotic even if you start to feel better.  Keep all follow-up visits as told by your health care provider. This is important. This information is not intended to replace advice given to you by your health care provider. Make sure you discuss any questions you have with your health care provider. Document Released: 06/15/2005 Document Revised: 11/15/2017 Document Reviewed: 11/15/2017 [employer] Patient Education  2020 [employer]  

## 2019-04-06 NOTE — Progress Notes (Signed)
Patient: Randy Dean Male    DOB: May 16, 1966   53 y.o.   MRN: [id number] Visit Date: 04/06/2019  Today's Provider: Mar Daring, PA-C   Chief Complaint  Patient presents with  . URI   Subjective:    I,Joseline E. Rosas,RMA am acting as a Education administrator for [employer], PA-C.   Virtual Visit via Video Note  I connected with [name] on 04/06/19 at  1:40 PM EDT by a video enabled telemedicine application and verified that I am speaking with the correct person using two identifiers.  Location: Patient: Home Provider: Home office   I discussed the limitations of evaluation and management by telemedicine and the availability of in person appointments. The patient expressed understanding and agreed to proceed.  URI  This is a new problem. The current episode started in the past 7 days (over the weekend). The problem has been gradually worsening. There has been no fever. Associated symptoms include congestion, coughing (post nasal drainage cough per patient), headaches, a plugged ear sensation and sinus pain. Pertinent negatives include no sore throat or wheezing. Associated symptoms comments: Body aches started today. He has tried antihistamine (Sudafed PE, Tylenol, flonase and Claritin) for the symptoms. The treatment provided no relief.    Patient reports he started noticing burning and inflammation of his frontal and maxillary sinuses over the weekend. He started his medications for his allergies, but states that today he woke up and had a more intense headache and more congestion consistent with his previous sinus infections. He denies any known exposure to covid-19.    Allergies  Allergen Reactions  . Serzone [Nefazodone]     Tongue and facial swelling  . Codeine Nausea Only    Dizziness, sweating  . Doxycycline Other (See Comments)    Severe headaches  . Effexor [Venlafaxine]     "Feels out of it"  . Paxil [Paroxetine Hcl]     "does not feel right  in the head"  . Esomeprazole Sodium Rash     Current Outpatient Medications:  .  acetaminophen (TYLENOL) 500 MG tablet, Take 500 mg by mouth every 6 (six) hours as needed for mild pain. , Disp: , Rfl:  .  albuterol (PROVENTIL HFA) 108 (90 Base) MCG/ACT inhaler, Inhale 1 puff into the lungs every 4 (four) hours as needed for wheezing or shortness of breath., Disp: 1 Inhaler, Rfl: 0 .  ALPRAZolam (XANAX) 0.5 MG tablet, Take 2 tablets approximately 45 minutes prior to the MRI study, take a third tablet if needed., Disp: 3 tablet, Rfl: 0 .  dexlansoprazole (DEXILANT) 60 MG capsule, Take 1 capsule (60 mg total) by mouth daily., Disp: 30 capsule, Rfl: 11 .  diazepam (VALIUM) 5 MG tablet, Take 1 tablet (5 mg total) by mouth See admin instructions. 1 PO 1 hour prior to procedure, may repeat if needed, Disp: 3 tablet, Rfl: 0 .  fluticasone (FLONASE) 50 MCG/ACT nasal spray, Place 2 sprays into both nostrils 2 (two) times daily., Disp: 96 g, Rfl: 0 .  loratadine (CLARITIN) 10 MG tablet, Take 10 mg by mouth daily., Disp: , Rfl:  .  magnesium oxide (MAG-OX) 400 (241.3 Mg) MG tablet, Take 1 tablet (400 mg total) by mouth 2 (two) times daily., Disp: 60 tablet, Rfl: 5 .  MECLIZINE HCL PO, Take by mouth., Disp: , Rfl:  .  Multiple Vitamin (MULTIVITAMIN) capsule, Take 1 capsule by mouth daily., Disp: , Rfl:  .  oxyCODONE (OXY  IR/ROXICODONE) 5 MG immediate release tablet, Take 1 tablet (5 mg total) by mouth every 6 (six) hours as needed for severe pain., Disp: 30 tablet, Rfl: 0 .  aspirin EC 81 MG tablet, Take 81 mg by mouth daily., Disp: , Rfl:  .  gabapentin (NEURONTIN) 100 MG capsule, Take 300 mg by mouth daily. , Disp: , Rfl:  .  nystatin (MYCOSTATIN) 100000 UNIT/ML suspension, Take 5 mLs (500,000 Units total) by mouth 4 (four) times daily. (Patient not taking: Reported on 04/06/2019), Disp: 60 mL, Rfl: 0 .  sertraline (ZOLOFT) 100 MG tablet, Take 1 tablet (100 mg total) by mouth daily. (Patient not taking:  Reported on 04/06/2019), Disp: 90 tablet, Rfl: 3 .  sucralfate (CARAFATE) 1 g tablet, Take 1 tablet (1 g total) by mouth 4 (four) times daily. Take 1 tablet with each meal and 1 tablet at bedtime, Disp: 180 tablet, Rfl: 0  Review of Systems  Constitutional: Positive for fatigue. Negative for fever.  HENT: Positive for congestion, postnasal drip, sinus pain and voice change. Negative for sore throat.   Eyes:       Eye burning  Respiratory: Positive for cough (post nasal drainage cough per patient). Negative for chest tightness, shortness of breath and wheezing.   Neurological: Positive for headaches.    Social History   Tobacco Use  . Smoking status: Former Smoker    Packs/day: 0.50    Years: 3.00    Pack years: 1.50    Quit date: 06/28/1984    Years since quitting: 34.7  . Smokeless tobacco: Current User    Types: Snuff    Last attempt to quit: 06/29/1986  . Tobacco comment: 1 can dip/day  Substance Use Topics  . Alcohol use: No    Alcohol/week: 0.0 standard drinks      Objective:   Temp 97.9 F (36.6 C) (Tympanic) Comment: 97.3 forehead  Wt 216 lb (98 kg)   BMI 29.29 kg/m  Vitals:   04/06/19 1109  Temp: 97.9 F (36.6 C)  TempSrc: Tympanic  Weight: 216 lb (98 kg)  Body mass index is 29.29 kg/m.   Physical Exam Vitals signs reviewed.  Constitutional:      General: He is not in acute distress.    Appearance: Normal appearance. He is well-developed. He is not ill-appearing.  HENT:     Head: Normocephalic and atraumatic.  Eyes:     General: No scleral icterus.    Conjunctiva/sclera: Conjunctivae normal.  Neck:     Musculoskeletal: Normal range of motion and neck supple.  Pulmonary:     Effort: Pulmonary effort is normal. No respiratory distress.  Neurological:     Mental Status: He is alert.  Psychiatric:        Mood and Affect: Mood normal.        Behavior: Behavior normal.        Thought Content: Thought content normal.        Judgment: Judgment normal.       No results found for any visits on 04/06/19.     Assessment & Plan     1. Acute non-recurrent pansinusitis Worsening symptoms that have not responded to OTC medications. Will give augmentin as below. Continue allergy medications. Stay well hydrated and get plenty of rest. Call if no symptom improvement or if symptoms worsen. - amoxicillin-clavulanate (AUGMENTIN) 875-125 MG tablet; Take 1 tablet by mouth 2 (two) times daily.  Dispense: 20 tablet; Refill: 0  2. Antibiotic-induced yeast infection Gets yeast infections (  thrush) with antibiotics. Diflucan given as below.  - fluconazole (DIFLUCAN) 150 MG tablet; Take 1 tablet (150 mg total) by mouth once for 1 dose.  Dispense: 1 tablet; Refill: 0   I discussed the assessment and treatment plan with the patient. The patient was provided an opportunity to ask questions and all were answered. The patient agreed with the plan and demonstrated an understanding of the instructions.   The patient was advised to call back or seek an in-person evaluation if the symptoms worsen or if the condition fails to improve as anticipated.  I provided 10 minutes of non-face-to-face time during this encounter.    Mar Daring, PA-C  [facility]

## 2019-04-11 ENCOUNTER — Telehealth: Payer: Self-pay | Admitting: Family Medicine

## 2019-04-11 NOTE — Chronic Care Management (AMB) (Signed)
°  Chronic Care Management   Outreach Note  04/11/2019 Name: Randy Dean MRN: [id number] DOB: 08/01/1965  Referred by: Jerrol Banana., MD Reason for referral : Chronic Care Management (Initial CCM outreach was unsuccessful. )   An unsuccessful telephone outreach was attempted today. The patient was referred to the case management team by for assistance with care management and care coordination.   Follow Up Plan: A HIPPA compliant phone message was left for the patient providing contact information and requesting a return call.  The care management team will reach out to the patient again over the next 7 days.  If patient returns call to provider office, please advise to call Delia at [place]  [email or web address]   ??[phone]

## 2019-04-12 ENCOUNTER — Ambulatory Visit: Payer: PPO | Admitting: Gastroenterology

## 2019-04-18 NOTE — Chronic Care Management (AMB) (Signed)
°  Chronic Care Management   Outreach Note  04/18/2019 Name: Randy Dean MRN: [id number] DOB: [birth year]  Referred by: Jerrol Banana., MD Reason for referral : Chronic Care Management (Initial CCM outreach was unsuccessful. ) and Chronic Care Management (Second CCM outreach )   A second unsuccessful telephone outreach was attempted today. The patient was referred to the case management team for assistance with care management and care coordination.   Follow Up Plan: A HIPPA compliant phone message was left for the patient providing contact information and requesting a return call.  The care management team will reach out to the patient again over the next 7 days.  If patient returns call to provider office, please advise to call [facility] at [place]  [email or web address]   ??[phone]

## 2019-04-19 ENCOUNTER — Ambulatory Visit: Payer: Self-pay | Admitting: Family Medicine

## 2019-04-26 NOTE — Progress Notes (Signed)
Patient: Randy Dean Male    DOB: 1965/10/26   53 y.o.   MRN: [id number] Visit Date: 05/01/2019  Today's Provider: [name], MD   Chief Complaint  Patient presents with  . Depression  . Gastroesophageal Reflux  . Neck Pain   Subjective:     Neck Pain  This is a new problem. The current episode started 1 to 4 weeks ago. The problem occurs constantly. The problem has been unchanged. The pain is present in the right side. The quality of the pain is described as aching, burning, cramping, shooting and stabbing. Associated symptoms include leg pain and numbness. Pertinent negatives include no chest pain or fever.   Patient reports that he has a appointment to see neurologist on 11/16. Patient states that he has also experienced two episodes of pain on the right side of his head that he describes as sharp and states that he had visual changes but incidents. Patient had cervical disc surgery in 1996.  He then had traumatic brain injury in 2010 in an accident. He also complains in addition to above head symptoms to numbness from his elbows down in both arms.  Also some occasional numbness in his right leg.  Ongoing problem that he has had.  Depression, Follow-up  He  was last seen for this 3 months ago. Changes made at last visit include none,condition stable.   He reports good compliance with treatment patient states that he takes Sertraline PRN. He is not having side effects.   He reports good tolerance of treatment. Current symptoms include: depressed mood, difficulty concentrating and insomnia He feels he is Unchanged since last visit, patient reports today that he is in constant pain and states that it effects his mood but states that he does not feel depressed but also states " I do not care."   ------------------------------------------------------------------------   GERD, Follow up:  The patient was last seen for GERD 3 months ago. Changes made since  that visit include none.  He reports good compliance with treatment. He is not having side effects. Marland Kitchen  He IS experiencing abdominal bloating, belching, chest pain, choking on food, difficulty swallowing, heartburn, need to clear throat frequently and shortness of breath.and cough.  He is NOT experiencing bilious reflux, early satiety, fullness after meals, hematemesis, hoarseness, laryngitis, melena, nocturnal burning, odynophagia, symptoms primarily relate to meals, and lying down after meals, unexpected weight loss, upper abdominal discomfort, waterbrash or wheezing  ------------------------------------------------------------------------ Myalgia From 12/19/2018-currently taking magnesium oxide (MAG-OX) 400 (241.3 Mg) MG tablet bid.  Diverticulitis of both large and small intestine with perforation without bleeding From 12/19/2018-Continue present antibiotics and refer to GI for consideration of colonoscopy. Labs stable. Now clinically resolved.   Allergies  Allergen Reactions  . Serzone [Nefazodone]     Tongue and facial swelling  . Codeine Nausea Only    Dizziness, sweating  . Doxycycline Other (See Comments)    Severe headaches  . Effexor [Venlafaxine]     "Feels out of it"  . Paxil [Paroxetine Hcl]     "does not feel right in the head"  . Esomeprazole Sodium Rash     Current Outpatient Medications:  .  acetaminophen (TYLENOL) 500 MG tablet, Take 500 mg by mouth every 6 (six) hours as needed for mild pain. , Disp: , Rfl:  .  albuterol (PROVENTIL HFA) 108 (90 Base) MCG/ACT inhaler, Inhale 1 puff into the lungs every 4 (four) hours as needed for wheezing or  shortness of breath., Disp: 1 Inhaler, Rfl: 0 .  ALPRAZolam (XANAX) 0.5 MG tablet, Take 2 tablets approximately 45 minutes prior to the MRI study, take a third tablet if needed., Disp: 3 tablet, Rfl: 0 .  aspirin EC 81 MG tablet, Take 81 mg by mouth daily., Disp: , Rfl:  .  dexlansoprazole (DEXILANT) 60 MG capsule, Take 1  capsule (60 mg total) by mouth daily., Disp: 30 capsule, Rfl: 11 .  diazepam (VALIUM) 5 MG tablet, Take 1 tablet (5 mg total) by mouth See admin instructions. 1 PO 1 hour prior to procedure, may repeat if needed, Disp: 3 tablet, Rfl: 0 .  famotidine (PEPCID) 40 MG tablet, , Disp: , Rfl:  .  fluticasone (FLONASE) 50 MCG/ACT nasal spray, Place 2 sprays into both nostrils 2 (two) times daily., Disp: 96 g, Rfl: 0 .  gabapentin (NEURONTIN) 100 MG capsule, Take 300 mg by mouth daily. , Disp: , Rfl:  .  loratadine (CLARITIN) 10 MG tablet, Take 10 mg by mouth daily., Disp: , Rfl:  .  magnesium oxide (MAG-OX) 400 (241.3 Mg) MG tablet, Take 1 tablet (400 mg total) by mouth 2 (two) times daily., Disp: 60 tablet, Rfl: 5 .  MECLIZINE HCL PO, Take by mouth., Disp: , Rfl:  .  Multiple Vitamin (MULTIVITAMIN) capsule, Take 1 capsule by mouth daily., Disp: , Rfl:  .  nystatin (MYCOSTATIN) 100000 UNIT/ML suspension, Take 5 mLs (500,000 Units total) by mouth 4 (four) times daily., Disp: 60 mL, Rfl: 0 .  oxyCODONE (OXY IR/ROXICODONE) 5 MG immediate release tablet, Take 1 tablet (5 mg total) by mouth every 6 (six) hours as needed for severe pain., Disp: 30 tablet, Rfl: 0 .  sertraline (ZOLOFT) 100 MG tablet, Take 1 tablet (100 mg total) by mouth daily., Disp: 90 tablet, Rfl: 3 .  sucralfate (CARAFATE) 1 g tablet, Take 1 tablet (1 g total) by mouth 4 (four) times daily. Take 1 tablet with each meal and 1 tablet at bedtime, Disp: 180 tablet, Rfl: 0  Review of Systems  Constitutional: Negative for appetite change, chills and fever.  Eyes: Negative.   Respiratory: Negative for chest tightness, shortness of breath and wheezing.   Cardiovascular: Negative for chest pain and palpitations.  Gastrointestinal: Negative for abdominal pain, nausea and vomiting.  Endocrine: Negative.   Musculoskeletal: Positive for neck pain.  Allergic/Immunologic: Negative.   Neurological: Positive for numbness.  Hematological: Negative.    Psychiatric/Behavioral: Negative.     Social History   Tobacco Use  . Smoking status: Former Smoker    Packs/day: 0.50    Years: 3.00    Pack years: 1.50    Quit date: 06/28/1984    Years since quitting: 34.8  . Smokeless tobacco: Current User    Types: Snuff    Last attempt to quit: 06/29/1986  . Tobacco comment: 1 can dip/day  Substance Use Topics  . Alcohol use: No    Alcohol/week: 0.0 standard drinks      Objective:   BP 128/72   Pulse 90   Temp (!) 96.9 F (36.1 C) (Oral)   Resp 16   Wt 223 lb 9.6 oz (101.4 kg)   SpO2 98%   BMI 30.33 kg/m  Vitals:   05/01/19 0852  BP: 128/72  Pulse: 90  Resp: 16  Temp: (!) 96.9 F (36.1 C)  TempSrc: Oral  SpO2: 98%  Weight: 223 lb 9.6 oz (101.4 kg)  Body mass index is 30.33 kg/m.   Physical Exam Vitals  signs reviewed.  Constitutional:      Appearance: Normal appearance.  HENT:     Head: Normocephalic and atraumatic.     Right Ear: External ear normal.     Left Ear: External ear normal.  Eyes:     General: No scleral icterus.    Conjunctiva/sclera: Conjunctivae normal.  Cardiovascular:     Rate and Rhythm: Normal rate and regular rhythm.     Heart sounds: Normal heart sounds.  Pulmonary:     Breath sounds: Normal breath sounds.  Abdominal:     Palpations: Abdomen is soft.  Skin:    General: Skin is warm and dry.     Comments: Examination of both upper extremities reveals what appears to be mild weakness in biceps and triceps.  Grip appears to be normal.  Neurological:     General: No focal deficit present.     Mental Status: He is alert and oriented to person, place, and time.  Psychiatric:        Mood and Affect: Mood normal.        Behavior: Behavior normal.        Thought Content: Thought content normal.        Judgment: Judgment normal.      No results found for any visits on 05/01/19.     Assessment & Plan    1. Neck pain Cyclobenzaprine bedtime. - DG Cervical Spine Complete; Future -  cyclobenzaprine (FLEXERIL) 10 MG tablet; Take 1 tablet, one hour before bedtime  Dispense: 30 tablet; Refill: 5  2. Need for influenza vaccination  - Flu Vaccine QUAD 36+ mos IM  3. Diverticulitis of large intestine with abscess without bleeding Resolved.  Patient has GI follow-up arranged.  4. Intervertebral cervical disc disorder with myelopathy, cervical region Patient will see neurology there this month.  5. Traumatic brain injury, without loss of consciousness, sequela ([place])  From 2010.  6. Anxiety   7. Cervical pain      [name], MD  [facility]

## 2019-04-26 NOTE — Chronic Care Management (AMB) (Signed)
Chronic Care Management   Note  04/26/2019 Name: Randy Dean MRN: [id number] DOB: [birth year]  Randy Dean is a 53 y.o. year old male who is a primary care patient of Jerrol Banana., MD. I reached out to [name] by phone today in response to a referral sent by [name]'s health plan.     Randy Dean was given information about Chronic Care Management services today including:  1. CCM service includes personalized support from designated clinical staff supervised by his physician, including individualized plan of care and coordination with other care providers 2. 24/7 contact phone numbers for assistance for urgent and routine care needs. 3. Service will only be billed when office clinical staff spend 20 minutes or more in a month to coordinate care. 4. Only one practitioner may furnish and bill the service in a calendar month. 5. The patient may stop CCM services at any time (effective at the end of the month) by phone call to the office staff. 6. The patient will be responsible for cost sharing (co-pay) of up to 20% of the service fee (after annual deductible is met).  Patient did not agree to enrollment in care management services. He would like to discuss services with PCP first.   Follow up plan: The care management team is available to follow up with the patient after provider conversation with the patient regarding recommendation for care management engagement and subsequent re-referral to the care management team.   Raymond  [email or web address]   ??[phone]

## 2019-05-01 ENCOUNTER — Ambulatory Visit (INDEPENDENT_AMBULATORY_CARE_PROVIDER_SITE_OTHER): Payer: PPO | Admitting: Family Medicine

## 2019-05-01 ENCOUNTER — Encounter: Payer: Self-pay | Admitting: Family Medicine

## 2019-05-01 ENCOUNTER — Other Ambulatory Visit: Payer: Self-pay

## 2019-05-01 ENCOUNTER — Ambulatory Visit
Admission: RE | Admit: 2019-05-01 | Discharge: 2019-05-01 | Disposition: A | Discharge status: Home / Self Care | Payer: PPO | Source: Ambulatory Visit | Attending: Family Medicine | Admitting: Family Medicine

## 2019-05-01 VITALS — BP 128/72 | HR 90 | Temp 96.9°F | Resp 16 | Wt 223.6 lb

## 2019-05-01 DIAGNOSIS — M5000 Cervical disc disorder with myelopathy, unspecified cervical region: Secondary | ICD-10-CM

## 2019-05-01 DIAGNOSIS — F419 Anxiety disorder, unspecified: Secondary | ICD-10-CM | POA: Diagnosis not present

## 2019-05-01 DIAGNOSIS — S069X0S Unspecified intracranial injury without loss of consciousness, sequela: Secondary | ICD-10-CM

## 2019-05-01 DIAGNOSIS — K5720 Diverticulitis of large intestine with perforation and abscess without bleeding: Secondary | ICD-10-CM | POA: Diagnosis not present

## 2019-05-01 DIAGNOSIS — M542 Cervicalgia: Secondary | ICD-10-CM | POA: Insufficient documentation

## 2019-05-01 DIAGNOSIS — Z23 Encounter for immunization: Secondary | ICD-10-CM

## 2019-05-01 DIAGNOSIS — M47812 Spondylosis without myelopathy or radiculopathy, cervical region: Secondary | ICD-10-CM | POA: Diagnosis not present

## 2019-05-01 MED ORDER — CYCLOBENZAPRINE HCL 10 MG PO TABS: ORAL_TABLET | ORAL | 5 refills | Status: DC

## 2019-05-02 ENCOUNTER — Encounter: Payer: Self-pay | Admitting: *Deleted

## 2019-05-15 DIAGNOSIS — R200 Anesthesia of skin: Secondary | ICD-10-CM | POA: Diagnosis not present

## 2019-05-15 DIAGNOSIS — R519 Headache, unspecified: Secondary | ICD-10-CM | POA: Diagnosis not present

## 2019-05-15 DIAGNOSIS — F411 Generalized anxiety disorder: Secondary | ICD-10-CM | POA: Diagnosis not present

## 2019-05-15 DIAGNOSIS — R202 Paresthesia of skin: Secondary | ICD-10-CM | POA: Diagnosis not present

## 2019-05-15 DIAGNOSIS — R42 Dizziness and giddiness: Secondary | ICD-10-CM | POA: Diagnosis not present

## 2019-05-15 DIAGNOSIS — M5090 Cervical disc disorder, unspecified, unspecified cervical region: Secondary | ICD-10-CM | POA: Diagnosis not present

## 2019-05-17 DIAGNOSIS — R202 Paresthesia of skin: Secondary | ICD-10-CM | POA: Diagnosis not present

## 2019-05-17 DIAGNOSIS — R200 Anesthesia of skin: Secondary | ICD-10-CM | POA: Diagnosis not present

## 2019-05-18 ENCOUNTER — Other Ambulatory Visit: Payer: Self-pay | Admitting: Neurology

## 2019-05-18 DIAGNOSIS — R200 Anesthesia of skin: Secondary | ICD-10-CM

## 2019-05-18 DIAGNOSIS — R202 Paresthesia of skin: Secondary | ICD-10-CM

## 2019-06-01 ENCOUNTER — Ambulatory Visit
Admission: RE | Admit: 2019-06-01 | Discharge: 2019-06-01 | Disposition: A | Discharge status: Home / Self Care | Payer: PPO | Source: Ambulatory Visit | Attending: Neurology | Admitting: Neurology

## 2019-06-01 ENCOUNTER — Other Ambulatory Visit: Payer: Self-pay | Admitting: Neurology

## 2019-06-01 ENCOUNTER — Other Ambulatory Visit: Payer: Self-pay

## 2019-06-01 DIAGNOSIS — R200 Anesthesia of skin: Secondary | ICD-10-CM | POA: Insufficient documentation

## 2019-06-01 DIAGNOSIS — R202 Paresthesia of skin: Secondary | ICD-10-CM

## 2019-06-01 DIAGNOSIS — M542 Cervicalgia: Secondary | ICD-10-CM | POA: Diagnosis not present

## 2019-06-01 LAB — POCT I-STAT CREATININE: Creatinine, Ser: 1.9 mg/dL — ABNORMAL HIGH (ref 0.61–1.24)

## 2019-06-01 MED ORDER — GADOBUTROL 1 MMOL/ML IV SOLN: 10.0000 mL | Freq: Once | INTRAVENOUS | Status: DC | PRN

## 2019-06-16 DIAGNOSIS — M5090 Cervical disc disorder, unspecified, unspecified cervical region: Secondary | ICD-10-CM | POA: Diagnosis not present

## 2019-06-16 DIAGNOSIS — R42 Dizziness and giddiness: Secondary | ICD-10-CM | POA: Diagnosis not present

## 2019-06-16 DIAGNOSIS — R519 Headache, unspecified: Secondary | ICD-10-CM | POA: Diagnosis not present

## 2019-06-16 DIAGNOSIS — M79601 Pain in right arm: Secondary | ICD-10-CM | POA: Diagnosis not present

## 2019-06-16 DIAGNOSIS — M542 Cervicalgia: Secondary | ICD-10-CM | POA: Diagnosis not present

## 2019-06-16 DIAGNOSIS — G5603 Carpal tunnel syndrome, bilateral upper limbs: Secondary | ICD-10-CM | POA: Diagnosis not present

## 2019-06-19 NOTE — Telephone Encounter (Signed)
Entered in [email or web address]

## 2019-06-21 ENCOUNTER — Other Ambulatory Visit: Payer: Self-pay | Admitting: Family Medicine

## 2019-06-26 ENCOUNTER — Other Ambulatory Visit: Payer: Self-pay

## 2019-06-26 ENCOUNTER — Other Ambulatory Visit
Admission: RE | Admit: 2019-06-26 | Discharge: 2019-06-26 | Disposition: A | Discharge status: Home / Self Care | Payer: PPO | Source: Ambulatory Visit | Attending: Internal Medicine | Admitting: Internal Medicine

## 2019-06-26 DIAGNOSIS — Z20828 Contact with and (suspected) exposure to other viral communicable diseases: Secondary | ICD-10-CM | POA: Diagnosis not present

## 2019-06-26 DIAGNOSIS — Z01812 Encounter for preprocedural laboratory examination: Secondary | ICD-10-CM | POA: Diagnosis not present

## 2019-06-27 LAB — SARS CORONAVIRUS 2 (TAT 6-24 HRS): SARS Coronavirus 2: NEGATIVE

## 2019-06-28 ENCOUNTER — Ambulatory Visit: Admission: RE | Admit: 2019-06-28 | Payer: PPO | Source: Home / Self Care | Admitting: Internal Medicine

## 2019-06-28 ENCOUNTER — Encounter: Admission: RE | Payer: Self-pay | Source: Home / Self Care

## 2019-06-28 SURGERY — ESOPHAGOGASTRODUODENOSCOPY (EGD) WITH PROPOFOL
Anesthesia: General

## 2019-06-30 DIAGNOSIS — I639 Cerebral infarction, unspecified: Secondary | ICD-10-CM

## 2019-06-30 HISTORY — DX: Cerebral infarction, unspecified: I63.9

## 2019-07-05 DIAGNOSIS — M5481 Occipital neuralgia: Secondary | ICD-10-CM | POA: Diagnosis not present

## 2019-07-05 DIAGNOSIS — G5603 Carpal tunnel syndrome, bilateral upper limbs: Secondary | ICD-10-CM | POA: Diagnosis not present

## 2019-07-06 DIAGNOSIS — G5603 Carpal tunnel syndrome, bilateral upper limbs: Secondary | ICD-10-CM | POA: Diagnosis not present

## 2019-07-07 NOTE — Progress Notes (Signed)
Patient: Randy Dean Male    DOB: 1966/01/03   54 y.o.   MRN: [id number] Visit Date: 07/10/2019  Today's Provider: [name], MD   Chief Complaint  Patient presents with  . Follow-up   Subjective:    Virtual Visit via Video Note  I connected with Randy Dean on 07/10/19 at  8:20 AM EST by a video enabled telemedicine application and verified that I am speaking with the correct person using two identifiers.  Location: Patient: Home Provider: Home   I discussed the limitations of evaluation and management by telemedicine and the availability of in person appointments. The patient expressed understanding and agreed to proceed.  HPI   Neck pain From 05/01/2019-given rx for Cyclobenzaprine. Cervical x-ray obtained. Dr. Manuella Ghazi from neurology referred him to the pain clinic. He is discouraged because Dr. Manuella Ghazi told him his MRI of his brain was abnormal and would not improve.  He will slowly get worse.  Otherwise stable.  Mild coughing improved with albuterol MDI  Allergies  Allergen Reactions  . Serzone [Nefazodone]     Tongue and facial swelling  . Codeine Nausea Only    Dizziness, sweating  . Doxycycline Other (See Comments)    Severe headaches  . Effexor [Venlafaxine]     "Feels out of it"  . Paxil [Paroxetine Hcl]     "does not feel right in the head"  . Esomeprazole Sodium Rash     Current Outpatient Medications:  .  acetaminophen (TYLENOL) 500 MG tablet, Take 500 mg by mouth every 6 (six) hours as needed for mild pain. , Disp: , Rfl:  .  albuterol (PROVENTIL HFA) 108 (90 Base) MCG/ACT inhaler, Inhale 1 puff into the lungs every 4 (four) hours as needed for wheezing or shortness of breath., Disp: 1 Inhaler, Rfl: 0 .  ALPRAZolam (XANAX) 0.5 MG tablet, Take 2 tablets approximately 45 minutes prior to the MRI study, take a third tablet if needed., Disp: 3 tablet, Rfl: 0 .  cyclobenzaprine (FLEXERIL) 10 MG tablet, Take 1 tablet, one hour before  bedtime, Disp: 30 tablet, Rfl: 5 .  dexlansoprazole (DEXILANT) 60 MG capsule, Take 1 capsule (60 mg total) by mouth daily., Disp: 30 capsule, Rfl: 11 .  diazepam (VALIUM) 5 MG tablet, Take 1 tablet (5 mg total) by mouth See admin instructions. 1 PO 1 hour prior to procedure, may repeat if needed, Disp: 3 tablet, Rfl: 0 .  famotidine (PEPCID) 40 MG tablet, , Disp: , Rfl:  .  fluticasone (FLONASE) 50 MCG/ACT nasal spray, Place 2 sprays into both nostrils 2 (two) times daily., Disp: 96 g, Rfl: 0 .  gabapentin (NEURONTIN) 100 MG capsule, Take 300 mg by mouth daily. , Disp: , Rfl:  .  loratadine (CLARITIN) 10 MG tablet, Take 10 mg by mouth daily., Disp: , Rfl:  .  MECLIZINE HCL PO, Take by mouth., Disp: , Rfl:  .  Multiple Vitamin (MULTIVITAMIN) capsule, Take 1 capsule by mouth daily., Disp: , Rfl:  .  oxyCODONE (OXY IR/ROXICODONE) 5 MG immediate release tablet, Take 1 tablet (5 mg total) by mouth every 6 (six) hours as needed for severe pain., Disp: 30 tablet, Rfl: 0 .  sertraline (ZOLOFT) 100 MG tablet, Take 1 tablet (100 mg total) by mouth daily., Disp: 90 tablet, Rfl: 3 .  aspirin EC 81 MG tablet, Take 81 mg by mouth daily., Disp: , Rfl:  .  magnesium oxide (MAG-OX) 400 (241.3 Mg) MG tablet,  Take 1 tablet (400 mg total) by mouth 2 (two) times daily., Disp: 60 tablet, Rfl: 5 .  nystatin (MYCOSTATIN) 100000 UNIT/ML suspension, Take 5 mLs (500,000 Units total) by mouth 4 (four) times daily., Disp: 60 mL, Rfl: 0 .  sucralfate (CARAFATE) 1 g tablet, Take 1 tablet (1 g total) by mouth 4 (four) times daily. Take 1 tablet with each meal and 1 tablet at bedtime (Patient not taking: Reported on 07/10/2019), Disp: 180 tablet, Rfl: 0  Review of Systems  Constitutional: Negative for appetite change, chills and fever.  Eyes: Negative.   Respiratory: Negative for chest tightness, shortness of breath and wheezing.   Cardiovascular: Negative for chest pain and palpitations.  Gastrointestinal: Negative for  abdominal pain, nausea and vomiting.  Hematological: Negative.   Psychiatric/Behavioral: The patient is nervous/anxious.     Social History   Tobacco Use  . Smoking status: Former Smoker    Packs/day: 0.50    Years: 3.00    Pack years: 1.50    Quit date: 06/28/1984    Years since quitting: 35.0  . Smokeless tobacco: Current User    Types: Snuff    Last attempt to quit: 06/29/1986  . Tobacco comment: 1 can dip/day  Substance Use Topics  . Alcohol use: No    Alcohol/week: 0.0 standard drinks      Objective:   There were no vitals taken for this visit. There were no vitals filed for this visit.There is no height or weight on file to calculate BMI.   Physical Exam   No results found for any visits on 07/10/19.     Assessment & Plan    1. Asthmatic bronchitis without complication, unspecified asthma severity, unspecified whether persistent  - albuterol (PROVENTIL HFA) 108 (90 Base) MCG/ACT inhaler; Inhale 1 puff into the lungs every 4 (four) hours as needed for wheezing or shortness of breath.  Dispense: 18 g; Refill: 11  2. Intervertebral cervical disc disorder with myelopathy, cervical region Per Dr Manuella Ghazi,  3. Traumatic brain injury, without loss of consciousness, sequela ([place]) Neurology,Dr Manuella Ghazi following  4. Anxiety Stable.     Richard Cranford Mon, MD  [facility]

## 2019-07-10 ENCOUNTER — Encounter: Payer: Self-pay | Admitting: Family Medicine

## 2019-07-10 ENCOUNTER — Telehealth (INDEPENDENT_AMBULATORY_CARE_PROVIDER_SITE_OTHER): Payer: PPO | Admitting: Family Medicine

## 2019-07-10 DIAGNOSIS — M5000 Cervical disc disorder with myelopathy, unspecified cervical region: Secondary | ICD-10-CM | POA: Diagnosis not present

## 2019-07-10 DIAGNOSIS — F419 Anxiety disorder, unspecified: Secondary | ICD-10-CM

## 2019-07-10 DIAGNOSIS — J45909 Unspecified asthma, uncomplicated: Secondary | ICD-10-CM

## 2019-07-10 DIAGNOSIS — S069X0S Unspecified intracranial injury without loss of consciousness, sequela: Secondary | ICD-10-CM

## 2019-07-11 ENCOUNTER — Telehealth: Payer: Self-pay | Admitting: Family Medicine

## 2019-07-11 MED ORDER — ALBUTEROL SULFATE HFA 108 (90 BASE) MCG/ACT IN AERS: 1.0000 | INHALATION_SPRAY | RESPIRATORY_TRACT | 11 refills | Status: DC | PRN

## 2019-07-11 NOTE — Telephone Encounter (Signed)
I would actually recommend a covid test ahead of antibiotics.

## 2019-07-11 NOTE — Telephone Encounter (Signed)
Patient states he just got tested for covid on 06/26/2019 with negative results. Do you still advise he get another test?

## 2019-07-11 NOTE — Telephone Encounter (Signed)
Patient would like to know if Dr. Rosanna Randy would be willing to send something into pharmacy for him for a potential sinus infection. Patient had visit yesterday.    Pharmacy:  [facility], [place] Phone:  [phone]  Fax:  [phone]

## 2019-07-11 NOTE — Telephone Encounter (Signed)
Returned call to pt. Patient stated he mentioned to Dr. Rosanna Randy on his virtual visit yesterday that he had a sore throat and sinus drainage. Patient states today he is having sore throat, swollen throat, sinus drainage, sinus pressure, headache, and nasal congestion. No fever. Patient was wanting to know if Dr. Rosanna Randy would give him an antibiotic? Advised patient Dr. Rosanna Randy may not get message until tomorrow and patient stated that was okay.

## 2019-07-12 MED ORDER — AZITHROMYCIN 250 MG PO TABS: ORAL_TABLET | ORAL | 0 refills | Status: AC

## 2019-07-12 NOTE — Telephone Encounter (Signed)
Patient called back this morning to see if his message from yesterday has been responded to. Patient is requesting message be sent to a different provider. Please advise?

## 2019-07-12 NOTE — Telephone Encounter (Signed)
Prescription azithromycin sent to [facility]

## 2019-07-12 NOTE — Telephone Encounter (Signed)
Tried calling patient. Left message to call back. OK for PEC to advise patient of message below.

## 2019-07-13 DIAGNOSIS — G5603 Carpal tunnel syndrome, bilateral upper limbs: Secondary | ICD-10-CM | POA: Diagnosis not present

## 2019-07-14 NOTE — Telephone Encounter (Signed)
Tried calling patient. Left message to call back. 

## 2019-07-14 NOTE — Telephone Encounter (Signed)
I called pharmacy and was advised that patient has picked up prescription.

## 2019-07-22 DIAGNOSIS — R41841 Cognitive communication deficit: Secondary | ICD-10-CM | POA: Diagnosis not present

## 2019-07-22 DIAGNOSIS — R519 Headache, unspecified: Secondary | ICD-10-CM | POA: Diagnosis not present

## 2019-07-22 DIAGNOSIS — Z8673 Personal history of transient ischemic attack (TIA), and cerebral infarction without residual deficits: Secondary | ICD-10-CM | POA: Diagnosis not present

## 2019-07-22 DIAGNOSIS — R531 Weakness: Secondary | ICD-10-CM | POA: Diagnosis not present

## 2019-07-22 DIAGNOSIS — G8191 Hemiplegia, unspecified affecting right dominant side: Secondary | ICD-10-CM | POA: Diagnosis not present

## 2019-07-22 DIAGNOSIS — T45615A Adverse effect of thrombolytic drugs, initial encounter: Secondary | ICD-10-CM | POA: Diagnosis not present

## 2019-07-22 DIAGNOSIS — Z79899 Other long term (current) drug therapy: Secondary | ICD-10-CM | POA: Diagnosis not present

## 2019-07-22 DIAGNOSIS — R413 Other amnesia: Secondary | ICD-10-CM | POA: Diagnosis not present

## 2019-07-22 DIAGNOSIS — E669 Obesity, unspecified: Secondary | ICD-10-CM | POA: Diagnosis not present

## 2019-07-22 DIAGNOSIS — R410 Disorientation, unspecified: Secondary | ICD-10-CM | POA: Diagnosis not present

## 2019-07-22 DIAGNOSIS — R7303 Prediabetes: Secondary | ICD-10-CM | POA: Diagnosis not present

## 2019-07-22 DIAGNOSIS — Z87820 Personal history of traumatic brain injury: Secondary | ICD-10-CM | POA: Diagnosis not present

## 2019-07-22 DIAGNOSIS — F17220 Nicotine dependence, chewing tobacco, uncomplicated: Secondary | ICD-10-CM | POA: Diagnosis not present

## 2019-07-22 DIAGNOSIS — R0902 Hypoxemia: Secondary | ICD-10-CM | POA: Diagnosis not present

## 2019-07-22 DIAGNOSIS — R4701 Aphasia: Secondary | ICD-10-CM | POA: Diagnosis not present

## 2019-07-22 DIAGNOSIS — R0689 Other abnormalities of breathing: Secondary | ICD-10-CM | POA: Diagnosis not present

## 2019-07-22 DIAGNOSIS — F40240 Claustrophobia: Secondary | ICD-10-CM | POA: Diagnosis not present

## 2019-07-22 DIAGNOSIS — N189 Chronic kidney disease, unspecified: Secondary | ICD-10-CM | POA: Diagnosis not present

## 2019-07-22 DIAGNOSIS — E785 Hyperlipidemia, unspecified: Secondary | ICD-10-CM | POA: Diagnosis not present

## 2019-07-22 DIAGNOSIS — Z8249 Family history of ischemic heart disease and other diseases of the circulatory system: Secondary | ICD-10-CM | POA: Diagnosis not present

## 2019-07-22 DIAGNOSIS — G4489 Other headache syndrome: Secondary | ICD-10-CM | POA: Diagnosis not present

## 2019-07-22 DIAGNOSIS — I639 Cerebral infarction, unspecified: Secondary | ICD-10-CM | POA: Diagnosis not present

## 2019-07-22 DIAGNOSIS — R456 Violent behavior: Secondary | ICD-10-CM | POA: Diagnosis not present

## 2019-07-22 DIAGNOSIS — R471 Dysarthria and anarthria: Secondary | ICD-10-CM | POA: Diagnosis not present

## 2019-07-22 DIAGNOSIS — R29709 NIHSS score 9: Secondary | ICD-10-CM | POA: Diagnosis not present

## 2019-07-22 DIAGNOSIS — G459 Transient cerebral ischemic attack, unspecified: Secondary | ICD-10-CM | POA: Diagnosis not present

## 2019-07-22 DIAGNOSIS — R29898 Other symptoms and signs involving the musculoskeletal system: Secondary | ICD-10-CM | POA: Diagnosis not present

## 2019-07-22 DIAGNOSIS — Y33XXXA Other specified events, undetermined intent, initial encounter: Secondary | ICD-10-CM | POA: Diagnosis not present

## 2019-07-22 DIAGNOSIS — T783XXA Angioneurotic edema, initial encounter: Secondary | ICD-10-CM | POA: Diagnosis not present

## 2019-07-22 DIAGNOSIS — R29810 Facial weakness: Secondary | ICD-10-CM | POA: Diagnosis not present

## 2019-07-22 DIAGNOSIS — K219 Gastro-esophageal reflux disease without esophagitis: Secondary | ICD-10-CM | POA: Diagnosis not present

## 2019-07-22 DIAGNOSIS — R251 Tremor, unspecified: Secondary | ICD-10-CM | POA: Diagnosis not present

## 2019-07-22 DIAGNOSIS — S199XXA Unspecified injury of neck, initial encounter: Secondary | ICD-10-CM | POA: Diagnosis not present

## 2019-07-22 DIAGNOSIS — Z20822 Contact with and (suspected) exposure to covid-19: Secondary | ICD-10-CM | POA: Diagnosis not present

## 2019-07-22 DIAGNOSIS — F419 Anxiety disorder, unspecified: Secondary | ICD-10-CM | POA: Diagnosis not present

## 2019-07-22 DIAGNOSIS — R29818 Other symptoms and signs involving the nervous system: Secondary | ICD-10-CM | POA: Diagnosis not present

## 2019-07-22 DIAGNOSIS — B370 Candidal stomatitis: Secondary | ICD-10-CM | POA: Diagnosis not present

## 2019-07-24 DIAGNOSIS — I639 Cerebral infarction, unspecified: Secondary | ICD-10-CM | POA: Diagnosis not present

## 2019-07-25 DIAGNOSIS — I69351 Hemiplegia and hemiparesis following cerebral infarction affecting right dominant side: Secondary | ICD-10-CM | POA: Diagnosis not present

## 2019-07-25 DIAGNOSIS — G609 Hereditary and idiopathic neuropathy, unspecified: Secondary | ICD-10-CM | POA: Diagnosis not present

## 2019-07-25 DIAGNOSIS — M5020 Other cervical disc displacement, unspecified cervical region: Secondary | ICD-10-CM | POA: Diagnosis not present

## 2019-07-25 DIAGNOSIS — E669 Obesity, unspecified: Secondary | ICD-10-CM | POA: Diagnosis not present

## 2019-07-25 DIAGNOSIS — R7303 Prediabetes: Secondary | ICD-10-CM | POA: Diagnosis not present

## 2019-07-25 DIAGNOSIS — I69320 Aphasia following cerebral infarction: Secondary | ICD-10-CM | POA: Diagnosis not present

## 2019-08-01 DIAGNOSIS — I69320 Aphasia following cerebral infarction: Secondary | ICD-10-CM | POA: Diagnosis not present

## 2019-08-01 DIAGNOSIS — G609 Hereditary and idiopathic neuropathy, unspecified: Secondary | ICD-10-CM | POA: Diagnosis not present

## 2019-08-01 DIAGNOSIS — E669 Obesity, unspecified: Secondary | ICD-10-CM | POA: Diagnosis not present

## 2019-08-01 DIAGNOSIS — I69351 Hemiplegia and hemiparesis following cerebral infarction affecting right dominant side: Secondary | ICD-10-CM | POA: Diagnosis not present

## 2019-08-01 DIAGNOSIS — R7303 Prediabetes: Secondary | ICD-10-CM | POA: Diagnosis not present

## 2019-08-01 DIAGNOSIS — M5020 Other cervical disc displacement, unspecified cervical region: Secondary | ICD-10-CM | POA: Diagnosis not present

## 2019-08-02 DIAGNOSIS — G459 Transient cerebral ischemic attack, unspecified: Secondary | ICD-10-CM | POA: Diagnosis not present

## 2019-08-02 DIAGNOSIS — R569 Unspecified convulsions: Secondary | ICD-10-CM | POA: Diagnosis not present

## 2019-08-07 ENCOUNTER — Telehealth: Payer: Self-pay | Admitting: Family Medicine

## 2019-08-07 NOTE — Telephone Encounter (Signed)
Copied from [place] [id number] [phone]. Topic: General - Other >> Aug 07, 2019  2:19 PM Keene Breath wrote: Reason for CRM: Verbal for PT - 2xwk for 2wks, ant OT evaluation and speech therapy 1xwk for 1 wk.  CB# [phone]

## 2019-08-07 NOTE — Telephone Encounter (Signed)
Verbal okay given.  

## 2019-08-11 ENCOUNTER — Ambulatory Visit: Payer: Self-pay

## 2019-08-11 ENCOUNTER — Encounter: Payer: Self-pay | Admitting: Family Medicine

## 2019-08-11 ENCOUNTER — Ambulatory Visit (INDEPENDENT_AMBULATORY_CARE_PROVIDER_SITE_OTHER): Payer: PPO | Admitting: Family Medicine

## 2019-08-11 VITALS — BP 132/62 | HR 65

## 2019-08-11 DIAGNOSIS — J0110 Acute frontal sinusitis, unspecified: Secondary | ICD-10-CM

## 2019-08-11 DIAGNOSIS — Z8673 Personal history of transient ischemic attack (TIA), and cerebral infarction without residual deficits: Secondary | ICD-10-CM | POA: Diagnosis not present

## 2019-08-11 MED ORDER — AMOXICILLIN-POT CLAVULANATE 875-125 MG PO TABS: 1.0000 | ORAL_TABLET | Freq: Two times a day (BID) | ORAL | 0 refills | Status: AC

## 2019-08-11 MED ORDER — NYSTATIN 100000 UNIT/ML MT SUSP: 5.0000 mL | Freq: Four times a day (QID) | OROMUCOSAL | 0 refills | Status: DC

## 2019-08-11 NOTE — Telephone Encounter (Signed)
Pt. Reports he had a sinus infection in January and took Z-pack. Was hospitalized that same month with a CVA. Continues to have sinus pressure and headache - temples,forehead and around his eyes. Has blood when he blows his nose. Feels "out of sorts. I know where I am and I'm oriented, I just feel funny." PT came today and worked with him without difficulty."My vital signs were good." Functioning and moving around his home normally. Feels "flushed sometimes." "I usually need Augmentin to get over a sinus infection." Virtual visit made for today. No availability with his provider.  Reason for Disposition . [1] Sinus pain (not just congestion) AND [2] fever  Answer Assessment - Initial Assessment Questions 1. LOCATION: "Where does it hurt?"      Temples hurt and forehead, under eyes 2. ONSET: "When did the sinus pain start?"  (e.g., hours, days)      Started in Jan. 3. SEVERITY: "How bad is the pain?"   (Scale 1-10; mild, moderate or severe)   - MILD (1-3): doesn't interfere with normal activities    - MODERATE (4-7): interferes with normal activities (e.g., work or school) or awakens from sleep   - SEVERE (8-10): excruciating pain and patient unable to do any normal activities        No pain, more pressure 4. RECURRENT SYMPTOM: "Have you ever had sinus problems before?" If so, ask: "When was the last time?" and "What happened that time?"      Yes 5. NASAL CONGESTION: "Is the nose blocked?" If so, ask, "Can you open it or must you breathe through the mouth?"     Blowing out blood 6. NASAL DISCHARGE: "Do you have discharge from your nose?" If so ask, "What color?"     Bloody 7. FEVER: "Do you have a fever?" If so, ask: "What is it, how was it measured, and when did it start?"      No 8. OTHER SYMPTOMS: "Do you have any other symptoms?" (e.g., sore throat, cough, earache, difficulty breathing)     Left ear feels full 9. PREGNANCY: "Is there any chance you are pregnant?" "When was your last  menstrual period?"     n/a  Protocols used: SINUS PAIN OR CONGESTION-A-AH

## 2019-08-11 NOTE — Progress Notes (Addendum)
Patient: Randy Dean Male    DOB: 1966-02-27   54 y.o.   MRN: [id number] Visit Date: 08/11/2019  Today's Provider: Lelon Huh, MD   Chief Complaint  Patient presents with  . Sinus Problem   Subjective:    Virtual Visit via Telephone Note  I connected with Randy Dean on 08/11/19 at  3:20 PM EST by telephone and verified that I am speaking with the correct person using two identifiers.  Location: Patient: Home Provider: [facility]   I discussed the limitations, risks, security and privacy concerns of performing an evaluation and management service by telephone and the availability of in person appointments. I also discussed with the patient that there may be a patient responsible charge related to this service. The patient expressed understanding and agreed to proceed.  Sinus Problem This is a new problem. The current episode started in the past 7 days. The problem has been gradually worsening since onset. Maximum temperature: low grade per patient report 98.1. Associated symptoms include chills, headaches (earlier this week; improved), shortness of breath, sinus pressure and sneezing. Pertinent negatives include no coughing or sore throat. (Also eye pain and right ear feels stopped up) Treatments tried: Flonase, Claritin. The treatment provided no relief.  He was admitted to [facility] on 1/23 when he presented with symptoms of right-sided weakness, dysarthria, expressive aphasia and receptive aphasia. He was treated for suspected TIA and evolving CVA with TPA, which was aborted after developing facial flushing and tongue swelling. Neurological symptoms resolved within a day.  He was discharged on on 81mg  ECASA daily and atorvastatin 40mg  daily. He has since followed up with Dr. Manuella Ghazi.   Is is now having some dizziness and blood nasal discharge. Feeling light headed with pressure in frontal sinuses.  BP 120s to 140s. No focal neurological sx. Was prescribed  Zpack for sinusitis just prior to admission which he states doesn't usually work well for him.  States Augmentin and medication    Allergies  Allergen Reactions  . Serzone [Nefazodone]     Tongue and facial swelling  . Codeine Nausea Only    Dizziness, sweating  . Doxycycline Other (See Comments)    Severe headaches  . Effexor [Venlafaxine]     "Feels out of it"  . Paxil [Paroxetine Hcl]     "does not feel right in the head"  . Esomeprazole Sodium Rash     Current Outpatient Medications:  .  acetaminophen (TYLENOL) 500 MG tablet, Take 500 mg by mouth every 6 (six) hours as needed for mild pain. , Disp: , Rfl:  .  ALPRAZolam (XANAX) 0.5 MG tablet, Take 2 tablets approximately 45 minutes prior to the MRI study, take a third tablet if needed., Disp: 3 tablet, Rfl: 0 .  aspirin EC 81 MG tablet, Take 81 mg by mouth daily., Disp: , Rfl:  .  atorvastatin (LIPITOR) 40 MG tablet, Take 1 tablet by mouth daily., Disp: , Rfl:  .  busPIRone (BUSPAR) 10 MG tablet, Take 10 mg by mouth 2 (two) times daily., Disp: , Rfl:  .  dexlansoprazole (DEXILANT) 60 MG capsule, Take 1 capsule (60 mg total) by mouth daily., Disp: 30 capsule, Rfl: 11 .  diazepam (VALIUM) 5 MG tablet, Take 1 tablet (5 mg total) by mouth See admin instructions. 1 PO 1 hour prior to procedure, may repeat if needed, Disp: 3 tablet, Rfl: 0 .  fluticasone (FLONASE) 50 MCG/ACT nasal spray, Place 2 sprays into  both nostrils 2 (two) times daily., Disp: 96 g, Rfl: 0 .  lansoprazole (PREVACID) 30 MG capsule, Take 30 mg by mouth daily at 12 noon., Disp: , Rfl:  .  loratadine (CLARITIN) 10 MG tablet, Take 10 mg by mouth daily., Disp: , Rfl:  .  albuterol (PROVENTIL HFA) 108 (90 Base) MCG/ACT inhaler, Inhale 1 puff into the lungs every 4 (four) hours as needed for wheezing or shortness of breath. (Patient not taking: Reported on 08/11/2019), Disp: 18 g, Rfl: 11 .  cyclobenzaprine (FLEXERIL) 10 MG tablet, Take 1 tablet, one hour before bedtime  (Patient not taking: Reported on 08/11/2019), Disp: 30 tablet, Rfl: 5 .  gabapentin (NEURONTIN) 100 MG capsule, Take 300 mg by mouth daily. , Disp: , Rfl:  .  magnesium oxide (MAG-OX) 400 (241.3 Mg) MG tablet, Take 1 tablet (400 mg total) by mouth 2 (two) times daily. (Patient not taking: Reported on 08/11/2019), Disp: 60 tablet, Rfl: 5 .  MECLIZINE HCL PO, Take by mouth., Disp: , Rfl:  .  Multiple Vitamin (MULTIVITAMIN) capsule, Take 1 capsule by mouth daily., Disp: , Rfl:  .  nystatin (MYCOSTATIN) 100000 UNIT/ML suspension, Take 5 mLs (500,000 Units total) by mouth 4 (four) times daily., Disp: 60 mL, Rfl: 0 .  oxyCODONE (OXY IR/ROXICODONE) 5 MG immediate release tablet, Take 1 tablet (5 mg total) by mouth every 6 (six) hours as needed for severe pain. (Patient not taking: Reported on 08/11/2019), Disp: 30 tablet, Rfl: 0 .  sertraline (ZOLOFT) 100 MG tablet, Take 1 tablet (100 mg total) by mouth daily. (Patient not taking: Reported on 08/11/2019), Disp: 90 tablet, Rfl: 3  Review of Systems  Constitutional: Positive for chills. Negative for appetite change and fever.  HENT: Positive for sinus pressure, sinus pain and sneezing. Negative for rhinorrhea and sore throat.        Right ear feels stopped up  Eyes: Positive for pain.  Respiratory: Positive for shortness of breath. Negative for cough, chest tightness and wheezing.   Cardiovascular: Negative for chest pain and palpitations.  Gastrointestinal: Positive for nausea (resolved). Negative for abdominal pain and vomiting.  Neurological: Positive for dizziness and headaches (earlier this week; improved).       Feels "loopy"    Social History   Tobacco Use  . Smoking status: Former Smoker    Packs/day: 0.50    Years: 3.00    Pack years: 1.50    Quit date: 06/28/1984    Years since quitting: 35.1  . Smokeless tobacco: Current User    Types: Snuff    Last attempt to quit: 06/29/1986  . Tobacco comment: 1 can dip/day  Substance Use Topics  .  Alcohol use: No    Alcohol/week: 0.0 standard drinks      Objective:   BP 132/62   Pulse 65  Vitals:   08/11/19 1120  BP: 132/62  Pulse: 65  There is no height or weight on file to calculate BMI.   Physical Exam  Awake, alert, oriented x 3. In no apparent distress  No results found for any visits on 08/11/19.     Assessment & Plan    1. Acute frontal sinusitis, recurrence not specified  - nystatin (MYCOSTATIN) 100000 UNIT/ML suspension; Take 5 mLs (500,000 Units total) by mouth 4 (four) times daily.  Dispense: 60 mL; Refill: 0 - amoxicillin-clavulanate (AUGMENTIN) 875-125 MG tablet; Take 1 tablet by mouth 2 (two) times daily for 7 days.  Dispense: 14 tablet; Refill: 0  2. History  of TIA (transient ischemic attack) Presented [facility] 07/22/2019. Aborted tpA due to possible anaphylactic reaction. Vascular imaging unremarkable. Has since had follow up with Dr. Manuella Ghazi.   Patient inquired about cutting back on dose of atorvastatin. His current sx are not c/w statin side effects. However it's still not clear if his neurological episode last month was vascular in nature. I advised him to continue current dose of  atorvastatin for the time being and discuss further with Dr. Manuella Ghazi and Dr Rosanna Randy at his follow up visit.   - atorvastatin (LIPITOR) 40 MG tablet; Take 1 tablet by mouth daily. - lansoprazole (PREVACID) 30 MG capsule; Take 30 mg by mouth daily at 12 noon. - busPIRone (BUSPAR) 10 MG tablet; Take 10 mg by mouth 2 (two) times daily.  The entirety of the information documented in the History of Present Illness, Review of Systems and Physical Exam were personally obtained by me. Portions of this information were initially documented by Meyer Cory, CMA and reviewed by me for thoroughness and accuracy.     I discussed the assessment and treatment plan with the patient. The patient was provided an opportunity to ask questions and all were answered. The patient agreed with the plan and  demonstrated an understanding of the instructions.   The patient was advised to call back or seek an in-person evaluation if the symptoms worsen or if the condition fails to improve as anticipated.  I provided 18 minutes of non-face-to-face time during this encounter.      Lelon Huh, MD  [facility]

## 2019-08-11 NOTE — Addendum Note (Signed)
Addended by: Birdie Sons on: 08/11/2019 05:31 PM   Modules accepted: Level of Service

## 2019-08-14 ENCOUNTER — Telehealth: Payer: Self-pay | Admitting: Family Medicine

## 2019-08-14 NOTE — Telephone Encounter (Signed)
Please advise 

## 2019-08-14 NOTE — Telephone Encounter (Signed)
Pt was prescribed atorvastatin (LIPITOR) 40 MG tablet From [facility] due to having a stroke and it has been causing leg muscle pain and they advised him to contact PCP to see if they can prescribed an alternate medication for his cholesterol that wont have these side effects / please advise

## 2019-08-15 NOTE — Telephone Encounter (Signed)
LMOVM for pt to return call 

## 2019-08-15 NOTE — Telephone Encounter (Signed)
Advised 

## 2019-08-15 NOTE — Telephone Encounter (Signed)
Stop atorvastatin and in 2 weeks if improved start rosuvastatin 10mg  daily.

## 2019-08-22 DIAGNOSIS — I639 Cerebral infarction, unspecified: Secondary | ICD-10-CM | POA: Diagnosis not present

## 2019-10-10 NOTE — Progress Notes (Signed)
Established patient visit      I,[name],acting as a scribe for [name], MD.,have documented all relevant documentation on the behalf of [name], MD,as directed by  [name], MD while in the presence of [name], MD.  Patient: Randy Dean   DOB: [birth year]   54 y.o. Male  MRN: [id number] Visit Date: 10/11/2019  Today's healthcare provider: [name], MD  Subjective:    Chief Complaint  Patient presents with  . Follow-up  . Anxiety   HPI  Patient is in today for follow-up for multiple issues.  He was in the hospital in January for TIA versus functional reaction.  He had memory loss for about 3 days and had expressive and receptive aphasia along with acute mental status changes.  This all resolved.  He did receive a dose of TPA.  This was stopped due to potential anaphylaxis.  Since then all of this is resolved.  Some of his neurologic symptoms that he has had for several years are actually improved.  His tremor is better, his walking is better.  His speech is little better. He states his anxiety is improved.  He states he had knee pain with the atorvastatin and stopped it and within 2 weeks the knee pain was gone. Anxiety From 05/01/2019-Stable.  Patient Active Problem List   Diagnosis Date Noted  . History of TIA (transient ischemic attack) 08/11/2019  . Diverticulitis large intestine 04/11/2018  . Prediabetes 10/21/2016  . Abnormality of gait 10/30/2015  . Headache 10/30/2015  . Anxiety and depression 11/23/2014  . Brain injuries ([place]) 11/23/2014  . Clinical depression 11/23/2014  . HLD (hyperlipidemia) 11/23/2014  . Insomnia due to medical condition 11/23/2014  . Injury of face and neck 11/23/2014  . Cervical pain 11/23/2014  . Loss of feeling or sensation 11/23/2014  . Tingling 11/23/2014  . Adiposity 11/23/2014  . Allergic rhinitis, seasonal 11/23/2014  . Neurosis, posttraumatic 11/23/2014  . Spinal  stenosis in cervical region 11/23/2014  . Injury brain, traumatic ([place]) 11/23/2014  . Has a tremor 11/23/2014  . Disease of vein 11/23/2014  . Head revolving around 11/23/2014  . Shortness of breath 03/14/2013  . Anxiety 10/11/2008  . Intervertebral cervical disc disorder with myelopathy, cervical region 10/11/2008  . Musculoskeletal disorder and symptoms referable to neck 10/11/2008  . Acid reflux 10/11/2008       Medications: Outpatient Medications Prior to Visit  Medication Sig  . acetaminophen (TYLENOL) 500 MG tablet Take 500 mg by mouth every 6 (six) hours as needed for mild pain.   Marland Kitchen ALPRAZolam (XANAX) 0.5 MG tablet Take 2 tablets approximately 45 minutes prior to the MRI study, take a third tablet if needed.  Marland Kitchen aspirin EC 81 MG tablet Take 81 mg by mouth daily.  . busPIRone (BUSPAR) 10 MG tablet Take 10 mg by mouth 2 (two) times daily.  Marland Kitchen dexlansoprazole (DEXILANT) 60 MG capsule Take 1 capsule (60 mg total) by mouth daily.  . diazepam (VALIUM) 5 MG tablet Take 1 tablet (5 mg total) by mouth See admin instructions. 1 PO 1 hour prior to procedure, may repeat if needed  . fluticasone (FLONASE) 50 MCG/ACT nasal spray Place 2 sprays into both nostrils 2 (two) times daily.  Marland Kitchen gabapentin (NEURONTIN) 100 MG capsule Take 300 mg by mouth daily.   . lansoprazole (PREVACID) 30 MG capsule Take 30 mg by mouth daily at 12 noon.  . loratadine (CLARITIN) 10 MG tablet Take  10 mg by mouth daily.  . MECLIZINE HCL PO Take by mouth.  . Multiple Vitamin (MULTIVITAMIN) capsule Take 1 capsule by mouth daily.  Marland Kitchen nystatin (MYCOSTATIN) 100000 UNIT/ML suspension Take 5 mLs (500,000 Units total) by mouth 4 (four) times daily.  Marland Kitchen albuterol (PROVENTIL HFA) 108 (90 Base) MCG/ACT inhaler Inhale 1 puff into the lungs every 4 (four) hours as needed for wheezing or shortness of breath. (Patient not taking: Reported on 08/11/2019)  . atorvastatin (LIPITOR) 40 MG tablet Take 1 tablet by mouth daily.  . cyclobenzaprine  (FLEXERIL) 10 MG tablet Take 1 tablet, one hour before bedtime (Patient not taking: Reported on 08/11/2019)  . magnesium oxide (MAG-OX) 400 (241.3 Mg) MG tablet Take 1 tablet (400 mg total) by mouth 2 (two) times daily. (Patient not taking: Reported on 08/11/2019)  . oxyCODONE (OXY IR/ROXICODONE) 5 MG immediate release tablet Take 1 tablet (5 mg total) by mouth every 6 (six) hours as needed for severe pain. (Patient not taking: Reported on 08/11/2019)  . sertraline (ZOLOFT) 100 MG tablet Take 1 tablet (100 mg total) by mouth daily. (Patient not taking: Reported on 08/11/2019)   No facility-administered medications prior to visit.    Review of Systems  Constitutional: Negative for appetite change, chills and fever.  HENT: Negative.   Eyes: Negative.   Respiratory: Negative for chest tightness, shortness of breath and wheezing.   Cardiovascular: Negative for chest pain and palpitations.  Gastrointestinal: Negative for abdominal pain, nausea and vomiting.  Endocrine: Negative.   Musculoskeletal: Positive for arthralgias.  Allergic/Immunologic: Negative.   Hematological: Negative.   Psychiatric/Behavioral: Negative.     Last lipids Lab Results  Component Value Date   CHOL 209 (A) 01/31/2014   HDL 35 01/31/2014   LDLCALC 146 01/31/2014   TRIG 142 01/31/2014        Objective:    BP 121/76 (BP Location: Left Arm, Patient Position: Sitting, Cuff Size: Large)   Pulse 62   Temp (!) 97.3 F (36.3 C) (Other (Comment))   Resp 16   Ht 5' 11" (1.803 m)   Wt 226 lb (102.5 kg)   SpO2 97%   BMI 31.52 kg/m  BP Readings from Last 3 Encounters:  10/11/19 121/76  08/11/19 132/62  05/01/19 128/72   Wt Readings from Last 3 Encounters:  10/11/19 226 lb (102.5 kg)  05/01/19 223 lb 9.6 oz (101.4 kg)  04/06/19 216 lb (98 kg)      Physical Exam HENT:     Head: Normocephalic and atraumatic.     Right Ear: External ear normal.     Left Ear: External ear normal.  Eyes:     General: No  scleral icterus.    Conjunctiva/sclera: Conjunctivae normal.  Neck:     Vascular: No carotid bruit.  Cardiovascular:     Rate and Rhythm: Normal rate and regular rhythm.     Heart sounds: Normal heart sounds.  Pulmonary:     Effort: Pulmonary effort is normal.     Breath sounds: Normal breath sounds.  Abdominal:     Palpations: Abdomen is soft.     Tenderness: There is no abdominal tenderness.  Lymphadenopathy:     Cervical: No cervical adenopathy.  Neurological:     Mental Status: He is alert and oriented to person, place, and time. Mental status is at baseline.     Cranial Nerves: No cranial nerve deficit.     Gait: Gait normal.     Comments: Right hand tremor the patient  has had for years has resolved, at least it is not there this morning.  Psychiatric:        Mood and Affect: Mood normal.        Behavior: Behavior normal.        Thought Content: Thought content normal.        Judgment: Judgment normal.       No results found for any visits on 10/11/19.    Assessment & Plan:    1. Anxiety and depression Clinically stable.  No changes. - Sed Rate (ESR) - Comprehensive Metabolic Panel (CMET) - CBC w/Diff/Platelet - Lipid panel - TSH  2. Diverticulitis of large intestine with abscess without bleeding Patient to follow-up with GI.  He states he was noncompliant last year with follow-up because of Covid.  I do not think at this time he has diverticulitis today.  Clinically he has a normal abdominal exam . - Sed Rate (ESR) - Comprehensive Metabolic Panel (CMET) - CBC w/Diff/Platelet - Lipid panel - TSH  3. History of TIA (transient ischemic attack) Patient started on statin.  Check fasting lipids today.  If LDL above 70 we will try rosuvastatin 10 mg daily - Sed Rate (ESR) - Comprehensive Metabolic Panel (CMET) - CBC w/Diff/Platelet - Lipid panel - TSH  4. Seasonal allergic rhinitis due to other allergic trigger  - Sed Rate (ESR) - Comprehensive Metabolic  Panel (CMET) - CBC w/Diff/Platelet - Lipid panel - TSH  5. Nonintractable headache, unspecified chronicity pattern, unspecified headache type Check sed rate to rule out temporal arteritis although unlikely.  He is nontender over the temples and temporal artery.  He has had normal imaging in recent months. - Sed Rate (ESR) - Comprehensive Metabolic Panel (CMET) - CBC w/Diff/Platelet - Lipid panel - TSH  6. Has a tremor Improved. - Sed Rate (ESR) - Comprehensive Metabolic Panel (CMET) - CBC w/Diff/Platelet - Lipid panel - TSH  7. Mixed hyperlipidemia See above conversation regarding TIA - Sed Rate (ESR) - Comprehensive Metabolic Panel (CMET) - CBC w/Diff/Platelet - Lipid panel - TSH       Analese Sovine Cranford Mon, MD  [facility] [phone] (phone) [phone] (fax)  [place]

## 2019-10-11 ENCOUNTER — Other Ambulatory Visit: Payer: Self-pay

## 2019-10-11 ENCOUNTER — Ambulatory Visit (INDEPENDENT_AMBULATORY_CARE_PROVIDER_SITE_OTHER): Payer: PPO | Admitting: Family Medicine

## 2019-10-11 ENCOUNTER — Encounter: Payer: Self-pay | Admitting: Family Medicine

## 2019-10-11 VITALS — BP 121/76 | HR 62 | Temp 97.3°F | Resp 16 | Ht 71.0 in | Wt 226.0 lb

## 2019-10-11 DIAGNOSIS — F329 Major depressive disorder, single episode, unspecified: Secondary | ICD-10-CM | POA: Diagnosis not present

## 2019-10-11 DIAGNOSIS — E782 Mixed hyperlipidemia: Secondary | ICD-10-CM | POA: Diagnosis not present

## 2019-10-11 DIAGNOSIS — Z8673 Personal history of transient ischemic attack (TIA), and cerebral infarction without residual deficits: Secondary | ICD-10-CM

## 2019-10-11 DIAGNOSIS — R519 Headache, unspecified: Secondary | ICD-10-CM | POA: Diagnosis not present

## 2019-10-11 DIAGNOSIS — F419 Anxiety disorder, unspecified: Secondary | ICD-10-CM

## 2019-10-11 DIAGNOSIS — J3089 Other allergic rhinitis: Secondary | ICD-10-CM

## 2019-10-11 DIAGNOSIS — K5720 Diverticulitis of large intestine with perforation and abscess without bleeding: Secondary | ICD-10-CM

## 2019-10-11 DIAGNOSIS — R251 Tremor, unspecified: Secondary | ICD-10-CM | POA: Diagnosis not present

## 2019-10-11 DIAGNOSIS — F32A Anxiety disorder, unspecified: Secondary | ICD-10-CM

## 2019-10-11 NOTE — Patient Instructions (Signed)
Can get shingles vaccine in 1 month.

## 2019-10-12 ENCOUNTER — Telehealth: Payer: Self-pay

## 2019-10-12 LAB — COMPREHENSIVE METABOLIC PANEL
ALT: 21 IU/L (ref 0–44)
AST: 27 IU/L (ref 0–40)
Albumin/Globulin Ratio: 2 (ref 1.2–2.2)
Albumin: 4.1 g/dL (ref 3.8–4.9)
Alkaline Phosphatase: 74 IU/L (ref 39–117)
BUN/Creatinine Ratio: 7 — ABNORMAL LOW (ref 9–20)
BUN: 12 mg/dL (ref 6–24)
Bilirubin Total: 0.6 mg/dL (ref 0.0–1.2)
CO2: 21 mmol/L (ref 20–29)
Calcium: 9 mg/dL (ref 8.7–10.2)
Chloride: 104 mmol/L (ref 96–106)
Creatinine, Ser: 1.67 mg/dL — ABNORMAL HIGH (ref 0.76–1.27)
GFR calc Af Amer: 52 mL/min/{1.73_m2} — ABNORMAL LOW (ref >59)
GFR calc non Af Amer: 45 mL/min/{1.73_m2} — ABNORMAL LOW (ref >59)
Globulin, Total: 2.1 g/dL (ref 1.5–4.5)
Glucose: 89 mg/dL (ref 65–99)
Potassium: 4.3 mmol/L (ref 3.5–5.2)
Sodium: 138 mmol/L (ref 134–144)
Total Protein: 6.2 g/dL (ref 6.0–8.5)

## 2019-10-12 LAB — CBC WITH DIFFERENTIAL/PLATELET
Basophils Absolute: 0 10*3/uL (ref 0.0–0.2)
Basos: 0 %
EOS (ABSOLUTE): 0.2 10*3/uL (ref 0.0–0.4)
Eos: 3 %
Hematocrit: 45.1 % (ref 37.5–51.0)
Hemoglobin: 15.5 g/dL (ref 13.0–17.7)
Immature Grans (Abs): 0 10*3/uL (ref 0.0–0.1)
Immature Granulocytes: 0 %
Lymphocytes Absolute: 1.5 10*3/uL (ref 0.7–3.1)
Lymphs: 29 %
MCH: 31.3 pg (ref 26.6–33.0)
MCHC: 34.4 g/dL (ref 31.5–35.7)
MCV: 91 fL (ref 79–97)
Monocytes Absolute: 0.4 10*3/uL (ref 0.1–0.9)
Monocytes: 9 %
Neutrophils Absolute: 3 10*3/uL (ref 1.4–7.0)
Neutrophils: 59 %
Platelets: 184 10*3/uL (ref 150–450)
RBC: 4.95 x10E6/uL (ref 4.14–5.80)
RDW: 13.2 % (ref 11.6–15.4)
WBC: 5.1 10*3/uL (ref 3.4–10.8)

## 2019-10-12 LAB — LIPID PANEL
Chol/HDL Ratio: 5 ratio (ref 0.0–5.0)
Cholesterol, Total: 185 mg/dL (ref 100–199)
HDL: 37 mg/dL — ABNORMAL LOW (ref >39)
LDL Chol Calc (NIH): 125 mg/dL — ABNORMAL HIGH (ref 0–99)
Triglycerides: 126 mg/dL (ref 0–149)
VLDL Cholesterol Cal: 23 mg/dL (ref 5–40)

## 2019-10-12 LAB — TSH: TSH: 1.46 u[IU]/mL (ref 0.450–4.500)

## 2019-10-12 LAB — SEDIMENTATION RATE: Sed Rate: 14 mm/hr (ref 0–30)

## 2019-10-12 NOTE — Telephone Encounter (Signed)
Please advise lab results.

## 2019-10-12 NOTE — Telephone Encounter (Signed)
Copied from [place] [id number]. Topic: General - Other >> Oct 12, 2019 12:37 PM Yvette Rack wrote: Reason for CRM: Pt requests call back to discuss lab results.

## 2019-11-20 ENCOUNTER — Other Ambulatory Visit: Payer: Self-pay | Admitting: Family Medicine

## 2019-12-01 DIAGNOSIS — Z01818 Encounter for other preprocedural examination: Secondary | ICD-10-CM | POA: Diagnosis not present

## 2019-12-01 DIAGNOSIS — K5909 Other constipation: Secondary | ICD-10-CM | POA: Diagnosis not present

## 2019-12-01 DIAGNOSIS — Z86010 Personal history of colonic polyps: Secondary | ICD-10-CM | POA: Diagnosis not present

## 2019-12-01 DIAGNOSIS — R1313 Dysphagia, pharyngeal phase: Secondary | ICD-10-CM | POA: Diagnosis not present

## 2019-12-01 DIAGNOSIS — K219 Gastro-esophageal reflux disease without esophagitis: Secondary | ICD-10-CM | POA: Diagnosis not present

## 2019-12-06 DIAGNOSIS — Z01818 Encounter for other preprocedural examination: Secondary | ICD-10-CM | POA: Diagnosis not present

## 2019-12-08 DIAGNOSIS — K2960 Other gastritis without bleeding: Secondary | ICD-10-CM | POA: Diagnosis not present

## 2019-12-08 DIAGNOSIS — R1310 Dysphagia, unspecified: Secondary | ICD-10-CM | POA: Diagnosis not present

## 2019-12-08 DIAGNOSIS — K317 Polyp of stomach and duodenum: Secondary | ICD-10-CM | POA: Diagnosis not present

## 2019-12-08 DIAGNOSIS — K3189 Other diseases of stomach and duodenum: Secondary | ICD-10-CM | POA: Diagnosis not present

## 2019-12-08 DIAGNOSIS — K2980 Duodenitis without bleeding: Secondary | ICD-10-CM | POA: Diagnosis not present

## 2019-12-08 DIAGNOSIS — K228 Other specified diseases of esophagus: Secondary | ICD-10-CM | POA: Diagnosis not present

## 2019-12-08 DIAGNOSIS — K2100 Gastro-esophageal reflux disease with esophagitis, without bleeding: Secondary | ICD-10-CM | POA: Diagnosis not present

## 2019-12-08 DIAGNOSIS — K2970 Gastritis, unspecified, without bleeding: Secondary | ICD-10-CM | POA: Diagnosis not present

## 2019-12-21 ENCOUNTER — Encounter: Payer: Self-pay | Admitting: Family Medicine

## 2019-12-21 ENCOUNTER — Other Ambulatory Visit: Payer: Self-pay

## 2019-12-21 ENCOUNTER — Ambulatory Visit (INDEPENDENT_AMBULATORY_CARE_PROVIDER_SITE_OTHER): Payer: PPO | Admitting: Family Medicine

## 2019-12-21 VITALS — BP 115/72 | HR 75 | Temp 97.3°F | Resp 18 | Ht 71.0 in | Wt 222.0 lb

## 2019-12-21 DIAGNOSIS — J019 Acute sinusitis, unspecified: Secondary | ICD-10-CM | POA: Diagnosis not present

## 2019-12-21 DIAGNOSIS — Z1159 Encounter for screening for other viral diseases: Secondary | ICD-10-CM | POA: Diagnosis not present

## 2019-12-21 NOTE — Progress Notes (Signed)
Established patient visit  I,[name],acting as a scribe for [name], MD.,have documented all relevant documentation on the behalf of [name], MD,as directed by  [name], MD while in the presence of [name], MD.   Patient: Randy Dean   DOB: 08/28/1965   54 y.o. Male  MRN: [id number] Visit Date: 12/21/2019  Today's healthcare provider: [name], MD   Chief Complaint  Patient presents with  . Sinusitis   Subjective    Sinusitis This is a new problem. The current episode started in the past 7 days (1 week). The problem has been waxing and waning since onset. There has been no fever. Associated symptoms include chills, congestion, coughing, ear pain, headaches, neck pain, shortness of breath, sinus pressure, sneezing, a sore throat and swollen glands. Pertinent negatives include no diaphoresis or hoarse voice. (Right ear) Past treatments include acetaminophen. The treatment provided mild relief.   Patient has had sinus pressure and congestion for over one week. Patient also has symptoms of chills, cough, ear pain, ear fullness, headaches, sore throat, and shortness of breath. Patient has been treating symptoms with tylenol, Claritin, and Flonase with no relief.  No documented fever.     Medications: Outpatient Medications Prior to Visit  Medication Sig  . acetaminophen (TYLENOL) 500 MG tablet Take 500 mg by mouth every 6 (six) hours as needed for mild pain.   Marland Kitchen ALPRAZolam (XANAX) 0.5 MG tablet Take 2 tablets approximately 45 minutes prior to the MRI study, take a third tablet if needed.  Marland Kitchen aspirin EC 81 MG tablet Take 81 mg by mouth daily.  Marland Kitchen dexlansoprazole (DEXILANT) 60 MG capsule Take 1 capsule (60 mg total) by mouth daily.  . diazepam (VALIUM) 5 MG tablet Take 1 tablet (5 mg total) by mouth See admin instructions. 1 PO 1 hour prior to procedure, may repeat if needed  . fluticasone (FLONASE) 50 MCG/ACT nasal spray  Place 2 sprays into both nostrils 2 (two) times daily.  Marland Kitchen gabapentin (NEURONTIN) 100 MG capsule Take 300 mg by mouth daily.   . lansoprazole (PREVACID) 30 MG capsule Take 30 mg by mouth daily at 12 noon.  . loratadine (CLARITIN) 10 MG tablet Take 10 mg by mouth daily.  Marland Kitchen nystatin (MYCOSTATIN) 100000 UNIT/ML suspension Take 5 mLs (500,000 Units total) by mouth 4 (four) times daily.  Marland Kitchen albuterol (PROVENTIL HFA) 108 (90 Base) MCG/ACT inhaler Inhale 1 puff into the lungs every 4 (four) hours as needed for wheezing or shortness of breath. (Patient not taking: Reported on 08/11/2019)  . atorvastatin (LIPITOR) 40 MG tablet Take 1 tablet by mouth daily. (Patient not taking: Reported on 12/21/2019)  . busPIRone (BUSPAR) 10 MG tablet Take 10 mg by mouth 2 (two) times daily.  . cyclobenzaprine (FLEXERIL) 10 MG tablet Take 1 tablet, one hour before bedtime (Patient not taking: Reported on 08/11/2019)  . magnesium oxide (MAG-OX) 400 (241.3 Mg) MG tablet Take 1 tablet (400 mg total) by mouth 2 (two) times daily. (Patient not taking: Reported on 08/11/2019)  . MECLIZINE HCL PO Take by mouth. (Patient not taking: Reported on 12/21/2019)  . Multiple Vitamin (MULTIVITAMIN) capsule Take 1 capsule by mouth daily. (Patient not taking: Reported on 12/21/2019)  . oxyCODONE (OXY IR/ROXICODONE) 5 MG immediate release tablet Take 1 tablet (5 mg total) by mouth every 6 (six) hours as needed for severe pain. (Patient not taking: Reported on 08/11/2019)  . sertraline (ZOLOFT) 100 MG tablet Take  1 tablet (100 mg total) by mouth daily. (Patient not taking: Reported on 08/11/2019)   No facility-administered medications prior to visit.    Review of Systems  Constitutional: Positive for chills. Negative for diaphoresis.  HENT: Positive for congestion, ear pain, sinus pressure, sneezing and sore throat. Negative for hoarse voice.   Respiratory: Positive for cough and shortness of breath.   Musculoskeletal: Positive for neck pain.   Neurological: Positive for headaches.       Objective    BP 115/72 (BP Location: Right Arm, Patient Position: Sitting, Cuff Size: Large)   Pulse 75   Temp (!) 97.3 F (36.3 C) (Other (Comment))   Resp 18   Ht 5\' 11"  (1.803 m)   Wt 222 lb (100.7 kg)   SpO2 97%   BMI 30.96 kg/m     Physical Exam Vitals reviewed.  HENT:     Head: Normocephalic and atraumatic.     Right Ear: Tympanic membrane and external ear normal.     Left Ear: Tympanic membrane and external ear normal.     Nose: Nose normal.     Mouth/Throat:     Pharynx: Oropharynx is clear.  Eyes:     General: No scleral icterus.    Conjunctiva/sclera: Conjunctivae normal.  Neck:     Vascular: No carotid bruit.  Cardiovascular:     Rate and Rhythm: Normal rate and regular rhythm.     Heart sounds: Normal heart sounds.  Pulmonary:     Effort: Pulmonary effort is normal.     Breath sounds: Normal breath sounds.  Abdominal:     Palpations: Abdomen is soft.     Tenderness: There is no abdominal tenderness.  Lymphadenopathy:     Cervical: No cervical adenopathy.  Skin:    General: Skin is warm and dry.  Neurological:     Mental Status: He is alert and oriented to person, place, and time. Mental status is at baseline.     Cranial Nerves: No cranial nerve deficit.     Gait: Gait normal.     Comments: Right hand tremor the patient has had for years has resolved, at least it is not there this morning.  Psychiatric:        Mood and Affect: Mood normal.        Behavior: Behavior normal.        Thought Content: Thought content normal.        Judgment: Judgment normal.       No results found for any visits on 12/21/19.  Assessment & Plan     1. Acute non-recurrent sinusitis, unspecified location For sinus congestion use Nasal Irrigation as directed.  I think this is related more to allergic rhinitis and bacterial infection.  At this time use nasal irrigation.  Patient to call us back if he worsens.  2. Need  for hepatitis C screening test Also HIV screening.  Follow-up 6 months for physical - Hepatitis C antibody   Return in 4 months (on 04/29/2020) for CPE.      I, [name], MD, have reviewed all documentation for this visit. The documentation on 12/29/19 for the exam, diagnosis, procedures, and orders are all accurate and complete.    Miryam Mcelhinney Cranford Mon, MD  [facility] [phone] (phone) [phone] (fax)  [place]

## 2019-12-21 NOTE — Patient Instructions (Signed)
For sinus congestion use Nasal Irrigation as directed.

## 2019-12-22 ENCOUNTER — Telehealth: Payer: Self-pay

## 2019-12-22 LAB — HEPATITIS C ANTIBODY: Hep C Virus Ab: <0.1 s/co ratio (ref 0.0–0.9)

## 2019-12-22 NOTE — Telephone Encounter (Signed)
-----   Message from Jerrol Banana., MD sent at 12/22/2019 10:26 AM EDT ----- Hepatitis C is normal

## 2019-12-22 NOTE — Telephone Encounter (Signed)
Patient given lab results through [facility] and he had read the providers comments.

## 2019-12-26 ENCOUNTER — Telehealth: Payer: Self-pay | Admitting: Family Medicine

## 2019-12-26 DIAGNOSIS — J019 Acute sinusitis, unspecified: Secondary | ICD-10-CM

## 2019-12-26 MED ORDER — AMOXICILLIN-POT CLAVULANATE 875-125 MG PO TABS: 1.0000 | ORAL_TABLET | Freq: Two times a day (BID) | ORAL | 0 refills | Status: DC

## 2019-12-26 NOTE — Telephone Encounter (Signed)
Patient advised that medication has been sent to pharmacy.  

## 2019-12-26 NOTE — Telephone Encounter (Signed)
Augmentin for 10 days.thx

## 2019-12-26 NOTE — Telephone Encounter (Signed)
Pt called stating that his sinusitis has gotten worse. Pt states that PCP told him to call back if it got worse and he would prescribe an antibiotic. Please advise.

## 2020-01-08 ENCOUNTER — Telehealth: Payer: Self-pay | Admitting: Family Medicine

## 2020-01-08 NOTE — Telephone Encounter (Signed)
Please advise 

## 2020-01-08 NOTE — Telephone Encounter (Signed)
I could see him tomorrow at 4 or we could refer him to ENT.

## 2020-01-08 NOTE — Telephone Encounter (Signed)
Patient was advised. Scheduled Mychart at 4.

## 2020-01-08 NOTE — Telephone Encounter (Signed)
Patient is calling to report that he has been on amoxicillin-clavulanate (AUGMENTIN) 875-125 MG tablet [[id number]]  for 2 weeks. He is still fatigued, and running a low grade fever - 99.5- 99.8, He takes tylenol to bring it down. No chills, and slight cough, headache on right of head,  off and on pain on the rights side of neck.  Patient had a COVID test in June 2021.  Please advise CB- [phone]

## 2020-01-09 ENCOUNTER — Telehealth (INDEPENDENT_AMBULATORY_CARE_PROVIDER_SITE_OTHER): Payer: PPO | Admitting: Family Medicine

## 2020-01-09 ENCOUNTER — Encounter: Payer: Self-pay | Admitting: Family Medicine

## 2020-01-09 VITALS — BP 137/85 | HR 74 | Temp 98.4°F | Wt 216.0 lb

## 2020-01-09 DIAGNOSIS — F329 Major depressive disorder, single episode, unspecified: Secondary | ICD-10-CM

## 2020-01-09 DIAGNOSIS — J321 Chronic frontal sinusitis: Secondary | ICD-10-CM

## 2020-01-09 DIAGNOSIS — J3089 Other allergic rhinitis: Secondary | ICD-10-CM | POA: Diagnosis not present

## 2020-01-09 DIAGNOSIS — F32A Anxiety disorder, unspecified: Secondary | ICD-10-CM

## 2020-01-09 DIAGNOSIS — F419 Anxiety disorder, unspecified: Secondary | ICD-10-CM

## 2020-01-09 MED ORDER — PREDNISONE 10 MG (21) PO TBPK: ORAL_TABLET | ORAL | 1 refills | Status: DC

## 2020-01-09 NOTE — Progress Notes (Addendum)
MyChart Video Visit    Virtual Visit via Video Note   Virtual Visit via Video Note  I connected with Randy Dean on 01/12/20 at  4:00 PM EDT by a video enabled telemedicine application and verified that I am speaking with the correct person using two identifiers.  Location: Patient: Home  Provider: Office   I discussed the limitations of evaluation and management by telemedicine and the availability of in person appointments. The patient expressed understanding and agreed to proceed.  History of Present Illness:    Observations/Objective:   Assessment and Plan:   Follow Up Instructions:    I discussed the assessment and treatment plan with the patient. The patient was provided an opportunity to ask questions and all were answered. The patient agreed with the plan and demonstrated an understanding of the instructions.   The patient was advised to call back or seek an in-person evaluation if the symptoms worsen or if the condition fails to improve as anticipated.  I provided 15 minutes of non-face-to-face time during this encounter.   Richard Cranford Mon, MD  [name], MD   Patient location: Home Provider location: Office   Patient: Randy Dean   DOB: 1965/08/30   54 y.o. Male  MRN: [id number] Visit Date: 01/09/2020  Today's healthcare provider: [name], MD   Chief Complaint  Patient presents with  . URI   Subjective    HPI HPI    URI    Associated symptoms inlclude achiness, congestion, chills, cough, neck pain, shortness of breath, rhinorrhea and tooth pain.  Recent episode started in the past 7 days.  The problem has been unchanged since onset.  The temperature has been with in normal range (99.8).  Patient  is drinking plenty of fluids.  Past hisotry is significant for  occational episodes of bronchitis.  Patient is not a smoker.       Last edited by Dorian Pod, CMA on 01/09/2020  3:30 PM. (History)     Patient has been on Augmentin for 1 week and says that he is still feeling fatigued and tired and sleepy.  He is afebrile but says his temperature gets up to over 98 degrees.  He says this is high for him.  He has no headache or sore throat.  He is status right facial sensitivity at times to touch but no erythema.  This is between the ear and the right eye.  He had no swelling and no wound.  He has an occasional hacking cough during the day on last visit.   Social History   Tobacco Use  . Smoking status: Former Smoker    Packs/day: 0.50    Years: 3.00    Pack years: 1.50    Quit date: 06/28/1984    Years since quitting: 35.5  . Smokeless tobacco: Current User    Types: Snuff    Last attempt to quit: 06/29/1986  . Tobacco comment: 1 can dip/day  Vaping Use  . Vaping Use: Never used  Substance Use Topics  . Alcohol use: No    Alcohol/week: 0.0 standard drinks  . Drug use: No      Medications: Outpatient Medications Prior to Visit  Medication Sig  . acetaminophen (TYLENOL) 500 MG tablet Take 500 mg by mouth every 6 (six) hours as needed for mild pain.   Marland Kitchen amoxicillin-clavulanate (AUGMENTIN) 875-125 MG tablet Take 1 tablet by mouth 2 (two) times daily.  Marland Kitchen aspirin EC 81 MG  tablet Take 81 mg by mouth daily.  . busPIRone (BUSPAR) 10 MG tablet Take 10 mg by mouth as needed.   . cyclobenzaprine (FLEXERIL) 10 MG tablet Take 1 tablet, one hour before bedtime (Patient taking differently: as needed. Take 1 tablet, one hour before bedtime)  . dexlansoprazole (DEXILANT) 60 MG capsule Take 1 capsule (60 mg total) by mouth daily.  . fluticasone (FLONASE) 50 MCG/ACT nasal spray Place 2 sprays into both nostrils 2 (two) times daily.  . lansoprazole (PREVACID) 30 MG capsule Take 30 mg by mouth daily at 12 noon.  . loratadine (CLARITIN) 10 MG tablet Take 10 mg by mouth daily.  . sertraline (ZOLOFT) 100 MG tablet Take 1 tablet (100 mg total) by mouth daily. (Patient taking differently: Take 100 mg by  mouth as needed. )  . albuterol (PROVENTIL HFA) 108 (90 Base) MCG/ACT inhaler Inhale 1 puff into the lungs every 4 (four) hours as needed for wheezing or shortness of breath. (Patient not taking: Reported on 08/11/2019)  . diazepam (VALIUM) 5 MG tablet Take 1 tablet (5 mg total) by mouth See admin instructions. 1 PO 1 hour prior to procedure, may repeat if needed (Patient not taking: Reported on 01/09/2020)  . nystatin (MYCOSTATIN) 100000 UNIT/ML suspension Take 5 mLs (500,000 Units total) by mouth 4 (four) times daily. (Patient not taking: Reported on 01/09/2020)  . [DISCONTINUED] ALPRAZolam (XANAX) 0.5 MG tablet Take 2 tablets approximately 45 minutes prior to the MRI study, take a third tablet if needed.  . [DISCONTINUED] atorvastatin (LIPITOR) 40 MG tablet Take 1 tablet by mouth daily. (Patient not taking: Reported on 12/21/2019)  . [DISCONTINUED] gabapentin (NEURONTIN) 100 MG capsule Take 300 mg by mouth daily.  (Patient not taking: Reported on 01/09/2020)  . [DISCONTINUED] magnesium oxide (MAG-OX) 400 (241.3 Mg) MG tablet Take 1 tablet (400 mg total) by mouth 2 (two) times daily. (Patient not taking: Reported on 08/11/2019)  . [DISCONTINUED] MECLIZINE HCL PO Take by mouth. (Patient not taking: Reported on 12/21/2019)  . [DISCONTINUED] Multiple Vitamin (MULTIVITAMIN) capsule Take 1 capsule by mouth daily. (Patient not taking: Reported on 12/21/2019)  . [DISCONTINUED] oxyCODONE (OXY IR/ROXICODONE) 5 MG immediate release tablet Take 1 tablet (5 mg total) by mouth every 6 (six) hours as needed for severe pain. (Patient not taking: Reported on 08/11/2019)   No facility-administered medications prior to visit.    Review of Systems  Constitutional: Positive for chills and fatigue. Negative for fever.  HENT: Positive for congestion, sinus pressure, sinus pain and voice change. Negative for ear pain and sore throat.   Eyes: Positive for visual disturbance (right).  Respiratory: Positive for choking, shortness  of breath and wheezing.   Cardiovascular: Negative for chest pain and palpitations.       Objective    BP 137/85 (BP Location: Left Arm, Patient Position: Sitting, Cuff Size: Normal)   Pulse 74   Temp 98.4 F (36.9 C)   Wt 216 lb (98 kg)   BMI 30.13 kg/m  BP Readings from Last 3 Encounters:  01/09/20 137/85  12/21/19 115/72  10/11/19 121/76   Wt Readings from Last 3 Encounters:  01/09/20 216 lb (98 kg)  12/21/19 222 lb (100.7 kg)  10/11/19 226 lb (102.5 kg)      Physical Exam     Assessment & Plan     1. Frontal sinusitis, unspecified chronicity I do not think the patient needs any imaging lab work or further antibiotics.  We will finish Augmentin and I will try  prednisone taper and see if this helps with his symptomatology.  May need ENT referral. - predniSONE (STERAPRED UNI-PAK 21 TAB) 10 MG (21) TBPK tablet; Taper 6-5-4-3-2-1  Dispense: 21 tablet; Refill: 1  2. Seasonal allergic rhinitis due to other allergic trigger   3. Anxiety and depression Chronic and clinically stable at this time.  Patient on sertraline.   No follow-ups on file.     I discussed the assessment and treatment plan with the patient. The patient was provided an opportunity to ask questions and all were answered. The patient agreed with the plan and demonstrated an understanding of the instructions.   The patient was advised to call back or seek an in-person evaluation if the symptoms worsen or if the condition fails to improve as anticipated.  I provided 15 minutes of non-face-to-face time during this encounter.    Richard Cranford Mon, MD [facility] [phone] (phone) [phone] (fax)  [place]

## 2020-01-10 ENCOUNTER — Ambulatory Visit: Payer: Self-pay | Admitting: Family Medicine

## 2020-01-25 DIAGNOSIS — J300 Vasomotor rhinitis: Secondary | ICD-10-CM | POA: Diagnosis not present

## 2020-01-25 DIAGNOSIS — R05 Cough: Secondary | ICD-10-CM | POA: Diagnosis not present

## 2020-01-25 DIAGNOSIS — J309 Allergic rhinitis, unspecified: Secondary | ICD-10-CM | POA: Diagnosis not present

## 2020-01-25 DIAGNOSIS — H1045 Other chronic allergic conjunctivitis: Secondary | ICD-10-CM | POA: Diagnosis not present

## 2020-02-06 ENCOUNTER — Telehealth: Payer: Self-pay

## 2020-02-06 DIAGNOSIS — J321 Chronic frontal sinusitis: Secondary | ICD-10-CM

## 2020-02-06 DIAGNOSIS — J329 Chronic sinusitis, unspecified: Secondary | ICD-10-CM

## 2020-02-06 NOTE — Telephone Encounter (Signed)
Copied from [place] [id number]. Topic: General - Inquiry >> Feb 06, 2020  4:27 PM Alease Frame wrote: Reason for CRM: Pt is wanting a call back from [facility] Nurse regarding sinus infection . Pt state she was prescribed meds for issues however it is not working . Please advise

## 2020-02-07 NOTE — Telephone Encounter (Signed)
Returned call to patient. Patient did a virtual visit for sinus infection 01/09/2020. Patient states he completed medications given. However a few days after completing medications, patient states his symptoms came back. Patient states he is still having right sided sinus pain, pressure, congestion, jaw pain, and headache. Patient wants to know what he do next to help with symptoms. Please advise?

## 2020-02-09 NOTE — Telephone Encounter (Signed)
Refer to ENT

## 2020-02-09 NOTE — Telephone Encounter (Signed)
Referral placed to ENT for patient.

## 2020-02-09 NOTE — Addendum Note (Signed)
Addended by: Gerald Stabs on: 02/09/2020 09:16 AM   Modules accepted: Orders

## 2020-02-20 ENCOUNTER — Telehealth: Payer: Self-pay

## 2020-02-20 ENCOUNTER — Other Ambulatory Visit: Payer: Self-pay | Admitting: Physician Assistant

## 2020-02-20 DIAGNOSIS — B370 Candidal stomatitis: Secondary | ICD-10-CM

## 2020-02-20 DIAGNOSIS — L570 Actinic keratosis: Secondary | ICD-10-CM | POA: Diagnosis not present

## 2020-02-20 DIAGNOSIS — L578 Other skin changes due to chronic exposure to nonionizing radiation: Secondary | ICD-10-CM | POA: Diagnosis not present

## 2020-02-20 DIAGNOSIS — B3781 Candidal esophagitis: Secondary | ICD-10-CM

## 2020-02-20 DIAGNOSIS — D239 Other benign neoplasm of skin, unspecified: Secondary | ICD-10-CM | POA: Diagnosis not present

## 2020-02-20 DIAGNOSIS — Z808 Family history of malignant neoplasm of other organs or systems: Secondary | ICD-10-CM | POA: Diagnosis not present

## 2020-02-20 DIAGNOSIS — B379 Candidiasis, unspecified: Secondary | ICD-10-CM

## 2020-02-20 NOTE — Telephone Encounter (Signed)
Requested medication (s) are due for refill today: no  Requested medication (s) are on the active medication list: yes  Last refill: 04/06/2019  Future visit scheduled: yes  Notes to clinic:  Medication not assigned to a protocol, review manually   Requested Prescriptions  Pending Prescriptions Disp Refills   fluconazole (DIFLUCAN) 150 MG tablet [Pharmacy Med Name: FLUCONAZOLE 150 MG TABLET] 1 tablet 0    Sig: Take 1 tablet (150 mg total) by mouth once for 1 dose.      Off-Protocol Failed - 02/20/2020  8:37 AM      Failed - Medication not assigned to a protocol, review manually.      Passed - Valid encounter within last 12 months    Recent Outpatient Visits           1 month ago Frontal sinusitis, unspecified chronicity   [facility] Jerrol Banana., MD   2 months ago Acute non-recurrent sinusitis, unspecified location   [facility] Jerrol Banana., MD   4 months ago Anxiety and depression   [facility] Jerrol Banana., MD   6 months ago Acute frontal sinusitis, recurrence not specified   [facility] Birdie Sons, MD   7 months ago Intervertebral cervical disc disorder with myelopathy, cervical region   [facility] Jerrol Banana., MD       Future Appointments             In 2 months Jerrol Banana., MD [facility] [place]

## 2020-02-20 NOTE — Telephone Encounter (Signed)
Copied from [place] [id number]. Topic: General - Inquiry >> Feb 20, 2020  3:01 PM Greggory Keen D wrote: Reason for CRM: Pt called saying that [place] told him that his request for Diflucan pill was denied.  He said Dr. Rosanna Randy usually gives this to him for thrush for antibiotic.    CB#  [phone]

## 2020-02-26 MED ORDER — FLUCONAZOLE 100 MG PO TABS: 100.0000 mg | ORAL_TABLET | Freq: Every day | ORAL | 0 refills | Status: DC

## 2020-02-26 NOTE — Telephone Encounter (Signed)
Prescription sent to [facility] for patient.

## 2020-02-26 NOTE — Telephone Encounter (Signed)
Diflucan 100 mg daily for 3 days 

## 2020-02-26 NOTE — Telephone Encounter (Signed)
Pt is calling checking on diflucan refill. [place]

## 2020-02-26 NOTE — Addendum Note (Signed)
Addended by: Gerald Stabs on: 02/26/2020 01:39 PM   Modules accepted: Orders

## 2020-04-09 DIAGNOSIS — R1032 Left lower quadrant pain: Secondary | ICD-10-CM | POA: Diagnosis not present

## 2020-04-15 ENCOUNTER — Telehealth (INDEPENDENT_AMBULATORY_CARE_PROVIDER_SITE_OTHER): Payer: PPO | Admitting: Family Medicine

## 2020-04-15 DIAGNOSIS — R5081 Fever presenting with conditions classified elsewhere: Secondary | ICD-10-CM

## 2020-04-15 DIAGNOSIS — J209 Acute bronchitis, unspecified: Secondary | ICD-10-CM

## 2020-04-15 DIAGNOSIS — T50B95A Adverse effect of other viral vaccines, initial encounter: Secondary | ICD-10-CM

## 2020-04-15 DIAGNOSIS — R059 Cough, unspecified: Secondary | ICD-10-CM | POA: Diagnosis not present

## 2020-04-15 DIAGNOSIS — J440 Chronic obstructive pulmonary disease with acute lower respiratory infection: Secondary | ICD-10-CM

## 2020-04-15 DIAGNOSIS — R5383 Other fatigue: Secondary | ICD-10-CM | POA: Diagnosis not present

## 2020-04-15 MED ORDER — PREDNISONE 20 MG PO TABS: 20.0000 mg | ORAL_TABLET | Freq: Every day | ORAL | 0 refills | Status: DC

## 2020-04-15 MED ORDER — AZITHROMYCIN 250 MG PO TABS: ORAL_TABLET | ORAL | 0 refills | Status: DC

## 2020-04-15 NOTE — Progress Notes (Signed)
MyChart Video Visit    Virtual Visit via Video Note   This visit type was conducted due to national recommendations for restrictions regarding the COVID-19 Pandemic (e.g. social distancing) in an effort to limit this patient's exposure and mitigate transmission in our community. This patient is at least at moderate risk for complications without adequate follow up. This format is felt to be most appropriate for this patient at this time. Physical exam was limited by quality of the video and audio technology used for the visit.   Patient location: home  Provider location: office  I discussed the limitations of evaluation and management by telemedicine and the availability of in person appointments. The patient expressed understanding and agreed to proceed.  Patient: Randy Dean   DOB: May 29, 1966   54 y.o. Male  MRN: [id number] Visit Date: 04/15/2020  Today's healthcare provider: [name], MD   No chief complaint on file.  Subjective    HPI  10 days ago had covid booster and flu shot. Had rapid covid test which was negative. He felt very lethargic and tired after the vaccines but then felt better a few days later.  About 4 days ago he developed fatigue low-grade fever with a temp of "99".  Since then he has developed a nonproductive cough and has been taking some Robitussin-DM with some relief.  He still has fatigue.  He also complains that his taste has not been the same for a while.     Medications: Outpatient Medications Prior to Visit  Medication Sig  . acetaminophen (TYLENOL) 500 MG tablet Take 500 mg by mouth every 6 (six) hours as needed for mild pain.   Marland Kitchen albuterol (PROVENTIL HFA) 108 (90 Base) MCG/ACT inhaler Inhale 1 puff into the lungs every 4 (four) hours as needed for wheezing or shortness of breath. (Patient not taking: Reported on 08/11/2019)  . amoxicillin-clavulanate (AUGMENTIN) 875-125 MG tablet Take 1 tablet by mouth 2 (two) times daily.  Marland Kitchen aspirin  EC 81 MG tablet Take 81 mg by mouth daily.  . busPIRone (BUSPAR) 10 MG tablet Take 10 mg by mouth as needed.   . cyclobenzaprine (FLEXERIL) 10 MG tablet Take 1 tablet, one hour before bedtime (Patient taking differently: as needed. Take 1 tablet, one hour before bedtime)  . dexlansoprazole (DEXILANT) 60 MG capsule Take 1 capsule (60 mg total) by mouth daily.  . diazepam (VALIUM) 5 MG tablet Take 1 tablet (5 mg total) by mouth See admin instructions. 1 PO 1 hour prior to procedure, may repeat if needed (Patient not taking: Reported on 01/09/2020)  . fluconazole (DIFLUCAN) 100 MG tablet Take 1 tablet (100 mg total) by mouth daily.  . fluticasone (FLONASE) 50 MCG/ACT nasal spray Place 2 sprays into both nostrils 2 (two) times daily.  . lansoprazole (PREVACID) 30 MG capsule Take 30 mg by mouth daily at 12 noon.  . loratadine (CLARITIN) 10 MG tablet Take 10 mg by mouth daily.  Marland Kitchen nystatin (MYCOSTATIN) 100000 UNIT/ML suspension Take 5 mLs (500,000 Units total) by mouth 4 (four) times daily. (Patient not taking: Reported on 01/09/2020)  . predniSONE (STERAPRED UNI-PAK 21 TAB) 10 MG (21) TBPK tablet Taper 6-5-4-3-2-1  . sertraline (ZOLOFT) 100 MG tablet Take 1 tablet (100 mg total) by mouth daily. (Patient taking differently: Take 100 mg by mouth as needed. )   No facility-administered medications prior to visit.    Review of Systems     Objective    There were no vitals  taken for this visit.    Physical Exam  Spent patient is speaking in complete sentences with no respiratory distress at all.  No audible wheezing.  He answers questions appropriately.   Assessment & Plan     1. Cough After discussion we will be completing get a Covid PCR test.  I think this is simply a viral URI. We will have him get a pulse oximeter and if his pulse ox is below 90 will pursue other work-up. - Novel Coronavirus, NAA ([facility]) - azithromycin (ZITHROMAX) 250 MG tablet; UAD  Dispense: 6 tablet; Refill: 0 -  predniSONE (DELTASONE) 20 MG tablet; Take 1 tablet (20 mg total) by mouth daily with breakfast.  Dispense: 5 tablet; Refill: 0  2. Fever in other diseases   3. Fatigue after COVID-19 vaccination This could be just secondary to the Covid and flu vaccine.  4. Acute bronchitis with COPD ([place]) Treated with Z-Pak although I do not think is completely necessary.  Patient is very concerned they would want some treatment.   No follow-ups on file.     I discussed the assessment and treatment plan with the patient. The patient was provided an opportunity to ask questions and all were answered. The patient agreed with the plan and demonstrated an understanding of the instructions.   The patient was advised to call back or seek an in-person evaluation if the symptoms worsen or if the condition fails to improve as anticipated.  I provided 18 minutes of non-face-to-face time during this encounter.    Chetara Kropp Cranford Mon, MD [facility] [phone] (phone) [phone] (fax)  [place]

## 2020-04-16 ENCOUNTER — Other Ambulatory Visit: Payer: Self-pay | Admitting: Surgery

## 2020-04-16 DIAGNOSIS — R1032 Left lower quadrant pain: Secondary | ICD-10-CM

## 2020-04-16 LAB — NOVEL CORONAVIRUS, NAA: SARS-CoV-2, NAA: NOT DETECTED

## 2020-04-16 LAB — SARS-COV-2, NAA 2 DAY TAT

## 2020-04-19 ENCOUNTER — Telehealth: Payer: Self-pay | Admitting: Family Medicine

## 2020-04-19 NOTE — Telephone Encounter (Signed)
It is possible it could have been flu that he had. If it is flu it should take about 7 days to improve.   Continue the antibiotic and prednisone until completed.

## 2020-04-19 NOTE — Telephone Encounter (Signed)
Please advise 

## 2020-04-19 NOTE — Telephone Encounter (Signed)
Pt called saying he had a phone visit with Dr. Rosanna Randy on the 18th for Covid related symptoms.  He said he was negative but still feels really bad.  He is having fatigue, no throat pain, no fever, a little cough.  Main complaint is fatigue.  He wants to know what else he can do.  He is taking all the medications that Dr. Darnell Level told him to take.  CB#  [phone]

## 2020-04-22 ENCOUNTER — Ambulatory Visit: Payer: PPO

## 2020-04-23 ENCOUNTER — Other Ambulatory Visit: Payer: Self-pay

## 2020-04-23 ENCOUNTER — Ambulatory Visit
Admission: RE | Admit: 2020-04-23 | Discharge: 2020-04-23 | Disposition: A | Discharge status: Home / Self Care | Payer: PPO | Source: Ambulatory Visit | Attending: Surgery | Admitting: Surgery

## 2020-04-23 DIAGNOSIS — K5730 Diverticulosis of large intestine without perforation or abscess without bleeding: Secondary | ICD-10-CM | POA: Diagnosis not present

## 2020-04-23 DIAGNOSIS — R1032 Left lower quadrant pain: Secondary | ICD-10-CM | POA: Insufficient documentation

## 2020-04-23 NOTE — Telephone Encounter (Signed)
Patient advised.

## 2020-04-25 NOTE — Progress Notes (Signed)
Complete physical exam   Patient: Randy Dean   DOB: 08-Feb-1966   54 y.o. Male  MRN: [id number] Visit Date: 04/29/2020  Today's healthcare provider: [name], MD   Chief Complaint  Patient presents with  . Allergic Rhinitis   . Annual Exam   Subjective    Randy Dean is a 54 y.o. male who presents today for a complete physical exam.  He reports consuming a general diet. The patient does not participate in regular exercise at present. He generally feels fairly well. He reports sleeping fairly well. He does have additional problems to discuss today.    Patient also mentions that he is having trouble with his allergies. This is a chronic issue.  He complains of headache that starts in the right temple area and goes around to the occiput. Patient does not smoke but does dip tobacco regularly. He has chronic neuropathic symptoms but is walking better in recent months. He states he has had recent problems with diverticulitis.  He is followed by GI and surgery for this.  Past Medical History:  Diagnosis Date  . Abnormality of gait 10/30/2015  . Allergic rhinitis   . Anxiety   . Chronic headaches    partially sinus/partially brain injury (per pt)  . GERD (gastroesophageal reflux disease)   . Head trauma 2010   has caused anxiety, memory issues and neck problems  . Headache 10/30/2015   Right temporal   . Neck stiffness    metal plate.  mild limitations of side to side and up and dowm movement  . Prediabetes   . PTSD (post-traumatic stress disorder)   . Stroke [facility])    TIA x3- some residual right side weakness  . Vertigo   . Weakness of right side of body    from TIAs   Past Surgical History:  Procedure Laterality Date  . BACK SURGERY    . COLONOSCOPY WITH PROPOFOL N/A 03/02/2019   Procedure: COLONOSCOPY WITH PROPOFOL;  Surgeon: Benjamine Sprague, DO;  Location: [facility] ENDOSCOPY;  Service: General;  Laterality: N/A;  . ENDOSCOPIC TURBINATE REDUCTION Bilateral  08/22/2015   Procedure: ENDOSCOPIC TURBINATE REDUCTION;  Surgeon: Margaretha Sheffield, MD;  Location: [facility];  Service: ENT;  Laterality: Bilateral;  . EYE MUSCLE SURGERY Left    Due to conential left amblyopia  . NASAL SINUS SURGERY  september 2016  . NECK SURGERY     Social History   Socioeconomic History  . Marital status: Married    Spouse name: Lattie Haw  . Number of children: 0  . Years of education: 40  . Highest education level: Not on file  Occupational History  . Not on file  Tobacco Use  . Smoking status: Former Smoker    Packs/day: 0.50    Years: 3.00    Pack years: 1.50    Quit date: 06/28/1984    Years since quitting: 35.8  . Smokeless tobacco: Current User    Types: Snuff    Last attempt to quit: 06/29/1986  . Tobacco comment: 1 can dip/day  Vaping Use  . Vaping Use: Never used  Substance and Sexual Activity  . Alcohol use: No    Alcohol/week: 0.0 standard drinks  . Drug use: No  . Sexual activity: Not on file  Other Topics Concern  . Not on file  Social History Narrative   Lives at home with his wife Lattie Haw   Right-handed   Drinks tea or soda about 6 times per  day   Social Determinants of Health   Financial Resource Strain:   . Difficulty of Paying Living Expenses: Not on file  Food Insecurity:   . Worried About Charity fundraiser in the Last Year: Not on file  . Ran Out of Food in the Last Year: Not on file  Transportation Needs:   . Lack of Transportation (Medical): Not on file  . Lack of Transportation (Non-Medical): Not on file  Physical Activity:   . Days of Exercise per Week: Not on file  . Minutes of Exercise per Session: Not on file  Stress:   . Feeling of Stress : Not on file  Social Connections:   . Frequency of Communication with Friends and Family: Not on file  . Frequency of Social Gatherings with Friends and Family: Not on file  . Attends Religious Services: Not on file  . Active Member of Clubs or Organizations: Not on file  .  Attends Archivist Meetings: Not on file  . Marital Status: Not on file  Intimate Partner Violence:   . Fear of Current or Ex-Partner: Not on file  . Emotionally Abused: Not on file  . Physically Abused: Not on file  . Sexually Abused: Not on file   Family Status  Relation Name Status  . Mother  Alive  . Father  Alive  . MGM  (Not Specified)  . MGF  (Not Specified)  . PGM  (Not Specified)  . PGF  (Not Specified)   Family History  Problem Relation Age of Onset  . Fibromyalgia Mother   . Heart disease Father   . Tremor Father   . Diabetes Maternal Grandmother   . Hypertension Maternal Grandmother   . Hypertension Maternal Grandfather   . Diabetes Paternal Grandmother   . Hypertension Paternal Grandmother   . Hypertension Paternal Grandfather    Allergies  Allergen Reactions  . Serzone [Nefazodone]     Tongue and facial swelling  . Codeine Nausea Only    Dizziness, sweating  . Doxycycline Other (See Comments)    Severe headaches  . Effexor [Venlafaxine]     "Feels out of it"  . Paxil [Paroxetine Hcl]     "does not feel right in the head"  . T-Pa [Alteplase]     Fascial flushing and tongue swelling. At [facility]  . Esomeprazole Sodium Rash    Patient Care Team: Jerrol Banana., MD as PCP - General (Family Medicine) Benjamine Sprague, DO as Consulting Physician (Surgery)   Medications: Outpatient Medications Prior to Visit  Medication Sig  . acetaminophen (TYLENOL) 500 MG tablet Take 500 mg by mouth every 6 (six) hours as needed for mild pain.   Marland Kitchen aspirin EC 81 MG tablet Take 81 mg by mouth daily.  . busPIRone (BUSPAR) 10 MG tablet Take 10 mg by mouth as needed.   . cyclobenzaprine (FLEXERIL) 10 MG tablet Take 1 tablet, one hour before bedtime (Patient taking differently: as needed. Take 1 tablet, one hour before bedtime)  . dexlansoprazole (DEXILANT) 60 MG capsule Take 1 capsule (60 mg total) by mouth daily.  . fluticasone (FLONASE) 50 MCG/ACT nasal spray  Place 2 sprays into both nostrils 2 (two) times daily.  . lansoprazole (PREVACID) 30 MG capsule Take 30 mg by mouth daily at 12 noon.  . loratadine (CLARITIN) 10 MG tablet Take 10 mg by mouth daily.  . sertraline (ZOLOFT) 100 MG tablet Take 1 tablet (100 mg total) by mouth daily. (Patient taking differently:  Take 100 mg by mouth as needed. )  . albuterol (PROVENTIL HFA) 108 (90 Base) MCG/ACT inhaler Inhale 1 puff into the lungs every 4 (four) hours as needed for wheezing or shortness of breath. (Patient not taking: Reported on 08/11/2019)  . amoxicillin-clavulanate (AUGMENTIN) 875-125 MG tablet Take 1 tablet by mouth 2 (two) times daily.  Marland Kitchen azithromycin (ZITHROMAX) 250 MG tablet UAD (Patient not taking: Reported on 04/29/2020)  . diazepam (VALIUM) 5 MG tablet Take 1 tablet (5 mg total) by mouth See admin instructions. 1 PO 1 hour prior to procedure, may repeat if needed (Patient not taking: Reported on 01/09/2020)  . fluconazole (DIFLUCAN) 100 MG tablet Take 1 tablet (100 mg total) by mouth daily.  Marland Kitchen nystatin (MYCOSTATIN) 100000 UNIT/ML suspension Take 5 mLs (500,000 Units total) by mouth 4 (four) times daily. (Patient not taking: Reported on 01/09/2020)  . predniSONE (DELTASONE) 20 MG tablet Take 1 tablet (20 mg total) by mouth daily with breakfast. (Patient not taking: Reported on 04/29/2020)  . predniSONE (STERAPRED UNI-PAK 21 TAB) 10 MG (21) TBPK tablet Taper 6-5-4-3-2-1   No facility-administered medications prior to visit.    Review of Systems  Constitutional: Negative.   HENT: Positive for sneezing.   Eyes: Negative.   Respiratory: Negative.   Cardiovascular: Negative.   Gastrointestinal: Negative.   Endocrine: Negative.   Genitourinary: Negative.   Musculoskeletal: Negative.   Allergic/Immunologic: Positive for environmental allergies.  Neurological: Positive for headaches.  Hematological: Negative.   All other systems reviewed and are negative.      Objective    BP 124/70    Pulse 70   Temp 98.8 F (37.1 C)   Resp 16   Ht 6' (1.829 m)   Wt 225 lb (102.1 kg)   BMI 30.52 kg/m  BP Readings from Last 3 Encounters:  04/29/20 124/70  01/09/20 137/85  12/21/19 115/72   Wt Readings from Last 3 Encounters:  04/29/20 225 lb (102.1 kg)  01/09/20 216 lb (98 kg)  12/21/19 222 lb (100.7 kg)      Physical Exam Vitals reviewed.  HENT:     Head: Normocephalic and atraumatic.     Right Ear: External ear normal.     Left Ear: External ear normal.     Mouth/Throat:     Mouth: Mucous membranes are moist.     Pharynx: Oropharynx is clear.  Eyes:     General: No scleral icterus.    Conjunctiva/sclera: Conjunctivae normal.  Neck:     Vascular: No carotid bruit.  Cardiovascular:     Rate and Rhythm: Normal rate and regular rhythm.     Heart sounds: Normal heart sounds.  Pulmonary:     Effort: Pulmonary effort is normal.     Breath sounds: Normal breath sounds.  Abdominal:     Palpations: Abdomen is soft.     Tenderness: There is no abdominal tenderness.  Genitourinary:    Penis: Normal.      Testes: Normal.  Lymphadenopathy:     Cervical: No cervical adenopathy.  Skin:    General: Skin is warm and dry.  Neurological:     Mental Status: He is alert and oriented to person, place, and time. Mental status is at baseline.     Cranial Nerves: No cranial nerve deficit.     Gait: Gait normal.  Psychiatric:        Mood and Affect: Mood normal.        Behavior: Behavior normal.  Thought Content: Thought content normal.        Judgment: Judgment normal.       Last depression screening scores PHQ 2/9 Scores 04/29/2020 04/18/2018 04/20/2017  PHQ - 2 Score 0 0 2  PHQ- 9 Score - - 17   Last fall risk screening No flowsheet data found. Last Audit-C alcohol use screening Alcohol Use Disorder Test (AUDIT) 04/29/2020  1. How often do you have a drink containing alcohol? 0  2. How many drinks containing alcohol do you have on a typical day when you are  drinking? 0  3. How often do you have six or more drinks on one occasion? 0  AUDIT-C Score 0  Alcohol Brief Interventions/Follow-up AUDIT Score <7 follow-up not indicated   A score of 3 or more in women, and 4 or more in men indicates increased risk for alcohol abuse, EXCEPT if all of the points are from question 1   No results found for any visits on 04/29/20.  Assessment & Plan    Routine Health Maintenance and Physical Exam  Exercise Activities and Dietary recommendations Goals   None     Immunization History  Administered Date(s) Administered  . Influenza Split 03/29/2012  . Influenza,inj,Quad PF,6+ Mos 04/29/2016, 04/20/2017, 04/14/2018, 05/01/2019, 04/05/2020  . Td 01/24/1996  . Tdap 04/27/2013    Health Maintenance  Topic Date Due  . COVID-19 Vaccine (1) Never done  . TETANUS/TDAP  04/28/2023  . COLONOSCOPY  03/01/2029  . INFLUENZA VACCINE  Completed  . Hepatitis C Screening  Completed  . HIV Screening  Completed    Discussed health benefits of physical activity, and encouraged him to engage in regular exercise appropriate for his age and condition.  1. Annual physical exam  - PSA - POCT urinalysis dipstick  2. Nonintractable headache, unspecified chronicity pattern, unspecified headache type I think this is a tension headache.  Try Fiorinal every 8 hours as needed headache.  He is advised to take no more than twice a week. - CBC with Differential/Platelet  3. History of TIA (transient ischemic attack)  - TSH - Comprehensive metabolic panel  4. Neuropathy  - Vitamin B12 - Sedimentation rate - Methylmalonic Acid  5. Mixed hyperlipidemia  - Lipid panel  6. Hyperglycemia  - Hemoglobin A1c  7. Chronic tension-type headache, not intractable See #2   No follow-ups on file.        Eletha Culbertson Cranford Mon, MD  [facility] [phone] (phone) [phone] (fax)  [place]

## 2020-04-29 ENCOUNTER — Encounter: Payer: Self-pay | Admitting: Family Medicine

## 2020-04-29 ENCOUNTER — Other Ambulatory Visit: Payer: Self-pay

## 2020-04-29 ENCOUNTER — Ambulatory Visit (INDEPENDENT_AMBULATORY_CARE_PROVIDER_SITE_OTHER): Payer: PPO | Admitting: Family Medicine

## 2020-04-29 VITALS — BP 124/70 | HR 70 | Temp 98.8°F | Resp 16 | Ht 72.0 in | Wt 225.0 lb

## 2020-04-29 DIAGNOSIS — Z0000 Encounter for general adult medical examination without abnormal findings: Secondary | ICD-10-CM | POA: Diagnosis not present

## 2020-04-29 DIAGNOSIS — R739 Hyperglycemia, unspecified: Secondary | ICD-10-CM

## 2020-04-29 DIAGNOSIS — Z8673 Personal history of transient ischemic attack (TIA), and cerebral infarction without residual deficits: Secondary | ICD-10-CM

## 2020-04-29 DIAGNOSIS — G629 Polyneuropathy, unspecified: Secondary | ICD-10-CM

## 2020-04-29 DIAGNOSIS — E782 Mixed hyperlipidemia: Secondary | ICD-10-CM | POA: Diagnosis not present

## 2020-04-29 DIAGNOSIS — G44229 Chronic tension-type headache, not intractable: Secondary | ICD-10-CM

## 2020-04-29 DIAGNOSIS — R519 Headache, unspecified: Secondary | ICD-10-CM | POA: Diagnosis not present

## 2020-04-29 LAB — POCT URINALYSIS DIPSTICK
Bilirubin: NEGATIVE
Blood, UA: NEGATIVE
Glucose, UA: NEGATIVE
Ketones, UA: NEGATIVE
Leukocytes, UA: NEGATIVE
Nitrite, UA: NEGATIVE
Protein, UA: NEGATIVE
Spec Grav, UA: 1.025 (ref 1.010–1.025)
Urobilinogen, UA: 0.2 E.U./dL
pH, UA: 6.5 (ref 5.0–8.0)

## 2020-04-29 MED ORDER — BUTALBITAL-ASPIRIN-CAFFEINE 50-325-40 MG PO CAPS: 1.0000 | ORAL_CAPSULE | Freq: Two times a day (BID) | ORAL | 3 refills | Status: DC | PRN

## 2020-04-30 DIAGNOSIS — G629 Polyneuropathy, unspecified: Secondary | ICD-10-CM | POA: Diagnosis not present

## 2020-04-30 DIAGNOSIS — R739 Hyperglycemia, unspecified: Secondary | ICD-10-CM | POA: Diagnosis not present

## 2020-04-30 DIAGNOSIS — Z8673 Personal history of transient ischemic attack (TIA), and cerebral infarction without residual deficits: Secondary | ICD-10-CM | POA: Diagnosis not present

## 2020-04-30 DIAGNOSIS — Z0000 Encounter for general adult medical examination without abnormal findings: Secondary | ICD-10-CM | POA: Diagnosis not present

## 2020-04-30 DIAGNOSIS — R519 Headache, unspecified: Secondary | ICD-10-CM | POA: Diagnosis not present

## 2020-04-30 DIAGNOSIS — E782 Mixed hyperlipidemia: Secondary | ICD-10-CM | POA: Diagnosis not present

## 2020-05-02 ENCOUNTER — Telehealth: Payer: Self-pay

## 2020-05-02 NOTE — Telephone Encounter (Signed)
Copied from [place] [id number]. Topic: General - Other >> May 02, 2020  8:51 AM Alanda Slim E wrote: Reason for CRM: Pt called and stated he has seen his lab results on mychart but would like for only Dr. Rosanna Randy or his actual nurse to call him or send him a mychart message explaining the results to him/ please advise

## 2020-05-02 NOTE — Telephone Encounter (Signed)
Please review labs? Thanks! 

## 2020-05-03 LAB — LIPID PANEL
Chol/HDL Ratio: 5.3 ratio — ABNORMAL HIGH (ref 0.0–5.0)
Cholesterol, Total: 180 mg/dL (ref 100–199)
HDL: 34 mg/dL — ABNORMAL LOW (ref >39)
LDL Chol Calc (NIH): 130 mg/dL — ABNORMAL HIGH (ref 0–99)
Triglycerides: 88 mg/dL (ref 0–149)
VLDL Cholesterol Cal: 16 mg/dL (ref 5–40)

## 2020-05-03 LAB — CBC WITH DIFFERENTIAL/PLATELET
Basophils Absolute: 0 10*3/uL (ref 0.0–0.2)
Basos: 1 %
EOS (ABSOLUTE): 0.1 10*3/uL (ref 0.0–0.4)
Eos: 2 %
Hematocrit: 43 % (ref 37.5–51.0)
Hemoglobin: 14.9 g/dL (ref 13.0–17.7)
Immature Grans (Abs): 0 10*3/uL (ref 0.0–0.1)
Immature Granulocytes: 0 %
Lymphocytes Absolute: 1.5 10*3/uL (ref 0.7–3.1)
Lymphs: 30 %
MCH: 31.6 pg (ref 26.6–33.0)
MCHC: 34.7 g/dL (ref 31.5–35.7)
MCV: 91 fL (ref 79–97)
Monocytes Absolute: 0.4 10*3/uL (ref 0.1–0.9)
Monocytes: 9 %
Neutrophils Absolute: 3 10*3/uL (ref 1.4–7.0)
Neutrophils: 58 %
Platelets: 160 10*3/uL (ref 150–450)
RBC: 4.71 x10E6/uL (ref 4.14–5.80)
RDW: 12.6 % (ref 11.6–15.4)
WBC: 5 10*3/uL (ref 3.4–10.8)

## 2020-05-03 LAB — SEDIMENTATION RATE: Sed Rate: 5 mm/hr (ref 0–30)

## 2020-05-03 LAB — METHYLMALONIC ACID, SERUM: Methylmalonic Acid: 273 nmol/L (ref 0–378)

## 2020-05-03 LAB — COMPREHENSIVE METABOLIC PANEL
ALT: 18 IU/L (ref 0–44)
AST: 16 IU/L (ref 0–40)
Albumin/Globulin Ratio: 2 (ref 1.2–2.2)
Albumin: 3.9 g/dL (ref 3.8–4.9)
Alkaline Phosphatase: 73 IU/L (ref 44–121)
BUN/Creatinine Ratio: 9 (ref 9–20)
BUN: 15 mg/dL (ref 6–24)
Bilirubin Total: 0.4 mg/dL (ref 0.0–1.2)
CO2: 22 mmol/L (ref 20–29)
Calcium: 8.9 mg/dL (ref 8.7–10.2)
Chloride: 103 mmol/L (ref 96–106)
Creatinine, Ser: 1.67 mg/dL — ABNORMAL HIGH (ref 0.76–1.27)
GFR calc Af Amer: 52 mL/min/{1.73_m2} — ABNORMAL LOW (ref >59)
GFR calc non Af Amer: 45 mL/min/{1.73_m2} — ABNORMAL LOW (ref >59)
Globulin, Total: 2 g/dL (ref 1.5–4.5)
Glucose: 79 mg/dL (ref 65–99)
Potassium: 4.4 mmol/L (ref 3.5–5.2)
Sodium: 139 mmol/L (ref 134–144)
Total Protein: 5.9 g/dL — ABNORMAL LOW (ref 6.0–8.5)

## 2020-05-03 LAB — HEMOGLOBIN A1C
Est. average glucose Bld gHb Est-mCnc: 128 mg/dL
Hgb A1c MFr Bld: 6.1 % — ABNORMAL HIGH (ref 4.8–5.6)

## 2020-05-03 LAB — VITAMIN B12: Vitamin B-12: 364 pg/mL (ref 232–1245)

## 2020-05-03 LAB — PSA: Prostate Specific Ag, Serum: 2.6 ng/mL (ref 0.0–4.0)

## 2020-05-03 LAB — TSH: TSH: 1.15 u[IU]/mL (ref 0.450–4.500)

## 2020-06-15 ENCOUNTER — Other Ambulatory Visit: Payer: Self-pay | Admitting: Family Medicine

## 2020-06-15 NOTE — Telephone Encounter (Signed)
Requested Prescriptions  Pending Prescriptions Disp Refills  . fluticasone (FLONASE) 50 MCG/ACT nasal spray [Pharmacy Med Name: FLUTICASONE PROPIONATE NS] 96 g 0    Sig: Place 2 sprays into both nostrils 2 (two) times daily.     Ear, Nose, and Throat: Nasal Preparations - Corticosteroids Passed - 06/15/2020  1:06 PM      Passed - Valid encounter within last 12 months    Recent Outpatient Visits          1 month ago Annual physical exam   [facility] Jerrol Banana., MD   2 months ago Cough   [facility] Jerrol Banana., MD   5 months ago Frontal sinusitis, unspecified chronicity   [facility] Jerrol Banana., MD   5 months ago Acute non-recurrent sinusitis, unspecified location   [facility] Jerrol Banana., MD   8 months ago Anxiety and depression   [facility] Jerrol Banana., MD      Future Appointments            In 1 month Jerrol Banana., MD [facility]

## 2020-07-04 ENCOUNTER — Encounter: Payer: Self-pay | Admitting: Family Medicine

## 2020-07-04 ENCOUNTER — Ambulatory Visit (INDEPENDENT_AMBULATORY_CARE_PROVIDER_SITE_OTHER): Payer: Medicare HMO | Admitting: Family Medicine

## 2020-07-04 ENCOUNTER — Other Ambulatory Visit: Payer: Self-pay

## 2020-07-04 VITALS — BP 120/76 | HR 78 | Temp 98.8°F | Resp 16 | Wt 225.0 lb

## 2020-07-04 DIAGNOSIS — R42 Dizziness and giddiness: Secondary | ICD-10-CM | POA: Diagnosis not present

## 2020-07-04 DIAGNOSIS — S069X0S Unspecified intracranial injury without loss of consciousness, sequela: Secondary | ICD-10-CM | POA: Diagnosis not present

## 2020-07-04 DIAGNOSIS — R3916 Straining to void: Secondary | ICD-10-CM

## 2020-07-04 DIAGNOSIS — R569 Unspecified convulsions: Secondary | ICD-10-CM | POA: Diagnosis not present

## 2020-07-04 DIAGNOSIS — N401 Enlarged prostate with lower urinary tract symptoms: Secondary | ICD-10-CM

## 2020-07-04 DIAGNOSIS — F419 Anxiety disorder, unspecified: Secondary | ICD-10-CM

## 2020-07-04 DIAGNOSIS — H9313 Tinnitus, bilateral: Secondary | ICD-10-CM | POA: Diagnosis not present

## 2020-07-04 DIAGNOSIS — K5909 Other constipation: Secondary | ICD-10-CM

## 2020-07-04 DIAGNOSIS — S069X9S Unspecified intracranial injury with loss of consciousness of unspecified duration, sequela: Secondary | ICD-10-CM | POA: Diagnosis not present

## 2020-07-04 DIAGNOSIS — G4486 Cervicogenic headache: Secondary | ICD-10-CM

## 2020-07-04 DIAGNOSIS — R404 Transient alteration of awareness: Secondary | ICD-10-CM | POA: Diagnosis not present

## 2020-07-04 DIAGNOSIS — M5481 Occipital neuralgia: Secondary | ICD-10-CM | POA: Diagnosis not present

## 2020-07-04 DIAGNOSIS — F32A Depression, unspecified: Secondary | ICD-10-CM

## 2020-07-04 DIAGNOSIS — R69 Illness, unspecified: Secondary | ICD-10-CM | POA: Diagnosis not present

## 2020-07-04 LAB — POCT URINALYSIS DIPSTICK
Bilirubin, UA: NEGATIVE
Blood, UA: NEGATIVE
Glucose, UA: NEGATIVE
Ketones, UA: NEGATIVE
Leukocytes, UA: NEGATIVE
Nitrite, UA: NEGATIVE
Protein, UA: POSITIVE — AB
Spec Grav, UA: 1.02 (ref 1.010–1.025)
Urobilinogen, UA: 1 E.U./dL
pH, UA: 6 (ref 5.0–8.0)

## 2020-07-04 NOTE — Patient Instructions (Signed)
-  Try taking Metamucil daily to help with constipation.  -On even days take Nurtec to help with migraine (samples given today)

## 2020-07-04 NOTE — Progress Notes (Signed)
Established patient visit   Patient: Randy Dean   DOB: 1965-07-07   55 y.o. Male  MRN: [id number] Visit Date: 07/04/2020  Today's healthcare provider: [name], MD   No chief complaint on file.  Subjective    Migraine  This is a chronic problem. The current episode started more than 1 month ago (6 mths). The problem occurs constantly. The problem has been waxing and waning. The pain is located in the right unilateral region. The pain does not radiate. The quality of the pain is described as aching, sharp and throbbing. The pain is at a severity of 8/10. Associated symptoms include coughing, dizziness and tinnitus. Pertinent negatives include no blurred vision, ear pain, nausea, photophobia or vomiting. The symptoms are aggravated by exposure to cold air. He has tried acetaminophen for the symptoms. The treatment provided mild relief.    Patient has several complaints in addition to headaches.  The headaches start in the right occiput and are in the right temple and right upper face including his upper teeth.  He is convinced it is related to an old infection but is not willing to get evaluation for it.  He is not willing to get imaging nor does he want to see ear nose and throat.  Either no neurologic symptoms.  He also complains of chronic constipation and chronic low left lower quadrant pain for which she has seen GI and surgery.  Work-up is negative but he is convinced there is a problem with the abdominal discomfort.  No other GI symptoms.  His other complaint is 1 of urinary hesitancy for which she has to strain.  This is been going on for several months.  No hematuria no flank pain , no dysuria.  Depression screen [facility] 2/9 07/04/2020 04/29/2020 04/18/2018 04/20/2017 01/21/2017  Decreased Interest 1 0 0 1 1  Down, Depressed, Hopeless 0 0 0 1 1  PHQ - 2 Score 1 0 0 2 2  Altered sleeping 3 - - 3 3  Tired, decreased energy 3 - - 3 3  Change in appetite 2 - - 2 2   Feeling bad or failure about yourself  0 - - 2 0  Trouble concentrating 3 - - 3 3  Moving slowly or fidgety/restless 1 - - 2 0  Suicidal thoughts 0 - - 0 0  PHQ-9 Score 13 - - 17 13  Difficult doing work/chores Extremely dIfficult - - Very difficult -    Patient Active Problem List   Diagnosis Date Noted  . History of TIA (transient ischemic attack) 08/11/2019  . Diverticulitis large intestine 04/11/2018  . Prediabetes 10/21/2016  . Abnormality of gait 10/30/2015  . Headache 10/30/2015  . Anxiety and depression 11/23/2014  . Brain injuries ([place]) 11/23/2014  . Clinical depression 11/23/2014  . HLD (hyperlipidemia) 11/23/2014  . Insomnia due to medical condition 11/23/2014  . Injury of face and neck 11/23/2014  . Cervical pain 11/23/2014  . Loss of feeling or sensation 11/23/2014  . Tingling 11/23/2014  . Adiposity 11/23/2014  . Allergic rhinitis, seasonal 11/23/2014  . Neurosis, posttraumatic 11/23/2014  . Spinal stenosis in cervical region 11/23/2014  . Injury brain, traumatic ([place]) 11/23/2014  . Has a tremor 11/23/2014  . Disease of vein 11/23/2014  . Head revolving around 11/23/2014  . Shortness of breath 03/14/2013  . Anxiety 10/11/2008  . Intervertebral cervical disc disorder with myelopathy, cervical region 10/11/2008  . Musculoskeletal disorder and symptoms referable to neck 10/11/2008  .  Acid reflux 10/11/2008   Social History   Tobacco Use  . Smoking status: Former Smoker    Packs/day: 0.50    Years: 3.00    Pack years: 1.50    Quit date: 06/28/1984    Years since quitting: 36.0  . Smokeless tobacco: Current User    Types: Snuff    Last attempt to quit: 06/29/1986  . Tobacco comment: 1 can dip/day  Vaping Use  . Vaping Use: Never used  Substance Use Topics  . Alcohol use: No    Alcohol/week: 0.0 standard drinks  . Drug use: No   Allergies  Allergen Reactions  . Serzone [Nefazodone]     Tongue and facial swelling  . Codeine Nausea Only     Dizziness, sweating  . Doxycycline Other (See Comments)    Severe headaches  . Effexor [Venlafaxine]     "Feels out of it"  . Paxil [Paroxetine Hcl]     "does not feel right in the head"  . T-Pa [Alteplase]     Fascial flushing and tongue swelling. [facility]  . Esomeprazole Sodium Rash     Medications: Outpatient Medications Prior to Visit  Medication Sig  . acetaminophen (TYLENOL) 500 MG tablet Take 500 mg by mouth every 6 (six) hours as needed for mild pain.   Marland Kitchen albuterol (PROVENTIL HFA) 108 (90 Base) MCG/ACT inhaler Inhale 1 puff into the lungs every 4 (four) hours as needed for wheezing or shortness of breath.  Marland Kitchen aspirin EC 81 MG tablet Take 81 mg by mouth daily.  . busPIRone (BUSPAR) 10 MG tablet Take 10 mg by mouth as needed.   . cyclobenzaprine (FLEXERIL) 10 MG tablet Take 1 tablet, one hour before bedtime (Patient taking differently: as needed. Take 1 tablet, one hour before bedtime)  . dexlansoprazole (DEXILANT) 60 MG capsule Take 1 capsule (60 mg total) by mouth daily.  . diazepam (VALIUM) 5 MG tablet Take 1 tablet (5 mg total) by mouth See admin instructions. 1 PO 1 hour prior to procedure, may repeat if needed  . fluticasone (FLONASE) 50 MCG/ACT nasal spray Place 2 sprays into both nostrils 2 (two) times daily.  . lansoprazole (PREVACID) 30 MG capsule Take 30 mg by mouth daily at 12 noon.  . loratadine (CLARITIN) 10 MG tablet Take 10 mg by mouth daily.  . sertraline (ZOLOFT) 100 MG tablet Take 1 tablet (100 mg total) by mouth daily. (Patient taking differently: Take 100 mg by mouth as needed.)  . [DISCONTINUED] amoxicillin-clavulanate (AUGMENTIN) 875-125 MG tablet Take 1 tablet by mouth 2 (two) times daily. (Patient not taking: Reported on 07/04/2020)  . [DISCONTINUED] azithromycin (ZITHROMAX) 250 MG tablet UAD (Patient not taking: Reported on 04/29/2020)  . [DISCONTINUED] butalbital-aspirin-caffeine (FIORINAL) 50-325-40 MG capsule Take 1 capsule by mouth 2 (two) times daily as  needed for headache.  . [DISCONTINUED] fluconazole (DIFLUCAN) 100 MG tablet Take 1 tablet (100 mg total) by mouth daily.  . [DISCONTINUED] nystatin (MYCOSTATIN) 100000 UNIT/ML suspension Take 5 mLs (500,000 Units total) by mouth 4 (four) times daily. (Patient not taking: Reported on 01/09/2020)  . [DISCONTINUED] predniSONE (DELTASONE) 20 MG tablet Take 1 tablet (20 mg total) by mouth daily with breakfast. (Patient not taking: Reported on 04/29/2020)  . [DISCONTINUED] predniSONE (STERAPRED UNI-PAK 21 TAB) 10 MG (21) TBPK tablet Taper 6-5-4-3-2-1   No facility-administered medications prior to visit.    Review of Systems  HENT: Positive for congestion and tinnitus. Negative for ear pain.   Eyes: Negative for blurred vision,  photophobia and visual disturbance.  Respiratory: Positive for cough, shortness of breath and wheezing.   Cardiovascular: Negative for palpitations.  Gastrointestinal: Negative for nausea and vomiting.  Neurological: Positive for dizziness and headaches.    Last CBC Lab Results  Component Value Date   WBC 5.0 04/30/2020   HGB 14.9 04/30/2020   HCT 43.0 04/30/2020   MCV 91 04/30/2020   MCH 31.6 04/30/2020   RDW 12.6 04/30/2020   PLT 160 74/05/8785   Last metabolic panel Lab Results  Component Value Date   GLUCOSE 79 04/30/2020   NA 139 04/30/2020   K 4.4 04/30/2020   CL 103 04/30/2020   CO2 22 04/30/2020   BUN 15 04/30/2020   CREATININE 1.67 (H) 04/30/2020   GFRNONAA 45 (L) 04/30/2020   GFRAA 52 (L) 04/30/2020   CALCIUM 8.9 04/30/2020   PHOS 4.0 04/14/2018   PROT 5.9 (L) 04/30/2020   ALBUMIN 3.9 04/30/2020   LABGLOB 2.0 04/30/2020   AGRATIO 2.0 04/30/2020   BILITOT 0.4 04/30/2020   ALKPHOS 73 04/30/2020   AST 16 04/30/2020   ALT 18 04/30/2020   ANIONGAP 7 04/14/2018   Last lipids Lab Results  Component Value Date   CHOL 180 04/30/2020   HDL 34 (L) 04/30/2020   LDLCALC 130 (H) 04/30/2020   TRIG 88 04/30/2020   CHOLHDL 5.3 (H) 04/30/2020       Objective    BP 120/76 (BP Location: Right Arm, Patient Position: Sitting, Cuff Size: Large)   Pulse 78   Temp 98.8 F (37.1 C) (Oral)   Resp 16   Wt 225 lb (102.1 kg)   SpO2 95%   BMI 30.52 kg/m  BP Readings from Last 3 Encounters:  07/04/20 120/76  04/29/20 124/70  01/09/20 137/85   Wt Readings from Last 3 Encounters:  07/04/20 225 lb (102.1 kg)  04/29/20 225 lb (102.1 kg)  01/09/20 216 lb (98 kg)      Physical Exam Vitals reviewed.  HENT:     Head: Normocephalic and atraumatic.     Right Ear: Tympanic membrane and external ear normal.     Left Ear: Tympanic membrane and external ear normal.     Nose: Nose normal.     Mouth/Throat:     Pharynx: Oropharynx is clear.  Eyes:     General: No scleral icterus.    Conjunctiva/sclera: Conjunctivae normal.  Neck:     Vascular: No carotid bruit.  Cardiovascular:     Rate and Rhythm: Normal rate and regular rhythm.     Heart sounds: Normal heart sounds.  Pulmonary:     Effort: Pulmonary effort is normal.     Breath sounds: Normal breath sounds.  Abdominal:     Palpations: Abdomen is soft.     Tenderness: There is no abdominal tenderness.  Lymphadenopathy:     Cervical: No cervical adenopathy.  Skin:    General: Skin is warm and dry.  Neurological:     Mental Status: He is alert and oriented to person, place, and time. Mental status is at baseline.     Cranial Nerves: No cranial nerve deficit.     Gait: Gait normal.     Comments: Chronic strabismus. No new or otherwise focal findings. No temporal artery tenderness.  Psychiatric:        Mood and Affect: Mood normal.        Behavior: Behavior normal.        Thought Content: Thought content normal.  Judgment: Judgment normal.       No results found for any visits on 07/04/20.  Assessment & Plan     1. Cervicogenic headache This could be a migraine headache.  Try Nurtec every other day and follow-up in 2 weeks. I do not think this is temporal  arteritis nor do I think this is sinus problems.  Have recommended he follow-up with ENT which he has not done all the recommended by myself and by Dr. Brigitte Pulse - CBC with Differential/Platelet - Sed Rate (ESR) - C-reactive protein  2. Other constipation Try Metamucil daily.  Has had complete work-up with GI and surgery - POCT urinalysis dipstick  3. Benign prostatic hyperplasia (BPH) with straining on urination Try Flomax daily.  4. Brain injury with loss of consciousness, sequela ([place]) Clinically this is been stable for years.  5. Anxiety and depression Major contributing factors for this patient.    No follow-ups on file.      I, [name], MD, have reviewed all documentation for this visit. The documentation on 07/06/20 for the exam, diagnosis, procedures, and orders are all accurate and complete.    Richard Cranford Mon, MD  [facility] [phone] (phone) [phone] (fax)  [place]

## 2020-07-05 LAB — CBC WITH DIFFERENTIAL/PLATELET
Basophils Absolute: 0 10*3/uL (ref 0.0–0.2)
Basos: 1 %
EOS (ABSOLUTE): 0.1 10*3/uL (ref 0.0–0.4)
Eos: 1 %
Hematocrit: 42.8 % (ref 37.5–51.0)
Hemoglobin: 14.9 g/dL (ref 13.0–17.7)
Immature Grans (Abs): 0 10*3/uL (ref 0.0–0.1)
Immature Granulocytes: 0 %
Lymphocytes Absolute: 1.5 10*3/uL (ref 0.7–3.1)
Lymphs: 24 %
MCH: 31.6 pg (ref 26.6–33.0)
MCHC: 34.8 g/dL (ref 31.5–35.7)
MCV: 91 fL (ref 79–97)
Monocytes Absolute: 0.6 10*3/uL (ref 0.1–0.9)
Monocytes: 10 %
Neutrophils Absolute: 3.9 10*3/uL (ref 1.4–7.0)
Neutrophils: 64 %
Platelets: 195 10*3/uL (ref 150–450)
RBC: 4.71 x10E6/uL (ref 4.14–5.80)
RDW: 11.8 % (ref 11.6–15.4)
WBC: 6.1 10*3/uL (ref 3.4–10.8)

## 2020-07-05 LAB — C-REACTIVE PROTEIN: CRP: 5 mg/L (ref 0–10)

## 2020-07-05 LAB — SEDIMENTATION RATE: Sed Rate: 2 mm/hr (ref 0–30)

## 2020-07-15 ENCOUNTER — Ambulatory Visit: Payer: Self-pay | Admitting: Family Medicine

## 2020-07-25 ENCOUNTER — Encounter: Payer: Self-pay | Admitting: Family Medicine

## 2020-07-25 ENCOUNTER — Ambulatory Visit (INDEPENDENT_AMBULATORY_CARE_PROVIDER_SITE_OTHER): Payer: Medicare HMO | Admitting: Family Medicine

## 2020-07-25 ENCOUNTER — Other Ambulatory Visit: Payer: Self-pay

## 2020-07-25 VITALS — BP 119/75 | HR 64 | Temp 98.8°F | Resp 18 | Ht 72.0 in | Wt 224.0 lb

## 2020-07-25 DIAGNOSIS — F32A Depression, unspecified: Secondary | ICD-10-CM

## 2020-07-25 DIAGNOSIS — G44049 Chronic paroxysmal hemicrania, not intractable: Secondary | ICD-10-CM | POA: Diagnosis not present

## 2020-07-25 DIAGNOSIS — K219 Gastro-esophageal reflux disease without esophagitis: Secondary | ICD-10-CM | POA: Diagnosis not present

## 2020-07-25 DIAGNOSIS — K5720 Diverticulitis of large intestine with perforation and abscess without bleeding: Secondary | ICD-10-CM | POA: Diagnosis not present

## 2020-07-25 DIAGNOSIS — R1032 Left lower quadrant pain: Secondary | ICD-10-CM | POA: Diagnosis not present

## 2020-07-25 DIAGNOSIS — R69 Illness, unspecified: Secondary | ICD-10-CM | POA: Diagnosis not present

## 2020-07-25 DIAGNOSIS — F419 Anxiety disorder, unspecified: Secondary | ICD-10-CM

## 2020-07-25 DIAGNOSIS — G4486 Cervicogenic headache: Secondary | ICD-10-CM | POA: Diagnosis not present

## 2020-07-25 DIAGNOSIS — K5909 Other constipation: Secondary | ICD-10-CM | POA: Diagnosis not present

## 2020-07-25 MED ORDER — METRONIDAZOLE 375 MG PO CAPS: 375.0000 mg | ORAL_CAPSULE | Freq: Two times a day (BID) | ORAL | 0 refills | Status: DC

## 2020-07-25 MED ORDER — DEXLANSOPRAZOLE 60 MG PO CPDR: DELAYED_RELEASE_CAPSULE | ORAL | 11 refills | Status: DC

## 2020-07-25 MED ORDER — BUSPIRONE HCL 15 MG PO TABS: 15.0000 mg | ORAL_TABLET | Freq: Three times a day (TID) | ORAL | 5 refills | Status: DC

## 2020-07-25 MED ORDER — NURTEC 75 MG PO TBDP: 1.0000 | ORAL_TABLET | ORAL | 11 refills | Status: DC

## 2020-07-25 MED ORDER — AMOXICILLIN-POT CLAVULANATE 875-125 MG PO TABS: 1.0000 | ORAL_TABLET | Freq: Two times a day (BID) | ORAL | 0 refills | Status: DC

## 2020-07-25 MED ORDER — FLUTICASONE PROPIONATE 50 MCG/ACT NA SUSP: 2.0000 | Freq: Two times a day (BID) | NASAL | 0 refills | Status: AC

## 2020-07-25 MED ORDER — FAMOTIDINE 40 MG PO TABS: 40.0000 mg | ORAL_TABLET | Freq: Every day | ORAL | 11 refills | Status: DC

## 2020-07-25 NOTE — Progress Notes (Signed)
I,Randy Dean,acting as a scribe for Randy Durie, MD.,have documented all relevant documentation on the behalf of Randy Durie, MD,as directed by  Randy Durie, MD while in the presence of Randy Durie, MD.   Established patient visit   Patient: Randy Dean   DOB: [birth year]   55 y.o. Male  MRN: [id number] Visit Date: 07/25/2020  Today's healthcare provider: [name], MD   Chief Complaint  Patient presents with  . Follow-up  . Headache  . Constipation   Subjective    HPI  Patient has declined MRI and CT scan for these chronic headaches.  He took the Nurtec other every other day and it did seem to help his headaches per patient so this is worth continuing to try. The other issue the patient complains of the day other than the chronic problems are depression and a "short fuse".  He states he is usually patient but is not patient anymore.  Denies any violence.  Denies any homicidal or suicidal thoughts.  Cervicogenic headache From 07/04/2020-This could be a migraine headache.  Try Nurtec every other day and follow-up in 2 weeks. I do not think this is temporal arteritis nor do I think this is sinus problems.  Have recommended he follow-up with ENT which he has not done all the recommended by myself and by Dr. Brigitte Pulse.  Other constipation From 07/04/2020-Try Metamucil daily.  Has had complete work-up with GI and surgery. - POCT urinalysis dipstick He complains of chronic constipation and talks about a bowel movement every other day. He has had the abdominal work-up per GI and surgery.  It feels better when he stretches. For some of the GI complaints in the past Dexilant worked better than anything else. Benign prostatic hyperplasia (BPH) with straining on urination From 07/04/2020-Try Flomax daily.      Medications: Outpatient Medications Prior to Visit  Medication Sig  . acetaminophen (TYLENOL) 500 MG tablet Take 500 mg by mouth every 6  (six) hours as needed for mild pain.   Marland Kitchen albuterol (PROVENTIL HFA) 108 (90 Base) MCG/ACT inhaler Inhale 1 puff into the lungs every 4 (four) hours as needed for wheezing or shortness of breath.  Marland Kitchen aspirin EC 81 MG tablet Take 81 mg by mouth daily.  . busPIRone (BUSPAR) 10 MG tablet Take 10 mg by mouth as needed.   . cyclobenzaprine (FLEXERIL) 10 MG tablet Take 1 tablet, one hour before bedtime (Patient taking differently: as needed. Take 1 tablet, one hour before bedtime)  . dexlansoprazole (DEXILANT) 60 MG capsule Take 1 capsule (60 mg total) by mouth daily.  . diazepam (VALIUM) 5 MG tablet Take 1 tablet (5 mg total) by mouth See admin instructions. 1 PO 1 hour prior to procedure, may repeat if needed  . fluticasone (FLONASE) 50 MCG/ACT nasal spray Place 2 sprays into both nostrils 2 (two) times daily.  . lansoprazole (PREVACID) 30 MG capsule Take 30 mg by mouth daily at 12 noon.  . loratadine (CLARITIN) 10 MG tablet Take 10 mg by mouth daily.  . sertraline (ZOLOFT) 100 MG tablet Take 1 tablet (100 mg total) by mouth daily. (Patient taking differently: Take 100 mg by mouth as needed.)   No facility-administered medications prior to visit.    Review of Systems  Constitutional: Negative for appetite change, chills and fever.  Respiratory: Negative for chest tightness, shortness of breath and wheezing.   Cardiovascular: Negative for chest pain and palpitations.  Gastrointestinal: Negative for  abdominal pain, nausea and vomiting.        Objective    BP 119/75 (BP Location: Right Arm, Patient Position: Sitting, Cuff Size: Large)   Pulse 64   Temp 98.8 F (37.1 C) (Oral)   Resp 18   Ht 6' (1.829 m)   Wt 224 lb (101.6 kg)   SpO2 98%   BMI 30.38 kg/m  BP Readings from Last 3 Encounters:  07/25/20 119/75  07/04/20 120/76  04/29/20 124/70   Wt Readings from Last 3 Encounters:  07/25/20 224 lb (101.6 kg)  07/04/20 225 lb (102.1 kg)  04/29/20 225 lb (102.1 kg)       Physical  Exam Vitals reviewed.  HENT:     Head: Normocephalic and atraumatic.     Right Ear: Tympanic membrane and external ear normal.     Left Ear: Tympanic membrane and external ear normal.     Nose: Nose normal.     Mouth/Throat:     Pharynx: Oropharynx is clear.  Eyes:     General: No scleral icterus.    Conjunctiva/sclera: Conjunctivae normal.  Neck:     Vascular: No carotid bruit.  Cardiovascular:     Rate and Rhythm: Normal rate and regular rhythm.     Heart sounds: Normal heart sounds.  Pulmonary:     Effort: Pulmonary effort is normal.     Breath sounds: Normal breath sounds.  Abdominal:     Palpations: Abdomen is soft.     Tenderness: There is no abdominal tenderness.  Lymphadenopathy:     Cervical: No cervical adenopathy.  Skin:    General: Skin is warm and dry.  Neurological:     Mental Status: He is alert and oriented to person, place, and time. Mental status is at baseline.     Cranial Nerves: No cranial nerve deficit.     Gait: Gait normal.     Comments: Chronic strabismus. No new or otherwise focal findings. No temporal artery tenderness.  Psychiatric:        Mood and Affect: Mood normal.        Behavior: Behavior normal.        Thought Content: Thought content normal.        Judgment: Judgment normal.       No results found for any visits on 07/25/20.  Assessment & Plan     1. Gastroesophageal reflux disease Try Dexilant daily and famotidine in the evening. - dexlansoprazole (DEXILANT) 60 MG capsule; Take 1 capsule (60 mg total) by mouth daily.  Dispense: 30 capsule; Refill: 11  2. Cervicogenic headache I think part of this is from his neck and he might have some migraine component.  Continue every other day Nurtec for now, neurology pending  3. Chronic paroxysmal hemicrania, not intractable Possibly migrainous to a degree.  I certainly do not think this is a chronic sinus infection.  4. Anxiety and depression Major ongoing issue for this  patient. Consider mood stabilizer in the future.  5. Other constipation Consider Linzess  6. Left lower quadrant abdominal pain Work-up completed by GI and surgery  7. Diverticulitis of large intestine with abscess without bleeding Treat with Augmentin short-term to see if he has immediate relief.  Clinically I do not think he has diverticulitis.   No follow-ups on file.      I, Randy Durie, MD, have reviewed all documentation for this visit. The documentation on 07/29/20 for the exam, diagnosis, procedures, and orders are all accurate and  complete.    Jerred Zaremba Cranford Mon, MD  [facility] [phone] (phone) [phone] (fax)  [place]

## 2020-07-25 NOTE — Patient Instructions (Signed)
Take Nurtec every other day.

## 2020-07-29 ENCOUNTER — Ambulatory Visit
Admission: RE | Admit: 2020-07-29 | Discharge: 2020-07-29 | Disposition: A | Discharge status: Home / Self Care | Payer: Medicare HMO | Source: Ambulatory Visit | Attending: Unknown Physician Specialty | Admitting: Unknown Physician Specialty

## 2020-07-29 ENCOUNTER — Other Ambulatory Visit: Payer: Self-pay

## 2020-07-29 ENCOUNTER — Other Ambulatory Visit: Payer: Self-pay | Admitting: Unknown Physician Specialty

## 2020-07-29 DIAGNOSIS — H9319 Tinnitus, unspecified ear: Secondary | ICD-10-CM | POA: Diagnosis not present

## 2020-07-29 DIAGNOSIS — H903 Sensorineural hearing loss, bilateral: Secondary | ICD-10-CM | POA: Diagnosis not present

## 2020-07-29 DIAGNOSIS — R52 Pain, unspecified: Secondary | ICD-10-CM | POA: Insufficient documentation

## 2020-07-29 DIAGNOSIS — R519 Headache, unspecified: Secondary | ICD-10-CM | POA: Diagnosis not present

## 2020-07-30 DIAGNOSIS — I776 Arteritis, unspecified: Secondary | ICD-10-CM | POA: Diagnosis not present

## 2020-08-08 ENCOUNTER — Telehealth: Payer: Self-pay

## 2020-08-08 DIAGNOSIS — B370 Candidal stomatitis: Secondary | ICD-10-CM

## 2020-08-08 DIAGNOSIS — B3781 Candidal esophagitis: Secondary | ICD-10-CM

## 2020-08-08 MED ORDER — FLUCONAZOLE 150 MG PO TABS: 150.0000 mg | ORAL_TABLET | Freq: Every day | ORAL | 0 refills | Status: DC

## 2020-08-08 NOTE — Telephone Encounter (Signed)
Medication sent into the pharmacy. Patient was advised.

## 2020-08-08 NOTE — Telephone Encounter (Signed)
Patient was put on antibiotics on 07/25/20 for his diverticulitis.  He now has thrush and is requesting treatment

## 2020-08-08 NOTE — Telephone Encounter (Signed)
[facility] faxed refill request for the following medications:  fluconazole (DIFLUCAN) 150 MG tablet [[id number]]     Please advise. Thanks, [employer]

## 2020-08-08 NOTE — Telephone Encounter (Signed)
Diflucan 150 mg daily for 2 days.

## 2020-08-09 ENCOUNTER — Other Ambulatory Visit: Payer: Self-pay | Admitting: *Deleted

## 2020-08-09 DIAGNOSIS — J0110 Acute frontal sinusitis, unspecified: Secondary | ICD-10-CM

## 2020-08-09 NOTE — Telephone Encounter (Signed)
Please advise refill? 

## 2020-08-15 MED ORDER — NYSTATIN 100000 UNIT/ML MT SUSP: 5.0000 mL | Freq: Four times a day (QID) | OROMUCOSAL | 0 refills | Status: DC

## 2020-08-28 ENCOUNTER — Ambulatory Visit: Payer: Self-pay | Admitting: Family Medicine

## 2020-09-09 ENCOUNTER — Telehealth (INDEPENDENT_AMBULATORY_CARE_PROVIDER_SITE_OTHER): Payer: Medicare HMO | Admitting: Family Medicine

## 2020-09-09 DIAGNOSIS — R059 Cough, unspecified: Secondary | ICD-10-CM

## 2020-09-09 DIAGNOSIS — J321 Chronic frontal sinusitis: Secondary | ICD-10-CM | POA: Diagnosis not present

## 2020-09-09 DIAGNOSIS — J040 Acute laryngitis: Secondary | ICD-10-CM

## 2020-09-09 DIAGNOSIS — J329 Chronic sinusitis, unspecified: Secondary | ICD-10-CM | POA: Diagnosis not present

## 2020-09-09 MED ORDER — AMOXICILLIN-POT CLAVULANATE 875-125 MG PO TABS: 1.0000 | ORAL_TABLET | Freq: Two times a day (BID) | ORAL | 0 refills | Status: DC

## 2020-09-09 NOTE — Progress Notes (Signed)
Virtual Visit via Telephone Note  I connected with [name] on 09/09/20 at  2:00 PM EDT by telephone and verified that I am speaking with the correct person using two identifiers.  Location: Patient: Home Provider: Office   I discussed the limitations, risks, security and privacy concerns of performing an evaluation and management service by telephone and the availability of in person appointments. I also discussed with the patient that there may be a patient responsible charge related to this service. The patient expressed understanding and agreed to proceed.   History of Present Illness: Patient calls in with complaints of less than 1 week ago developing a tickle in his throat followed by cough postnasal drainage fatigue headache laryngitis and myalgias.  He has had COVID vaccines plus booster. No one else is sick.  He has not lost his sense of taste or smell.   Observations/Objective: He is alert and appropriate during the phone call.  He does have some obvious laryngitis.  He is in no respiratory distress at all.  No audible wheezing.  Assessment and Plan: 1. Cough Obtain Covid test in vaccinated patient Robitussin twice a day, push fluids - Novel Coronavirus, NAA ([facility])  2. Recurrent sinus infections Augmentin twice a day for 10 days Stay hydrated. 3. Laryngitis I do not think you will need steroids.  4. Frontal sinusitis, unspecified chronicity May need ENT referral which has been offered to him several times. - Novel Coronavirus, NAA ([facility])   Follow Up Instructions:    I discussed the assessment and treatment plan with the patient. The patient was provided an opportunity to ask questions and all were answered. The patient agreed with the plan and demonstrated an understanding of the instructions.   The patient was advised to call back or seek an in-person evaluation if the symptoms worsen or if the condition fails to improve as anticipated.  I provided  10  minutes of non-face-to-face time during this encounter.   [name], MD

## 2020-09-10 LAB — SPECIMEN STATUS REPORT

## 2020-09-10 LAB — SARS-COV-2, NAA 2 DAY TAT

## 2020-09-10 LAB — NOVEL CORONAVIRUS, NAA: SARS-CoV-2, NAA: NOT DETECTED

## 2020-09-11 ENCOUNTER — Telehealth: Payer: Medicare HMO | Admitting: Family Medicine

## 2020-09-16 ENCOUNTER — Emergency Department: Payer: Medicare HMO

## 2020-09-16 ENCOUNTER — Ambulatory Visit: Payer: Self-pay | Admitting: *Deleted

## 2020-09-16 ENCOUNTER — Encounter: Payer: Self-pay | Admitting: Emergency Medicine

## 2020-09-16 ENCOUNTER — Other Ambulatory Visit: Payer: Self-pay

## 2020-09-16 ENCOUNTER — Emergency Department
Admission: EM | Admit: 2020-09-16 | Discharge: 2020-09-16 | Disposition: A | Discharge status: Home / Self Care | Payer: Medicare HMO | Attending: Emergency Medicine | Admitting: Emergency Medicine

## 2020-09-16 DIAGNOSIS — Z7982 Long term (current) use of aspirin: Secondary | ICD-10-CM | POA: Insufficient documentation

## 2020-09-16 DIAGNOSIS — J209 Acute bronchitis, unspecified: Secondary | ICD-10-CM | POA: Diagnosis not present

## 2020-09-16 DIAGNOSIS — Z87891 Personal history of nicotine dependence: Secondary | ICD-10-CM | POA: Insufficient documentation

## 2020-09-16 DIAGNOSIS — R0789 Other chest pain: Secondary | ICD-10-CM | POA: Diagnosis not present

## 2020-09-16 DIAGNOSIS — Z20822 Contact with and (suspected) exposure to covid-19: Secondary | ICD-10-CM | POA: Diagnosis not present

## 2020-09-16 DIAGNOSIS — R079 Chest pain, unspecified: Secondary | ICD-10-CM | POA: Diagnosis not present

## 2020-09-16 DIAGNOSIS — R0682 Tachypnea, not elsewhere classified: Secondary | ICD-10-CM | POA: Diagnosis not present

## 2020-09-16 DIAGNOSIS — J9811 Atelectasis: Secondary | ICD-10-CM | POA: Diagnosis not present

## 2020-09-16 DIAGNOSIS — R059 Cough, unspecified: Secondary | ICD-10-CM | POA: Diagnosis not present

## 2020-09-16 DIAGNOSIS — I2699 Other pulmonary embolism without acute cor pulmonale: Secondary | ICD-10-CM | POA: Diagnosis not present

## 2020-09-16 DIAGNOSIS — R0602 Shortness of breath: Secondary | ICD-10-CM | POA: Diagnosis not present

## 2020-09-16 LAB — BASIC METABOLIC PANEL
Anion gap: 8 (ref 5–15)
BUN: 15 mg/dL (ref 6–20)
CO2: 23 mmol/L (ref 22–32)
Calcium: 9 mg/dL (ref 8.9–10.3)
Chloride: 105 mmol/L (ref 98–111)
Creatinine, Ser: 1.94 mg/dL — ABNORMAL HIGH (ref 0.61–1.24)
GFR, Estimated: 40 mL/min — ABNORMAL LOW (ref >60)
Glucose, Bld: 110 mg/dL — ABNORMAL HIGH (ref 70–99)
Potassium: 3.7 mmol/L (ref 3.5–5.1)
Sodium: 136 mmol/L (ref 135–145)

## 2020-09-16 LAB — CBC
HCT: 43.7 % (ref 39.0–52.0)
Hemoglobin: 15.8 g/dL (ref 13.0–17.0)
MCH: 31.7 pg (ref 26.0–34.0)
MCHC: 36.2 g/dL — ABNORMAL HIGH (ref 30.0–36.0)
MCV: 87.8 fL (ref 80.0–100.0)
Platelets: 183 10*3/uL (ref 150–400)
RBC: 4.98 MIL/uL (ref 4.22–5.81)
RDW: 12.1 % (ref 11.5–15.5)
WBC: 6.2 10*3/uL (ref 4.0–10.5)
nRBC: 0 % (ref 0.0–0.2)

## 2020-09-16 LAB — RESP PANEL BY RT-PCR (FLU A&B, COVID) ARPGX2
Influenza A by PCR: NEGATIVE
Influenza B by PCR: NEGATIVE
SARS Coronavirus 2 by RT PCR: NEGATIVE

## 2020-09-16 LAB — BRAIN NATRIURETIC PEPTIDE: B Natriuretic Peptide: 12.6 pg/mL (ref 0.0–100.0)

## 2020-09-16 LAB — TROPONIN I (HIGH SENSITIVITY): Troponin I (High Sensitivity): 5 ng/L (ref <18)

## 2020-09-16 MED ORDER — IOHEXOL 350 MG/ML SOLN
60.0000 mL | Freq: Once | INTRAVENOUS | Status: AC | PRN
  Administered 2020-09-16: 60 mL via INTRAVENOUS

## 2020-09-16 MED ORDER — MIDAZOLAM HCL 2 MG/2ML IJ SOLN
1.0000 mg | Freq: Once | INTRAMUSCULAR | Status: AC
  Administered 2020-09-16: 1 mg via INTRAVENOUS
  Filled 2020-09-16: qty 2

## 2020-09-16 MED ORDER — IPRATROPIUM-ALBUTEROL 0.5-2.5 (3) MG/3ML IN SOLN
3.0000 mL | Freq: Once | RESPIRATORY_TRACT | Status: AC
  Administered 2020-09-16: 3 mL via RESPIRATORY_TRACT
  Filled 2020-09-16: qty 3

## 2020-09-16 MED ORDER — ALBUTEROL SULFATE HFA 108 (90 BASE) MCG/ACT IN AERS: 2.0000 | INHALATION_SPRAY | Freq: Four times a day (QID) | RESPIRATORY_TRACT | 2 refills | Status: AC | PRN

## 2020-09-16 MED ORDER — METHYLPREDNISOLONE SODIUM SUCC 125 MG IJ SOLR
125.0000 mg | Freq: Once | INTRAMUSCULAR | Status: AC
  Administered 2020-09-16: 125 mg via INTRAVENOUS
  Filled 2020-09-16: qty 2

## 2020-09-16 MED ORDER — PREDNISONE 50 MG PO TABS: 50.0000 mg | ORAL_TABLET | Freq: Every day | ORAL | 0 refills | Status: DC

## 2020-09-16 NOTE — ED Provider Notes (Signed)
[facility] Emergency Department Provider Note   ____________________________________________    I have reviewed the triage vital signs and the nursing notes.   HISTORY  Chief Complaint Shortness of Breath and Chest Pain     HPI Randy Dean is a 55 y.o. male with a history of anxiety, PTSD, prediabetes, who presents with complaints of shortness of breath and chest discomfort.  Patient reports he has had cough, upper respiratory congestion which started over 1-1/2 weeks ago, will start on antibiotics with some improvement.  More recently in the last 2 to 3 days he has felt mild shortness of breath with chest tightness.  Denies fevers or chills.  No significant pleurisy.  No calf pain or swelling.  No history of DVT reported.  Past Medical History:  Diagnosis Date  . Abnormality of gait 10/30/2015  . Allergic rhinitis   . Anxiety   . Chronic headaches    partially sinus/partially brain injury (per pt)  . GERD (gastroesophageal reflux disease)   . Head trauma 2010   has caused anxiety, memory issues and neck problems  . Headache 10/30/2015   Right temporal   . Neck stiffness    metal plate.  mild limitations of side to side and up and dowm movement  . Prediabetes   . PTSD (post-traumatic stress disorder)   . Stroke [facility])    TIA x3- some residual right side weakness  . Vertigo   . Weakness of right side of body    from TIAs    Patient Active Problem List   Diagnosis Date Noted  . History of TIA (transient ischemic attack) 08/11/2019  . Diverticulitis large intestine 04/11/2018  . Prediabetes 10/21/2016  . Abnormality of gait 10/30/2015  . Headache 10/30/2015  . Anxiety and depression 11/23/2014  . Brain injuries ([place]) 11/23/2014  . Clinical depression 11/23/2014  . HLD (hyperlipidemia) 11/23/2014  . Insomnia due to medical condition 11/23/2014  . Injury of face and neck 11/23/2014  . Cervical pain 11/23/2014  . Loss of feeling or  sensation 11/23/2014  . Tingling 11/23/2014  . Adiposity 11/23/2014  . Allergic rhinitis, seasonal 11/23/2014  . Neurosis, posttraumatic 11/23/2014  . Spinal stenosis in cervical region 11/23/2014  . Injury brain, traumatic ([place]) 11/23/2014  . Has a tremor 11/23/2014  . Disease of vein 11/23/2014  . Head revolving around 11/23/2014  . Shortness of breath 03/14/2013  . Anxiety 10/11/2008  . Intervertebral cervical disc disorder with myelopathy, cervical region 10/11/2008  . Musculoskeletal disorder and symptoms referable to neck 10/11/2008  . Acid reflux 10/11/2008    Past Surgical History:  Procedure Laterality Date  . BACK SURGERY    . COLONOSCOPY WITH PROPOFOL N/A 03/02/2019   Procedure: COLONOSCOPY WITH PROPOFOL;  Surgeon: Benjamine Sprague, DO;  Location: [facility] ENDOSCOPY;  Service: General;  Laterality: N/A;  . ENDOSCOPIC TURBINATE REDUCTION Bilateral 08/22/2015   Procedure: ENDOSCOPIC TURBINATE REDUCTION;  Surgeon: Margaretha Sheffield, MD;  Location: [place];  Service: ENT;  Laterality: Bilateral;  . EYE MUSCLE SURGERY Left    Due to conential left amblyopia  . NASAL SINUS SURGERY  september 2016  . NECK SURGERY      Prior to Admission medications   Medication Sig Start Date End Date Taking? Authorizing Provider  albuterol (VENTOLIN HFA) 108 (90 Base) MCG/ACT inhaler Inhale 2 puffs into the lungs every 6 (six) hours as needed for wheezing or shortness of breath. 09/16/20  Yes Lavonia Drafts, MD  predniSONE (DELTASONE) 50  MG tablet Take 1 tablet (50 mg total) by mouth daily with breakfast. 09/16/20  Yes Lavonia Drafts, MD  acetaminophen (TYLENOL) 500 MG tablet Take 500 mg by mouth every 6 (six) hours as needed for mild pain.     [provider]  amoxicillin-clavulanate (AUGMENTIN) 875-125 MG tablet Take 1 tablet by mouth 2 (two) times daily. 09/09/20   Jerrol Banana., MD  aspirin EC 81 MG tablet Take 81 mg by mouth daily.    [provider]  busPIRone  (BUSPAR) 15 MG tablet Take 1 tablet (15 mg total) by mouth 3 (three) times daily. 07/25/20   Jerrol Banana., MD  cyclobenzaprine (FLEXERIL) 10 MG tablet Take 1 tablet, one hour before bedtime Patient taking differently: as needed. Take 1 tablet, one hour before bedtime 05/01/19   Jerrol Banana., MD  dexlansoprazole [facility]) 60 MG capsule Take 1 capsule (60 mg total) by mouth daily. 07/25/20   Jerrol Banana., MD  diazepam (VALIUM) 5 MG tablet Take 1 tablet (5 mg total) by mouth See admin instructions. 1 PO 1 hour prior to procedure, may repeat if needed 08/02/17   Jerrol Banana., MD  famotidine (PEPCID) 40 MG tablet Take 1 tablet (40 mg total) by mouth daily. 07/25/20   Jerrol Banana., MD  fluconazole (DIFLUCAN) 150 MG tablet Take 1 tablet (150 mg total) by mouth daily. 08/08/20   Jerrol Banana., MD  fluticasone [facility]) 50 MCG/ACT nasal spray Place 2 sprays into both nostrils 2 (two) times daily. 07/25/20   Jerrol Banana., MD  lansoprazole (PREVACID) 30 MG capsule Take 30 mg by mouth daily at 12 noon.    [provider]  loratadine (CLARITIN) 10 MG tablet Take 10 mg by mouth daily.    [provider]  nystatin (MYCOSTATIN) 100000 UNIT/ML suspension Take 5 mLs (500,000 Units total) by mouth 4 (four) times daily. 08/15/20   Jerrol Banana., MD  Rimegepant Sulfate (NURTEC) 75 MG TBDP Take 1 tablet by mouth every other day. 07/25/20   Jerrol Banana., MD  sertraline (ZOLOFT) 100 MG tablet Take 1 tablet (100 mg total) by mouth daily. Patient taking differently: Take 100 mg by mouth as needed. 10/18/18   Jerrol Banana., MD     Allergies Serzone [nefazodone], Codeine, Doxycycline, Effexor [venlafaxine], Paxil [paroxetine hcl], T-pa [alteplase], and Esomeprazole sodium  Family History  Problem Relation Age of Onset  . Fibromyalgia Mother   . Heart disease Father   . Tremor Father   . Diabetes Maternal Grandmother    . Hypertension Maternal Grandmother   . Hypertension Maternal Grandfather   . Diabetes Paternal Grandmother   . Hypertension Paternal Grandmother   . Hypertension Paternal Grandfather     Social History Social History   Tobacco Use  . Smoking status: Former Smoker    Packs/day: 0.50    Years: 3.00    Pack years: 1.50    Quit date: 06/28/1984    Years since quitting: 36.2  . Smokeless tobacco: Current User    Types: Snuff    Last attempt to quit: 06/29/1986  . Tobacco comment: 1 can dip/day  Vaping Use  . Vaping Use: Never used  Substance Use Topics  . Alcohol use: No    Alcohol/week: 0.0 standard drinks  . Drug use: No    Review of Systems  Constitutional: No fever/chills Eyes: No visual changes.  ENT: No sore throat.  Cardiovascular: As above Respiratory: As above Gastrointestinal: No abdominal pain.  No nausea, no vomiting.   Genitourinary: Negative for dysuria. Musculoskeletal: Negative for back pain. Skin: Negative for rash. Neurological: Negative for headaches or weakness   ____________________________________________   PHYSICAL EXAM:  VITAL SIGNS: ED Triage Vitals [09/16/20 1129]  Enc Vitals Group     BP (!) 144/106     Pulse Rate 82     Resp (!) 32     Temp 98.3 F (36.8 C)     Temp Source Oral     SpO2 100 %     Weight 98 kg (216 lb)     Height 1.829 m (6')     Head Circumference      Peak Flow      Pain Score 4     Pain Loc      Pain Edu?      Excl. in [place]?     Constitutional: Alert and oriented  Nose: No congestion/rhinnorhea. Mouth/Throat: Mucous membranes are moist.   Neck:  Painless ROM Cardiovascular: Normal rate, regular rhythm. Grossly normal heart sounds.  Good peripheral circulation. Respiratory: Tachypnea.  No retractions.   Scattered wheezing Gastrointestinal: Soft and nontender. No distention.  No CVA tenderness.  Musculoskeletal: No lower extremity tenderness nor edema.  Warm and well perfused Neurologic:  Normal speech  and language. No gross focal neurologic deficits are appreciated.  Skin:  Skin is warm, dry and intact. No rash noted. Psychiatric: Patient is somewhat anxious  ____________________________________________   LABS (all labs ordered are listed, but only abnormal results are displayed)  Labs Reviewed  BASIC METABOLIC PANEL - Abnormal; Notable for the following components:      Result Value   Glucose, Bld 110 (*)    Creatinine, Ser 1.94 (*)    GFR, Estimated 40 (*)    All other components within normal limits  CBC - Abnormal; Notable for the following components:   MCHC 36.2 (*)    All other components within normal limits  RESP PANEL BY RT-PCR (FLU A&B, COVID) ARPGX2  BRAIN NATRIURETIC PEPTIDE  TROPONIN I (HIGH SENSITIVITY)   ____________________________________________  EKG  ED ECG REPORT I, Lavonia Drafts, the attending physician, personally viewed and interpreted this ECG.  Date: 09/16/2020  Rhythm: normal sinus rhythm QRS Axis: normal Intervals: normal ST/T Wave abnormalities: Nonspecific changes Narrative Interpretation: no evidence of acute ischemia  ____________________________________________  RADIOLOGY  Chest x-ray viewed by me, no infiltrate or pneumothorax CT angiography reviewed by me, no PE, reassuring exam ____________________________________________   PROCEDURES  Procedure(s) performed: No  Procedures   Critical Care performed: No ____________________________________________   INITIAL IMPRESSION / ASSESSMENT AND PLAN / ED COURSE  Pertinent labs & imaging results that were available during my care of the patient were reviewed by me and considered in my medical decision making (see chart for details).  Patient presents with chest tightness and shortness of breath in the setting of upper respiratory infection.  Differential includes URI with bronchospasm, especially given scattered wheezes on exam, pneumonia, less likely PE or ACS.  CHF is also  a possibility  Lab work is overall reassuring, normal white blood cell count, normal troponin, normal BNP.  Mildly elevated creatinine  Chest x-ray with possible faint edema, sent for CT angiography to rule out PE which was quite reassuring.  Patient given 1 mg of IV Valium because of fear of CT and MRI machines  Covid and flu test negative ----------------------------------------- 3:00 PM on 09/16/2020 -----------------------------------------  Patient feeling much better upon hearing that imaging and lab tests are overall normal.  Still scattered wheezes on exam, will give an additional DuoNeb and Solu-Medrol and reevaluate, anticipate discharge with prednisone, albuterol inhaler   ----------------------------------------- 3:19 PM on 09/16/2020 ----------------------------------------- Patient reports he is breathing more easily, given overall quite reassuring work-up appropriate for discharge home with strict return precautions.  Will discharge with prednisone, albuterol.  Patient agrees with this plan.     ____________________________________________   FINAL CLINICAL IMPRESSION(S) / ED DIAGNOSES  Final diagnoses:  Bronchospasm with bronchitis, acute        Note:  This document was prepared using Dragon voice recognition software and may include unintentional dictation errors.   Lavonia Drafts, MD 09/16/20 1520

## 2020-09-16 NOTE — Telephone Encounter (Signed)
Patient is calling to report he is not getting better since last video visit with PCP- patient is SOB and having to take breathes between his answers to questions- O2 sat 97/COVID test - negative- but not breathing well. Advised ED for evaluation of symptoms.  Reason for Disposition . [1] MODERATE difficulty breathing (e.g., speaks in phrases, SOB even at rest, pulse 100-120) AND [2] NEW-onset or WORSE than normal  Answer Assessment - Initial Assessment Questions 1. RESPIRATORY STATUS: "Describe your breathing?" (e.g., wheezing, shortness of breath, unable to speak, severe coughing)      Negative COVID testing at home- patient has been on antibiotics and albuterol- not getting better. 2. ONSET: "When did this breathing problem begin?"      3 weeks 3. PATTERN "Does the difficult breathing come and go, or has it been constant since it started?"      Comes and goes- albuterol helped 4. SEVERITY: "How bad is your breathing?" (e.g., mild, moderate, severe)    - MILD: No SOB at rest, mild SOB with walking, speaks normally in sentences, can lay down, no retractions, pulse < 100.    - MODERATE: SOB at rest, SOB with minimal exertion and prefers to sit, cannot lie down flat, speaks in phrases, mild retractions, audible wheezing, pulse 100-120.    - SEVERE: Very SOB at rest, speaks in single words, struggling to breathe, sitting hunched forward, retractions, pulse > 120      Moderate/severe- O2 sat 97 5. RECURRENT SYMPTOM: "Have you had difficulty breathing before?" If Yes, ask: "When was the last time?" and "What happened that time?"      Long time- bronchitis history 6. CARDIAC HISTORY: "Do you have any history of heart disease?" (e.g., heart attack, angina, bypass surgery, angioplasty)      Stroke history 7. LUNG HISTORY: "Do you have any history of lung disease?"  (e.g., pulmonary embolus, asthma, emphysema)     no 8. CAUSE: "What do you think is causing the breathing problem?"      unsure 9.  OTHER SYMPTOMS: "Do you have any other symptoms? (e.g., dizziness, runny nose, cough, chest pain, fever)     Runny nose, headache, chest discomfort, cough,  10. PREGNANCY: "Is there any chance you are pregnant?" "When was your last menstrual period?"       n/a 11. TRAVEL: "Have you traveled out of the country in the last month?" (e.g., travel history, exposures)       no  Protocols used: BREATHING DIFFICULTY-A-AH

## 2020-09-16 NOTE — ED Notes (Signed)
Pt was provided with discharge instructions and follow up care. Pt denies any questions or concerns at this time.   VSS.   Pt left department with all belongings.

## 2020-09-16 NOTE — ED Notes (Signed)
Repeat lactic and trop not needed per MD

## 2020-09-16 NOTE — ED Triage Notes (Signed)
Patient to ER for c/o shortness of breath and chest pain across chest. Also reports pain between shoulder blades and back, as well as down left arm intermittently. Patient states approx 2 weeks ago, he had shortness of breath with coughing up green sputum. Was seen by virtual visit and given Augmentin (still taking, 2 days left). Patient states last night he developed increased shortness of breath, but sputum has changed to clear/light green. Denies any fevers. Covid tests have been negative.

## 2020-09-20 DIAGNOSIS — Z0000 Encounter for general adult medical examination without abnormal findings: Secondary | ICD-10-CM | POA: Diagnosis not present

## 2020-09-20 DIAGNOSIS — E538 Deficiency of other specified B group vitamins: Secondary | ICD-10-CM | POA: Diagnosis not present

## 2020-09-20 DIAGNOSIS — R739 Hyperglycemia, unspecified: Secondary | ICD-10-CM | POA: Diagnosis not present

## 2020-09-20 DIAGNOSIS — E782 Mixed hyperlipidemia: Secondary | ICD-10-CM | POA: Diagnosis not present

## 2020-09-20 DIAGNOSIS — J4540 Moderate persistent asthma, uncomplicated: Secondary | ICD-10-CM | POA: Diagnosis not present

## 2020-09-20 DIAGNOSIS — Z125 Encounter for screening for malignant neoplasm of prostate: Secondary | ICD-10-CM | POA: Diagnosis not present

## 2020-10-04 DIAGNOSIS — J019 Acute sinusitis, unspecified: Secondary | ICD-10-CM | POA: Diagnosis not present

## 2020-10-10 DIAGNOSIS — N184 Chronic kidney disease, stage 4 (severe): Secondary | ICD-10-CM | POA: Diagnosis not present

## 2020-10-10 DIAGNOSIS — M542 Cervicalgia: Secondary | ICD-10-CM | POA: Diagnosis not present

## 2020-10-10 DIAGNOSIS — R739 Hyperglycemia, unspecified: Secondary | ICD-10-CM | POA: Diagnosis not present

## 2020-10-10 DIAGNOSIS — R0609 Other forms of dyspnea: Secondary | ICD-10-CM | POA: Diagnosis not present

## 2020-10-10 DIAGNOSIS — J45909 Unspecified asthma, uncomplicated: Secondary | ICD-10-CM | POA: Diagnosis not present

## 2020-10-21 DIAGNOSIS — R0600 Dyspnea, unspecified: Secondary | ICD-10-CM | POA: Diagnosis not present

## 2020-10-21 DIAGNOSIS — I208 Other forms of angina pectoris: Secondary | ICD-10-CM | POA: Diagnosis not present

## 2020-10-29 ENCOUNTER — Other Ambulatory Visit: Payer: Self-pay | Admitting: Specialist

## 2020-10-29 DIAGNOSIS — R0600 Dyspnea, unspecified: Secondary | ICD-10-CM | POA: Diagnosis not present

## 2020-10-29 DIAGNOSIS — R0609 Other forms of dyspnea: Secondary | ICD-10-CM

## 2020-12-04 DIAGNOSIS — M545 Low back pain, unspecified: Secondary | ICD-10-CM | POA: Diagnosis not present

## 2020-12-04 DIAGNOSIS — M25552 Pain in left hip: Secondary | ICD-10-CM | POA: Diagnosis not present

## 2020-12-04 DIAGNOSIS — R1032 Left lower quadrant pain: Secondary | ICD-10-CM | POA: Diagnosis not present

## 2020-12-04 DIAGNOSIS — M79605 Pain in left leg: Secondary | ICD-10-CM | POA: Diagnosis not present

## 2020-12-06 ENCOUNTER — Other Ambulatory Visit: Payer: Self-pay

## 2020-12-06 ENCOUNTER — Other Ambulatory Visit: Payer: Self-pay | Admitting: Internal Medicine

## 2020-12-06 ENCOUNTER — Ambulatory Visit
Admission: RE | Admit: 2020-12-06 | Discharge: 2020-12-06 | Disposition: A | Discharge status: Home / Self Care | Payer: Medicare HMO | Source: Ambulatory Visit | Attending: Internal Medicine | Admitting: Internal Medicine

## 2020-12-06 DIAGNOSIS — R1032 Left lower quadrant pain: Secondary | ICD-10-CM

## 2020-12-06 DIAGNOSIS — N281 Cyst of kidney, acquired: Secondary | ICD-10-CM | POA: Diagnosis not present

## 2020-12-06 DIAGNOSIS — K5750 Diverticulosis of both small and large intestine without perforation or abscess without bleeding: Secondary | ICD-10-CM | POA: Diagnosis not present

## 2020-12-06 DIAGNOSIS — M5116 Intervertebral disc disorders with radiculopathy, lumbar region: Secondary | ICD-10-CM | POA: Diagnosis not present

## 2020-12-18 DIAGNOSIS — H524 Presbyopia: Secondary | ICD-10-CM | POA: Diagnosis not present

## 2021-01-25 DIAGNOSIS — E785 Hyperlipidemia, unspecified: Secondary | ICD-10-CM | POA: Diagnosis not present

## 2021-01-25 DIAGNOSIS — I129 Hypertensive chronic kidney disease with stage 1 through stage 4 chronic kidney disease, or unspecified chronic kidney disease: Secondary | ICD-10-CM | POA: Diagnosis not present

## 2021-01-25 DIAGNOSIS — E669 Obesity, unspecified: Secondary | ICD-10-CM | POA: Diagnosis not present

## 2021-01-25 DIAGNOSIS — R69 Illness, unspecified: Secondary | ICD-10-CM | POA: Diagnosis not present

## 2021-01-25 DIAGNOSIS — M549 Dorsalgia, unspecified: Secondary | ICD-10-CM | POA: Diagnosis not present

## 2021-01-25 DIAGNOSIS — G8929 Other chronic pain: Secondary | ICD-10-CM | POA: Diagnosis not present

## 2021-01-25 DIAGNOSIS — H548 Legal blindness, as defined in USA: Secondary | ICD-10-CM | POA: Diagnosis not present

## 2021-01-25 DIAGNOSIS — J329 Chronic sinusitis, unspecified: Secondary | ICD-10-CM | POA: Diagnosis not present

## 2021-01-25 DIAGNOSIS — J45909 Unspecified asthma, uncomplicated: Secondary | ICD-10-CM | POA: Diagnosis not present

## 2021-01-25 DIAGNOSIS — K219 Gastro-esophageal reflux disease without esophagitis: Secondary | ICD-10-CM | POA: Diagnosis not present

## 2021-01-25 DIAGNOSIS — G629 Polyneuropathy, unspecified: Secondary | ICD-10-CM | POA: Diagnosis not present

## 2021-02-12 ENCOUNTER — Other Ambulatory Visit: Payer: Self-pay | Admitting: Family Medicine

## 2021-02-12 DIAGNOSIS — M5416 Radiculopathy, lumbar region: Secondary | ICD-10-CM

## 2021-02-12 DIAGNOSIS — J019 Acute sinusitis, unspecified: Secondary | ICD-10-CM | POA: Diagnosis not present

## 2021-02-12 DIAGNOSIS — R1032 Left lower quadrant pain: Secondary | ICD-10-CM | POA: Diagnosis not present

## 2021-03-17 DIAGNOSIS — M542 Cervicalgia: Secondary | ICD-10-CM | POA: Diagnosis not present

## 2021-03-17 DIAGNOSIS — N400 Enlarged prostate without lower urinary tract symptoms: Secondary | ICD-10-CM | POA: Diagnosis not present

## 2021-04-04 DIAGNOSIS — R739 Hyperglycemia, unspecified: Secondary | ICD-10-CM | POA: Diagnosis not present

## 2021-04-04 DIAGNOSIS — N184 Chronic kidney disease, stage 4 (severe): Secondary | ICD-10-CM | POA: Diagnosis not present

## 2021-04-10 DIAGNOSIS — Z23 Encounter for immunization: Secondary | ICD-10-CM | POA: Diagnosis not present

## 2021-04-10 DIAGNOSIS — Z125 Encounter for screening for malignant neoplasm of prostate: Secondary | ICD-10-CM | POA: Diagnosis not present

## 2021-04-10 DIAGNOSIS — Z0000 Encounter for general adult medical examination without abnormal findings: Secondary | ICD-10-CM | POA: Diagnosis not present

## 2021-04-10 DIAGNOSIS — N183 Chronic kidney disease, stage 3 unspecified: Secondary | ICD-10-CM | POA: Diagnosis not present

## 2021-04-10 DIAGNOSIS — J45909 Unspecified asthma, uncomplicated: Secondary | ICD-10-CM | POA: Diagnosis not present

## 2021-04-10 DIAGNOSIS — E1122 Type 2 diabetes mellitus with diabetic chronic kidney disease: Secondary | ICD-10-CM | POA: Diagnosis not present

## 2021-04-14 ENCOUNTER — Other Ambulatory Visit: Payer: Self-pay | Admitting: Surgery

## 2021-04-14 ENCOUNTER — Ambulatory Visit
Admission: RE | Admit: 2021-04-14 | Discharge: 2021-04-14 | Disposition: A | Discharge status: Home / Self Care | Payer: Medicare HMO | Source: Ambulatory Visit | Attending: Surgery | Admitting: Surgery

## 2021-04-14 ENCOUNTER — Other Ambulatory Visit: Payer: Self-pay

## 2021-04-14 DIAGNOSIS — K5732 Diverticulitis of large intestine without perforation or abscess without bleeding: Secondary | ICD-10-CM

## 2021-04-14 DIAGNOSIS — R1032 Left lower quadrant pain: Secondary | ICD-10-CM | POA: Diagnosis not present

## 2021-04-14 DIAGNOSIS — N281 Cyst of kidney, acquired: Secondary | ICD-10-CM | POA: Diagnosis not present

## 2021-05-06 ENCOUNTER — Ambulatory Visit: Payer: Self-pay | Admitting: Surgery

## 2021-05-06 DIAGNOSIS — K5732 Diverticulitis of large intestine without perforation or abscess without bleeding: Secondary | ICD-10-CM | POA: Diagnosis not present

## 2021-05-06 MED ORDER — INDOCYANINE GREEN 25 MG IV SOLR
5.0000 mg | Freq: Once | INTRAVENOUS | Status: AC
  Filled 2021-05-06: qty 10

## 2021-05-06 NOTE — H&P (Signed)
Subjective:   CC: Diverticulitis of large intestine without bleeding, unspecified complication status [E52.77]   HPI:  Randy Dean is a 55 y.o. male who returns for evaluation of above. Trial of abx did not work again, states pain is still persistent.   Past Medical History:  has a past medical history of Asthma, unspecified asthma severity, unspecified whether complicated, unspecified whether persistent (09/18/20), Blunt head trauma, GERD (gastroesophageal reflux disease), HLD (hyperlipidemia) (11/23/2014), Right temporal headache, Sinusitis, unspecified, Stage 3b chronic kidney disease (CMS-HCC) (09/20/2020), and Type 2 diabetes mellitus without complication (CMS-HCC) (82/42/3536).   Past Surgical History:  has a past surgical history that includes neck surgery; back surgery; eye muscle surgery; nasal sinus surgery; endoscopic turbinate reduction; Colonoscopy (03/02/2019); Spine surgery; Blepharoplasty; and egd (12/08/2019).   Family History: family history includes COPD in his mother; Coronary Artery Disease (Blocked arteries around heart) in his maternal grandfather; Dementia in his mother; Heart disease in his father and paternal grandmother; High blood pressure (Hypertension) in his mother; Myocardial Infarction (Heart attack) in his father and mother; No Known Problems in his brother, brother, and brother.   Social History:  reports that he quit smoking about 37 years ago. His smoking use included cigarettes. He started smoking about 41 years ago. He has a 4.00 pack-year smoking history. He uses smokeless tobacco. He reports that he does not drink alcohol and does not use drugs.   Current Medications: has a current medication list which includes the following prescription(s): acetaminophen, albuterol, aspirin, dexilant, famotidine, fexofenadine, fluticasone propionate, fluticasone propionate, hydrochlorothiazide, lansoprazole, pseudoephedrine, tramadol, blood glucose diagnostic, blood  glucose meter, buspirone, lancets, montelukast, and tamsulosin.   Allergies:  Allergies Allergen Reactions  Alteplase Angioedema     Fascial flushing and tongue swelling. At [facility]  Codeine Nausea     Dizziness, sweating  Doxycycline Other (See Comments)     Severe headaches Severe headaches    Effexor [Venlafaxine] Other (See Comments)     "Feels out of it"  Nefazodone Swelling     Tongue and facial swelling  Nexium Iv [Esomeprazole Sodium] Rash  Paxil [Paroxetine Hcl] Other (See Comments)     "does not feel right in the head"     ROS:  A 15 point review of systems was performed and pertinent positives and negatives noted in HPI   Objective:   BP (!) 149/84   Pulse 80   Ht 182.9 cm (6')   Wt 96.6 kg (213 lb)   BMI 28.89 kg/m    Constitutional :  No distress, cooperative, alert Lymphatics/Throat:  Supple with no lymphadenopathy Respiratory:  Clear to auscultation bilaterally Cardiovascular:  Regular rate and rhythm Gastrointestinal: Soft, TTP persistent LLQ, non-distended, no organomegaly. Musculoskeletal: Steady gait and movement Skin: Cool and moist, no surgical scars Psychiatric: Normal affect, non-agitated, not confused       LABS:  n/a   RADS: n/a   Assessment:      Diverticulitis of large intestine without bleeding, unspecified complication status [R44.31], recurrent and persistent, pt is ready to proceed with excision for more definitive relief. Clinically ok to continue to monitor as outpt at this point.   Plan:   The risk of laparoscopic colon resection surgery includes, but not limited to, recurrence, bleeding, chronic pain, post-op infxn, post-op SBO or ileus, hernias, resection of bowel, re-anastamosis, possible ostomy placement and need for re-operation to address said risks. The risks of general anesthetic, if used, includes MI, CVA, sudden death or even reaction to anesthetic medications also discussed.  Alternatives include continued observation.   Benefits include possible symptom relief, preventing further decline in health and possible death.   Typical post-op recovery time of additional days in hospital for observation afterwards also discussed.   Prep ordered.  Will proceed with ERAS protocol as well.  Pending medical clearance and workup as noted above.     The patient verbalized understanding and all questions were answered to the patient's satisfaction.

## 2021-05-06 NOTE — H&P (View-Only) (Signed)
Subjective:   CC: Diverticulitis of large intestine without bleeding, unspecified complication status [B15.17]   HPI:  Randy Dean is a 55 y.o. male who returns for evaluation of above. Trial of abx did not work again, states pain is still persistent.   Past Medical History:  has a past medical history of Asthma, unspecified asthma severity, unspecified whether complicated, unspecified whether persistent (09/18/20), Blunt head trauma, GERD (gastroesophageal reflux disease), HLD (hyperlipidemia) (11/23/2014), Right temporal headache, Sinusitis, unspecified, Stage 3b chronic kidney disease (CMS-HCC) (09/20/2020), and Type 2 diabetes mellitus without complication (CMS-HCC) (61/60/7371).   Past Surgical History:  has a past surgical history that includes neck surgery; back surgery; eye muscle surgery; nasal sinus surgery; endoscopic turbinate reduction; Colonoscopy (03/02/2019); Spine surgery; Blepharoplasty; and egd (12/08/2019).   Family History: family history includes COPD in his mother; Coronary Artery Disease (Blocked arteries around heart) in his maternal grandfather; Dementia in his mother; Heart disease in his father and paternal grandmother; High blood pressure (Hypertension) in his mother; Myocardial Infarction (Heart attack) in his father and mother; No Known Problems in his brother, brother, and brother.   Social History:  reports that he quit smoking about 37 years ago. His smoking use included cigarettes. He started smoking about 41 years ago. He has a 4.00 pack-year smoking history. He uses smokeless tobacco. He reports that he does not drink alcohol and does not use drugs.   Current Medications: has a current medication list which includes the following prescription(s): acetaminophen, albuterol, aspirin, dexilant, famotidine, fexofenadine, fluticasone propionate, fluticasone propionate, hydrochlorothiazide, lansoprazole, pseudoephedrine, tramadol, blood glucose diagnostic, blood  glucose meter, buspirone, lancets, montelukast, and tamsulosin.   Allergies:  Allergies Allergen Reactions  Alteplase Angioedema     Fascial flushing and tongue swelling. At [facility]  Codeine Nausea     Dizziness, sweating  Doxycycline Other (See Comments)     Severe headaches Severe headaches    Effexor [Venlafaxine] Other (See Comments)     "Feels out of it"  Nefazodone Swelling     Tongue and facial swelling  Nexium Iv [Esomeprazole Sodium] Rash  Paxil [Paroxetine Hcl] Other (See Comments)     "does not feel right in the head"     ROS:  A 15 point review of systems was performed and pertinent positives and negatives noted in HPI   Objective:   BP (!) 149/84   Pulse 80   Ht 182.9 cm (6')   Wt 96.6 kg (213 lb)   BMI 28.89 kg/m    Constitutional :  No distress, cooperative, alert Lymphatics/Throat:  Supple with no lymphadenopathy Respiratory:  Clear to auscultation bilaterally Cardiovascular:  Regular rate and rhythm Gastrointestinal: Soft, TTP persistent LLQ, non-distended, no organomegaly. Musculoskeletal: Steady gait and movement Skin: Cool and moist, no surgical scars Psychiatric: Normal affect, non-agitated, not confused       LABS:  n/a   RADS: n/a   Assessment:      Diverticulitis of large intestine without bleeding, unspecified complication status [G62.69], recurrent and persistent, pt is ready to proceed with excision for more definitive relief. Clinically ok to continue to monitor as outpt at this point.   Plan:   The risk of laparoscopic colon resection surgery includes, but not limited to, recurrence, bleeding, chronic pain, post-op infxn, post-op SBO or ileus, hernias, resection of bowel, re-anastamosis, possible ostomy placement and need for re-operation to address said risks. The risks of general anesthetic, if used, includes MI, CVA, sudden death or even reaction to anesthetic medications also discussed.  Alternatives include continued observation.   Benefits include possible symptom relief, preventing further decline in health and possible death.   Typical post-op recovery time of additional days in hospital for observation afterwards also discussed.   Prep ordered.  Will proceed with ERAS protocol as well.  Pending medical clearance and workup as noted above.     The patient verbalized understanding and all questions were answered to the patient's satisfaction.

## 2021-05-12 ENCOUNTER — Other Ambulatory Visit: Payer: Self-pay

## 2021-05-12 ENCOUNTER — Encounter
Admission: RE | Admit: 2021-05-12 | Discharge: 2021-05-12 | Disposition: A | Discharge status: Home / Self Care | Payer: Medicare HMO | Source: Ambulatory Visit | Attending: Surgery | Admitting: Surgery

## 2021-05-12 DIAGNOSIS — Z01818 Encounter for other preprocedural examination: Secondary | ICD-10-CM

## 2021-05-12 DIAGNOSIS — Z79899 Other long term (current) drug therapy: Secondary | ICD-10-CM

## 2021-05-12 HISTORY — DX: Dyspnea, unspecified: R06.00

## 2021-05-12 HISTORY — DX: Chronic kidney disease, stage 3b: N18.32

## 2021-05-12 HISTORY — DX: Unspecified injury of unspecified kidney, initial encounter: S37.009A

## 2021-05-12 HISTORY — DX: Claustrophobia: F40.240

## 2021-05-12 HISTORY — DX: Other complications of anesthesia, initial encounter: T88.59XA

## 2021-05-12 HISTORY — DX: Unspecified cord compression: G95.20

## 2021-05-12 HISTORY — DX: Disorder of kidney and ureter, unspecified: N28.9

## 2021-05-12 HISTORY — DX: Unspecified osteoarthritis, unspecified site: M19.90

## 2021-05-12 HISTORY — DX: Unspecified asthma, uncomplicated: J45.909

## 2021-05-12 HISTORY — DX: Benign prostatic hyperplasia without lower urinary tract symptoms: N40.0

## 2021-05-12 NOTE — Patient Instructions (Addendum)
Your procedure is scheduled on:05-26-21 Monday Report to the Registration Desk on the 1st floor of the [facility].Then proceed to the 2nd floor Surgery Desk in the [facility] To find out your arrival time, please call [phone] between 1PM - 3PM on:05-23-21 Friday  REMEMBER: Instructions that are not followed completely may result in serious medical risk, up to and including death; or upon the discretion of your surgeon and anesthesiologist your surgery may need to be rescheduled.  Do not eat food after midnight the night before surgery.  No gum chewing, lozengers or hard candies.  You may however, drink CLEAR liquids up to 2 hours before you are scheduled to arrive for your surgery. Do not drink anything within 2 hours of your scheduled arrival time.  Clear liquids include: - water  - apple juice without pulp - gatorade (not RED, PURPLE, OR BLUE) - black coffee or tea (Do NOT add milk or creamers to the coffee or tea) Do NOT drink anything that is not on this list.  TAKE THESE MEDICATIONS THE MORNING OF SURGERY WITH A SIP OF WATER: -dexlansoprazole (DEXILANT) 60 MG capsule -fexofenadine (ALLEGRA) 180 MG tablet  Follow Dr Ines Bloomer bowel prep instructions   Use your fluticasone (FLOVENT HFA) 110 MCG/ACT inhaler and albuterol (VENTOLIN HFA) 108 (90 Base) MCG/ACT inhaler at home the morning of surgery and bring your Albuterol Inhaler with you to the hospital  Follow Dr Ines Bloomer instructions that he gave you in the office on when to stop your aspirin EC 81 MG tablet  One week prior to surgery: Stop Anti-inflammatories (NSAIDS) such as Advil, Aleve, Ibuprofen, Motrin, Naproxen, Naprosyn and Aspirin based products such as Excedrin, Goodys Powder, BC Powder.You may however, continue to take Tylenol/Tramadol if needed for pain up until the day of surgery.  Stop ANY OVER THE COUNTER supplements/vitamins 7 days prior to surgery (Multiple Vitamin (MULTIVITAMIN WITH MINERALS) TABS  tablet)  No Alcohol for 24 hours before or after surgery.  No Smoking including e-cigarettes for 24 hours prior to surgery.  No chewable tobacco products for at least 6 hours prior to surgery.  No nicotine patches on the day of surgery.  Do not use any "recreational" drugs for at least a week prior to your surgery.  Please be advised that the combination of cocaine and anesthesia may have negative outcomes, up to and including death. If you test positive for cocaine, your surgery will be cancelled.  On the morning of surgery brush your teeth with toothpaste and water, you may rinse your mouth with mouthwash if you wish. Do not swallow any toothpaste or mouthwash.  Use CHG Soap as directed on instruction sheet.  Do not wear jewelry, make-up, hairpins, clips or nail polish.  Do not wear lotions, powders, or perfumes.   Do not shave body from the neck down 48 hours prior to surgery just in case you cut yourself which could leave a site for infection.  Also, freshly shaved skin may become irritated if using the CHG soap.  Contact lenses, hearing aids and dentures may not be worn into surgery.  Do not bring valuables to the hospital. [facility] is not responsible for any missing/lost belongings or valuables.   Notify your doctor if there is any change in your medical condition (cold, fever, infection).  Wear comfortable clothing (specific to your surgery type) to the hospital.  After surgery, you can help prevent lung complications by doing breathing exercises.  Take deep breaths and cough every 1-2 hours.  Your doctor may order a device called an Incentive Spirometer to help you take deep breaths. When coughing or sneezing, hold a pillow firmly against your incision with both hands. This is called "splinting." Doing this helps protect your incision. It also decreases belly discomfort.  If you are being admitted to the hospital overnight, leave your suitcase in the car. After surgery  it may be brought to your room.  If you are being discharged the day of surgery, you will not be allowed to drive home. You will need a responsible adult (18 years or older) to drive you home and stay with you that night.   If you are taking public transportation, you will need to have a responsible adult (18 years or older) with you. Please confirm with your physician that it is acceptable to use public transportation.   Please call the [facility] Dept. at [phone] if you have any questions about these instructions.  Surgery Visitation Policy:  Patients undergoing a surgery or procedure may have one family member or support person with them as long as that person is not COVID-19 positive or experiencing its symptoms.  That person may remain in the waiting area during the procedure and may rotate out with other people.  Inpatient Visitation:    Visiting hours are 7 a.m. to 8 p.m. Up to two visitors ages 16+ are allowed at one time in a patient room. The visitors may rotate out with other people during the day. Visitors must check out when they leave, or other visitors will not be allowed. One designated support person may remain overnight. The visitor must pass COVID-19 screenings, use hand sanitizer when entering and exiting the patient's room and wear a mask at all times, including in the patient's room. Patients must also wear a mask when staff or their visitor are in the room. Masking is required regardless of vaccination status.

## 2021-05-13 ENCOUNTER — Encounter
Admission: RE | Admit: 2021-05-13 | Discharge: 2021-05-13 | Disposition: A | Discharge status: Home / Self Care | Payer: Medicare HMO | Source: Ambulatory Visit | Attending: Surgery | Admitting: Surgery

## 2021-05-13 DIAGNOSIS — Z01818 Encounter for other preprocedural examination: Secondary | ICD-10-CM | POA: Diagnosis present

## 2021-05-13 DIAGNOSIS — Z79899 Other long term (current) drug therapy: Secondary | ICD-10-CM | POA: Insufficient documentation

## 2021-05-13 DIAGNOSIS — Z8673 Personal history of transient ischemic attack (TIA), and cerebral infarction without residual deficits: Secondary | ICD-10-CM | POA: Diagnosis not present

## 2021-05-13 DIAGNOSIS — I639 Cerebral infarction, unspecified: Secondary | ICD-10-CM | POA: Diagnosis not present

## 2021-05-13 LAB — TYPE AND SCREEN
ABO/RH(D): B POS
Antibody Screen: NEGATIVE

## 2021-05-13 LAB — POTASSIUM: Potassium: 3.9 mmol/L (ref 3.5–5.1)

## 2021-05-21 ENCOUNTER — Other Ambulatory Visit
Admission: RE | Admit: 2021-05-21 | Discharge: 2021-05-21 | Disposition: A | Discharge status: Home / Self Care | Payer: Medicare HMO | Source: Ambulatory Visit | Attending: Surgery | Admitting: Surgery

## 2021-05-21 ENCOUNTER — Other Ambulatory Visit: Payer: Self-pay

## 2021-05-21 DIAGNOSIS — Z20822 Contact with and (suspected) exposure to covid-19: Secondary | ICD-10-CM | POA: Diagnosis not present

## 2021-05-21 DIAGNOSIS — Z01812 Encounter for preprocedural laboratory examination: Secondary | ICD-10-CM | POA: Diagnosis not present

## 2021-05-22 LAB — SARS CORONAVIRUS 2 (TAT 6-24 HRS): SARS Coronavirus 2: NEGATIVE

## 2021-05-26 ENCOUNTER — Encounter
Admission: RE | Disposition: A | Discharge status: Home / Self Care | Payer: Self-pay | Source: Home / Self Care | Attending: Surgery

## 2021-05-26 ENCOUNTER — Inpatient Hospital Stay: Payer: Medicare HMO | Admitting: Anesthesiology

## 2021-05-26 ENCOUNTER — Inpatient Hospital Stay
Admission: RE | Admit: 2021-05-26 | Discharge: 2021-05-29 | DRG: 330 | Disposition: A | Discharge status: Home / Self Care | Payer: Medicare HMO | Attending: Surgery | Admitting: Surgery

## 2021-05-26 ENCOUNTER — Encounter: Payer: Self-pay | Admitting: Surgery

## 2021-05-26 ENCOUNTER — Other Ambulatory Visit: Payer: Self-pay

## 2021-05-26 DIAGNOSIS — E1122 Type 2 diabetes mellitus with diabetic chronic kidney disease: Secondary | ICD-10-CM | POA: Diagnosis present

## 2021-05-26 DIAGNOSIS — K5732 Diverticulitis of large intestine without perforation or abscess without bleeding: Principal | ICD-10-CM | POA: Diagnosis present

## 2021-05-26 DIAGNOSIS — R710 Precipitous drop in hematocrit: Secondary | ICD-10-CM | POA: Diagnosis not present

## 2021-05-26 DIAGNOSIS — F419 Anxiety disorder, unspecified: Secondary | ICD-10-CM | POA: Diagnosis not present

## 2021-05-26 DIAGNOSIS — Z79899 Other long term (current) drug therapy: Secondary | ICD-10-CM | POA: Diagnosis not present

## 2021-05-26 DIAGNOSIS — Z888 Allergy status to other drugs, medicaments and biological substances status: Secondary | ICD-10-CM | POA: Diagnosis not present

## 2021-05-26 DIAGNOSIS — N1832 Chronic kidney disease, stage 3b: Secondary | ICD-10-CM | POA: Diagnosis present

## 2021-05-26 DIAGNOSIS — J0110 Acute frontal sinusitis, unspecified: Secondary | ICD-10-CM

## 2021-05-26 DIAGNOSIS — E785 Hyperlipidemia, unspecified: Secondary | ICD-10-CM | POA: Diagnosis present

## 2021-05-26 DIAGNOSIS — K5730 Diverticulosis of large intestine without perforation or abscess without bleeding: Secondary | ICD-10-CM | POA: Diagnosis not present

## 2021-05-26 DIAGNOSIS — Z885 Allergy status to narcotic agent status: Secondary | ICD-10-CM

## 2021-05-26 DIAGNOSIS — K219 Gastro-esophageal reflux disease without esophagitis: Secondary | ICD-10-CM | POA: Diagnosis present

## 2021-05-26 DIAGNOSIS — Z7982 Long term (current) use of aspirin: Secondary | ICD-10-CM

## 2021-05-26 DIAGNOSIS — J45909 Unspecified asthma, uncomplicated: Secondary | ICD-10-CM | POA: Diagnosis not present

## 2021-05-26 DIAGNOSIS — Z7984 Long term (current) use of oral hypoglycemic drugs: Secondary | ICD-10-CM

## 2021-05-26 DIAGNOSIS — K5792 Diverticulitis of intestine, part unspecified, without perforation or abscess without bleeding: Secondary | ICD-10-CM | POA: Diagnosis present

## 2021-05-26 DIAGNOSIS — F17220 Nicotine dependence, chewing tobacco, uncomplicated: Secondary | ICD-10-CM | POA: Diagnosis present

## 2021-05-26 DIAGNOSIS — Z825 Family history of asthma and other chronic lower respiratory diseases: Secondary | ICD-10-CM | POA: Diagnosis not present

## 2021-05-26 HISTORY — PX: COLON SURGERY: SHX602

## 2021-05-26 LAB — CBC
HCT: 42.4 % (ref 39.0–52.0)
Hemoglobin: 14.6 g/dL (ref 13.0–17.0)
MCH: 30.9 pg (ref 26.0–34.0)
MCHC: 34.4 g/dL (ref 30.0–36.0)
MCV: 89.6 fL (ref 80.0–100.0)
Platelets: 171 10*3/uL (ref 150–400)
RBC: 4.73 MIL/uL (ref 4.22–5.81)
RDW: 12 % (ref 11.5–15.5)
WBC: 12.8 10*3/uL — ABNORMAL HIGH (ref 4.0–10.5)
nRBC: 0 % (ref 0.0–0.2)

## 2021-05-26 LAB — CREATININE, SERUM
Creatinine, Ser: 1.64 mg/dL — ABNORMAL HIGH (ref 0.61–1.24)
GFR, Estimated: 48 mL/min — ABNORMAL LOW (ref >60)

## 2021-05-26 LAB — ABO/RH: ABO/RH(D): B POS

## 2021-05-26 SURGERY — COLECTOMY, SIGMOID, ROBOT-ASSISTED
Anesthesia: General | Site: Abdomen

## 2021-05-26 MED ORDER — ACETAMINOPHEN 500 MG PO TABS
1000.0000 mg | ORAL_TABLET | Freq: Four times a day (QID) | ORAL | Status: DC
  Administered 2021-05-26 – 2021-05-29 (×10): 1000 mg via ORAL
  Filled 2021-05-26 (×10): qty 2

## 2021-05-26 MED ORDER — ONDANSETRON HCL 4 MG/2ML IJ SOLN
INTRAMUSCULAR | Status: DC | PRN
  Administered 2021-05-26: 4 mg via INTRAVENOUS

## 2021-05-26 MED ORDER — IBUPROFEN 600 MG PO TABS
600.0000 mg | ORAL_TABLET | Freq: Four times a day (QID) | ORAL | Status: DC | PRN
  Filled 2021-05-26: qty 1

## 2021-05-26 MED ORDER — DEXAMETHASONE SODIUM PHOSPHATE 10 MG/ML IJ SOLN
INTRAMUSCULAR | Status: DC | PRN
  Administered 2021-05-26: 10 mg via INTRAVENOUS

## 2021-05-26 MED ORDER — EPHEDRINE 5 MG/ML INJ
INTRAVENOUS | Status: AC
  Filled 2021-05-26: qty 5

## 2021-05-26 MED ORDER — INDOCYANINE GREEN 25 MG IV SOLR
INTRAVENOUS | Status: DC | PRN
  Administered 2021-05-26: 5 mg via INTRAVENOUS

## 2021-05-26 MED ORDER — RIMEGEPANT SULFATE 75 MG PO TBDP: 75.0000 mg | ORAL_TABLET | Freq: Every day | ORAL | Status: DC | PRN

## 2021-05-26 MED ORDER — ACETAMINOPHEN 500 MG PO TABS
ORAL_TABLET | ORAL | Status: AC
  Administered 2021-05-26: 07:00:00 1000 mg via ORAL
  Filled 2021-05-26: qty 2

## 2021-05-26 MED ORDER — PROPOFOL 10 MG/ML IV BOLUS
INTRAVENOUS | Status: DC | PRN
  Administered 2021-05-26: 140 mg via INTRAVENOUS
  Administered 2021-05-26: 20 mg via INTRAVENOUS

## 2021-05-26 MED ORDER — LACTATED RINGERS IV SOLN: INTRAVENOUS | Status: DC

## 2021-05-26 MED ORDER — DIPHENHYDRAMINE HCL 50 MG/ML IJ SOLN: 25.0000 mg | Freq: Four times a day (QID) | INTRAMUSCULAR | Status: DC | PRN

## 2021-05-26 MED ORDER — ONDANSETRON HCL 4 MG/2ML IJ SOLN: 4.0000 mg | Freq: Once | INTRAMUSCULAR | Status: DC | PRN

## 2021-05-26 MED ORDER — FENTANYL CITRATE (PF) 250 MCG/5ML IJ SOLN
INTRAMUSCULAR | Status: AC
  Filled 2021-05-26: qty 5

## 2021-05-26 MED ORDER — FENTANYL CITRATE (PF) 100 MCG/2ML IJ SOLN: 25.0000 ug | INTRAMUSCULAR | Status: DC | PRN

## 2021-05-26 MED ORDER — VISTASEAL 10 ML SINGLE DOSE KIT
PACK | CUTANEOUS | Status: AC
  Filled 2021-05-26: qty 10

## 2021-05-26 MED ORDER — DEXAMETHASONE SODIUM PHOSPHATE 10 MG/ML IJ SOLN
INTRAMUSCULAR | Status: AC
  Filled 2021-05-26: qty 1

## 2021-05-26 MED ORDER — GLYCOPYRROLATE 0.2 MG/ML IJ SOLN
INTRAMUSCULAR | Status: DC | PRN
  Administered 2021-05-26: .2 mg via INTRAVENOUS

## 2021-05-26 MED ORDER — CHLORHEXIDINE GLUCONATE 0.12 % MT SOLN: 15.0000 mL | Freq: Once | OROMUCOSAL | Status: AC

## 2021-05-26 MED ORDER — NYSTATIN 100000 UNIT/ML MT SUSP: 5.0000 mL | Freq: Every day | OROMUCOSAL | Status: DC | PRN

## 2021-05-26 MED ORDER — CELECOXIB 200 MG PO CAPS: 200.0000 mg | ORAL_CAPSULE | ORAL | Status: AC

## 2021-05-26 MED ORDER — CELECOXIB 200 MG PO CAPS
200.0000 mg | ORAL_CAPSULE | Freq: Two times a day (BID) | ORAL | Status: DC
  Administered 2021-05-26 – 2021-05-29 (×6): 200 mg via ORAL
  Filled 2021-05-26 (×7): qty 1

## 2021-05-26 MED ORDER — ORAL CARE MOUTH RINSE: 15.0000 mL | Freq: Once | OROMUCOSAL | Status: AC

## 2021-05-26 MED ORDER — EPHEDRINE SULFATE 50 MG/ML IJ SOLN
INTRAMUSCULAR | Status: DC | PRN
  Administered 2021-05-26: 5 mg via INTRAVENOUS

## 2021-05-26 MED ORDER — BUPIVACAINE LIPOSOME 1.3 % IJ SUSP
INTRAMUSCULAR | Status: AC
  Filled 2021-05-26: qty 20

## 2021-05-26 MED ORDER — BUDESONIDE 0.25 MG/2ML IN SUSP
0.2500 mg | Freq: Two times a day (BID) | RESPIRATORY_TRACT | Status: DC
  Administered 2021-05-26 – 2021-05-29 (×6): 0.25 mg via RESPIRATORY_TRACT
  Filled 2021-05-26 (×6): qty 2

## 2021-05-26 MED ORDER — OXYCODONE HCL 5 MG PO TABS
5.0000 mg | ORAL_TABLET | ORAL | Status: DC | PRN
  Administered 2021-05-26 – 2021-05-28 (×3): 10 mg via ORAL
  Filled 2021-05-26 (×3): qty 2

## 2021-05-26 MED ORDER — ENOXAPARIN SODIUM 40 MG/0.4ML IJ SOSY
40.0000 mg | PREFILLED_SYRINGE | INTRAMUSCULAR | Status: DC
  Administered 2021-05-27 – 2021-05-29 (×3): 40 mg via SUBCUTANEOUS
  Filled 2021-05-26 (×3): qty 0.4

## 2021-05-26 MED ORDER — HYDROMORPHONE HCL 1 MG/ML IJ SOLN
0.5000 mg | INTRAMUSCULAR | Status: DC | PRN
  Administered 2021-05-26: 0.5 mg via INTRAVENOUS
  Filled 2021-05-26: qty 0.5

## 2021-05-26 MED ORDER — FENTANYL CITRATE (PF) 100 MCG/2ML IJ SOLN
INTRAMUSCULAR | Status: DC | PRN
  Administered 2021-05-26 (×3): 50 ug via INTRAVENOUS
  Administered 2021-05-26: 100 ug via INTRAVENOUS
  Administered 2021-05-26 (×2): 50 ug via INTRAVENOUS

## 2021-05-26 MED ORDER — SODIUM CHLORIDE (PF) 0.9 % IJ SOLN
INTRAMUSCULAR | Status: AC
  Filled 2021-05-26: qty 50

## 2021-05-26 MED ORDER — SUGAMMADEX SODIUM 200 MG/2ML IV SOLN
INTRAVENOUS | Status: DC | PRN
  Administered 2021-05-26 (×3): 50 mg via INTRAVENOUS

## 2021-05-26 MED ORDER — GABAPENTIN 300 MG PO CAPS: 300.0000 mg | ORAL_CAPSULE | ORAL | Status: AC

## 2021-05-26 MED ORDER — MEPERIDINE HCL 25 MG/ML IJ SOLN
INTRAMUSCULAR | Status: AC
  Filled 2021-05-26: qty 1

## 2021-05-26 MED ORDER — FLUTICASONE PROPIONATE 50 MCG/ACT NA SUSP
2.0000 | Freq: Two times a day (BID) | NASAL | Status: DC
  Administered 2021-05-26 – 2021-05-29 (×6): 2 via NASAL
  Filled 2021-05-26: qty 16

## 2021-05-26 MED ORDER — SODIUM CHLORIDE 0.9 % IV SOLN
2.0000 g | INTRAVENOUS | Status: AC
  Administered 2021-05-26: 08:00:00 2 g via INTRAVENOUS

## 2021-05-26 MED ORDER — MEPERIDINE HCL 25 MG/ML IJ SOLN
12.5000 mg | Freq: Once | INTRAMUSCULAR | Status: AC
  Administered 2021-05-26: 13:00:00 12.5 mg via INTRAVENOUS

## 2021-05-26 MED ORDER — MIDAZOLAM HCL 2 MG/2ML IJ SOLN
INTRAMUSCULAR | Status: AC
  Filled 2021-05-26: qty 2

## 2021-05-26 MED ORDER — BUSPIRONE HCL 15 MG PO TABS
15.0000 mg | ORAL_TABLET | Freq: Every evening | ORAL | Status: DC | PRN
  Administered 2021-05-27 – 2021-05-28 (×3): 15 mg via ORAL
  Filled 2021-05-26 (×5): qty 1

## 2021-05-26 MED ORDER — ALBUTEROL SULFATE (2.5 MG/3ML) 0.083% IN NEBU: 2.5000 mg | INHALATION_SOLUTION | Freq: Four times a day (QID) | RESPIRATORY_TRACT | Status: DC | PRN

## 2021-05-26 MED ORDER — CHLORHEXIDINE GLUCONATE CLOTH 2 % EX PADS
6.0000 | MEDICATED_PAD | Freq: Once | CUTANEOUS | Status: AC
  Administered 2021-05-26: 07:00:00 6 via TOPICAL

## 2021-05-26 MED ORDER — BUPIVACAINE HCL (PF) 0.5 % IJ SOLN
INTRAMUSCULAR | Status: DC | PRN
  Administered 2021-05-26: 30 mL

## 2021-05-26 MED ORDER — BUPIVACAINE HCL (PF) 0.5 % IJ SOLN
INTRAMUSCULAR | Status: AC
  Filled 2021-05-26: qty 30

## 2021-05-26 MED ORDER — VISTASEAL 10 ML SINGLE DOSE KIT
PACK | CUTANEOUS | Status: DC | PRN
  Administered 2021-05-26: 10 mL via TOPICAL

## 2021-05-26 MED ORDER — ONDANSETRON HCL 4 MG/2ML IJ SOLN: 4.0000 mg | Freq: Four times a day (QID) | INTRAMUSCULAR | Status: DC | PRN

## 2021-05-26 MED ORDER — NICOTINE 14 MG/24HR TD PT24
14.0000 mg | MEDICATED_PATCH | Freq: Every day | TRANSDERMAL | Status: DC
  Administered 2021-05-26 – 2021-05-29 (×4): 14 mg via TRANSDERMAL
  Filled 2021-05-26 (×4): qty 1

## 2021-05-26 MED ORDER — ROCURONIUM BROMIDE 100 MG/10ML IV SOLN
INTRAVENOUS | Status: DC | PRN
  Administered 2021-05-26 (×3): 10 mg via INTRAVENOUS
  Administered 2021-05-26: 60 mg via INTRAVENOUS
  Administered 2021-05-26: 20 mg via INTRAVENOUS

## 2021-05-26 MED ORDER — TRAMADOL HCL 50 MG PO TABS
50.0000 mg | ORAL_TABLET | Freq: Four times a day (QID) | ORAL | Status: DC | PRN
  Administered 2021-05-28: 50 mg via ORAL
  Filled 2021-05-26: qty 1

## 2021-05-26 MED ORDER — ONDANSETRON 4 MG PO TBDP: 4.0000 mg | ORAL_TABLET | Freq: Four times a day (QID) | ORAL | Status: DC | PRN

## 2021-05-26 MED ORDER — PROPOFOL 10 MG/ML IV BOLUS
INTRAVENOUS | Status: AC
  Filled 2021-05-26: qty 40

## 2021-05-26 MED ORDER — ONDANSETRON HCL 4 MG/2ML IJ SOLN
INTRAMUSCULAR | Status: AC
  Filled 2021-05-26: qty 2

## 2021-05-26 MED ORDER — GABAPENTIN 300 MG PO CAPS
300.0000 mg | ORAL_CAPSULE | Freq: Two times a day (BID) | ORAL | Status: DC
  Administered 2021-05-26 – 2021-05-29 (×6): 300 mg via ORAL
  Filled 2021-05-26 (×6): qty 1

## 2021-05-26 MED ORDER — DIPHENHYDRAMINE HCL 25 MG PO CAPS: 25.0000 mg | ORAL_CAPSULE | Freq: Four times a day (QID) | ORAL | Status: DC | PRN

## 2021-05-26 MED ORDER — CELECOXIB 200 MG PO CAPS
ORAL_CAPSULE | ORAL | Status: AC
  Administered 2021-05-26: 07:00:00 200 mg via ORAL
  Filled 2021-05-26: qty 1

## 2021-05-26 MED ORDER — CHLORHEXIDINE GLUCONATE 0.12 % MT SOLN
OROMUCOSAL | Status: AC
  Administered 2021-05-26: 07:00:00 15 mL via OROMUCOSAL
  Filled 2021-05-26: qty 15

## 2021-05-26 MED ORDER — GABAPENTIN 300 MG PO CAPS
ORAL_CAPSULE | ORAL | Status: AC
  Administered 2021-05-26: 07:00:00 300 mg via ORAL
  Filled 2021-05-26: qty 1

## 2021-05-26 MED ORDER — SODIUM CHLORIDE 0.9 % IV SOLN
INTRAVENOUS | Status: DC | PRN
  Administered 2021-05-26: 08:00:00 70 mL

## 2021-05-26 MED ORDER — ACETAMINOPHEN 500 MG PO TABS: 1000.0000 mg | ORAL_TABLET | ORAL | Status: AC

## 2021-05-26 MED ORDER — SODIUM CHLORIDE 0.9 % IV SOLN
INTRAVENOUS | Status: AC
  Filled 2021-05-26: qty 2

## 2021-05-26 MED ORDER — PANTOPRAZOLE SODIUM 40 MG IV SOLR
40.0000 mg | Freq: Every day | INTRAVENOUS | Status: DC
  Administered 2021-05-26 – 2021-05-27 (×2): 40 mg via INTRAVENOUS
  Filled 2021-05-26 (×2): qty 40

## 2021-05-26 MED ORDER — ROCURONIUM BROMIDE 10 MG/ML (PF) SYRINGE
PREFILLED_SYRINGE | INTRAVENOUS | Status: AC
  Filled 2021-05-26: qty 10

## 2021-05-26 MED ORDER — LORATADINE 10 MG PO TABS
10.0000 mg | ORAL_TABLET | Freq: Every day | ORAL | Status: DC
  Administered 2021-05-26 – 2021-05-29 (×4): 10 mg via ORAL
  Filled 2021-05-26 (×4): qty 1

## 2021-05-26 MED ORDER — GLYCOPYRROLATE 0.2 MG/ML IJ SOLN
INTRAMUSCULAR | Status: AC
  Filled 2021-05-26: qty 1

## 2021-05-26 MED ORDER — MIDAZOLAM HCL 2 MG/2ML IJ SOLN
INTRAMUSCULAR | Status: DC | PRN
  Administered 2021-05-26 (×2): 1 mg via INTRAVENOUS

## 2021-05-26 MED ORDER — LIDOCAINE HCL (PF) 2 % IJ SOLN
INTRAMUSCULAR | Status: AC
  Filled 2021-05-26: qty 5

## 2021-05-26 SURGICAL SUPPLY — 98 items
ADH SKN CLS APL DERMABOND .7 (GAUZE/BANDAGES/DRESSINGS) ×2
APL LAPSCP 35 DL APL RGD (MISCELLANEOUS) ×2
APL PRP STRL LF DISP 70% ISPRP (MISCELLANEOUS) ×2
APPLICATOR VISTASEAL 35 (MISCELLANEOUS) ×1 IMPLANT
BLADE CLIPPER SURG (BLADE) ×3 IMPLANT
BLADE SURG SZ10 CARB STEEL (BLADE) IMPLANT
BLADE SURG SZ11 CARB STEEL (BLADE) ×3 IMPLANT
BULB RESERV EVAC DRAIN JP 100C (MISCELLANEOUS) ×1 IMPLANT
CANNULA REDUC XI 12-8 STAPL (CANNULA) ×3
CANNULA REDUCER 12-8 DVNC XI (CANNULA) ×2 IMPLANT
CHLORAPREP W/TINT 26 (MISCELLANEOUS) ×3 IMPLANT
COVER TIP SHEARS 8 DVNC (MISCELLANEOUS) ×2 IMPLANT
COVER TIP SHEARS 8MM DA VINCI (MISCELLANEOUS) ×3
DEFOGGER SCOPE WARMER CLEARIFY (MISCELLANEOUS) ×3 IMPLANT
DERMABOND ADVANCED (GAUZE/BANDAGES/DRESSINGS) ×1
DERMABOND ADVANCED .7 DNX12 (GAUZE/BANDAGES/DRESSINGS) IMPLANT
DRAIN CHANNEL JP 15F RND 16 (MISCELLANEOUS) ×1 IMPLANT
DRAPE ARM DVNC X/XI (DISPOSABLE) ×8 IMPLANT
DRAPE COLUMN DVNC XI (DISPOSABLE) ×2 IMPLANT
DRAPE DA VINCI XI ARM (DISPOSABLE) ×12
DRAPE DA VINCI XI COLUMN (DISPOSABLE) ×3
DRAPE LEGGINS SURG 28X43 STRL (DRAPES) ×3 IMPLANT
DRAPE UNDER BUTTOCK W/FLU (DRAPES) ×3 IMPLANT
DRSG OPSITE POSTOP 3X4 (GAUZE/BANDAGES/DRESSINGS) ×1 IMPLANT
DRSG OPSITE POSTOP 4X10 (GAUZE/BANDAGES/DRESSINGS) IMPLANT
DRSG OPSITE POSTOP 4X6 (GAUZE/BANDAGES/DRESSINGS) ×3 IMPLANT
DRSG OPSITE POSTOP 4X8 (GAUZE/BANDAGES/DRESSINGS) IMPLANT
ELECT CAUTERY BLADE 6.4 (BLADE) IMPLANT
ELECT REM PT RETURN 9FT ADLT (ELECTROSURGICAL) ×3
ELECTRODE REM PT RTRN 9FT ADLT (ELECTROSURGICAL) ×2 IMPLANT
GAUZE 4X4 16PLY ~~LOC~~+RFID DBL (SPONGE) ×3 IMPLANT
GLOVE SURG SYN 6.5 ES PF (GLOVE) ×30 IMPLANT
GLOVE SURG SYN 6.5 PF PI (GLOVE) ×6 IMPLANT
GLOVE SURG UNDER POLY LF SZ7 (GLOVE) ×16 IMPLANT
GOWN STRL REUS W/ TWL LRG LVL3 (GOWN DISPOSABLE) ×12 IMPLANT
GOWN STRL REUS W/TWL LRG LVL3 (GOWN DISPOSABLE) ×30
GRASPER SUT TROCAR 14GX15 (MISCELLANEOUS) IMPLANT
HANDLE YANKAUER SUCT BULB TIP (MISCELLANEOUS) ×3 IMPLANT
IRRIGATION STRYKERFLOW (MISCELLANEOUS) IMPLANT
IRRIGATOR STRYKERFLOW (MISCELLANEOUS)
IV NS 1000ML (IV SOLUTION)
IV NS 1000ML BAXH (IV SOLUTION) IMPLANT
KIT IMAGING PINPOINTPAQ (MISCELLANEOUS) ×1 IMPLANT
KIT PINK PAD W/HEAD ARE REST (MISCELLANEOUS) ×3
KIT PINK PAD W/HEAD ARM REST (MISCELLANEOUS) ×2 IMPLANT
LABEL OR SOLS (LABEL) IMPLANT
MANIFOLD NEPTUNE II (INSTRUMENTS) ×3 IMPLANT
NEEDLE HYPO 22GX1.5 SAFETY (NEEDLE) ×3 IMPLANT
OBTURATOR OPTICAL STANDARD 8MM (TROCAR) ×3
OBTURATOR OPTICAL STND 8 DVNC (TROCAR) ×2
OBTURATOR OPTICALSTD 8 DVNC (TROCAR) ×2 IMPLANT
PACK COLON CLEAN CLOSURE (MISCELLANEOUS) ×3 IMPLANT
PACK LAP CHOLECYSTECTOMY (MISCELLANEOUS) ×3 IMPLANT
PENCIL ELECTRO HAND CTR (MISCELLANEOUS) ×3 IMPLANT
RELOAD STAPLE 60 3.5 BLU DVNC (STAPLE) IMPLANT
RELOAD STAPLE 60 4.3 GRN DVNC (STAPLE) IMPLANT
RELOAD STAPLER 3.5X60 BLU DVNC (STAPLE) IMPLANT
RELOAD STAPLER 4.3X60 GRN DVNC (STAPLE) ×6 IMPLANT
SEAL CANN UNIV 5-8 DVNC XI (MISCELLANEOUS) ×6 IMPLANT
SEAL XI 5MM-8MM UNIVERSAL (MISCELLANEOUS) ×9
SEALER VESSEL DA VINCI XI (MISCELLANEOUS) ×3
SEALER VESSEL EXT DVNC XI (MISCELLANEOUS) IMPLANT
SET TRI-LUMEN FLTR TB AIRSEAL (TUBING) ×3 IMPLANT
SOL PREP PVP 2OZ (MISCELLANEOUS) ×3
SOLUTION ELECTROLUBE (MISCELLANEOUS) ×3 IMPLANT
SOLUTION PREP PVP 2OZ (MISCELLANEOUS) ×2 IMPLANT
SPONGE T-LAP 18X18 ~~LOC~~+RFID (SPONGE) ×3 IMPLANT
SPONGE T-LAP 4X18 ~~LOC~~+RFID (SPONGE) ×1 IMPLANT
STAPLER 60 DA VINCI SURE FORM (STAPLE)
STAPLER 60 SUREFORM DVNC (STAPLE) IMPLANT
STAPLER CANNULA SEAL DVNC XI (STAPLE) ×2 IMPLANT
STAPLER CANNULA SEAL XI (STAPLE) ×3
STAPLER CIRCULAR MANUAL XL 25 (STAPLE) IMPLANT
STAPLER CIRCULAR MANUAL XL 29 (STAPLE) ×1 IMPLANT
STAPLER CIRCULAR MANUAL XL 33 (STAPLE) IMPLANT
STAPLER RELOAD 3.5X60 BLU DVNC (STAPLE)
STAPLER RELOAD 3.5X60 BLUE (STAPLE)
STAPLER RELOAD 4.3X60 GREEN (STAPLE) ×9
STAPLER RELOAD 4.3X60 GRN DVNC (STAPLE) ×6
STAPLER SKIN PROX 35W (STAPLE) ×1 IMPLANT
SURGILUBE 2OZ TUBE FLIPTOP (MISCELLANEOUS) ×3 IMPLANT
SUT DVC VLOC 90 3-0 CV23 VLT (SUTURE) ×3
SUT MNCRL AB 4-0 PS2 18 (SUTURE) ×6 IMPLANT
SUT PDS AB 1 CT1 36 (SUTURE) ×6 IMPLANT
SUT SILK 3 0 SH 30 (SUTURE) IMPLANT
SUT VIC AB 3-0 SH 27 (SUTURE) ×3
SUT VIC AB 3-0 SH 27X BRD (SUTURE) ×2 IMPLANT
SUTURE DVC VLC 90 3-0 CV23 VLT (SUTURE) IMPLANT
SYR 30ML LL (SYRINGE) ×6 IMPLANT
SYS LAPSCP GELPORT 120MM (MISCELLANEOUS) ×3
SYS TROCAR 1.5-3 SLV ABD GEL (ENDOMECHANICALS) ×6
SYSTEM LAPSCP GELPORT 120MM (MISCELLANEOUS) ×2 IMPLANT
SYSTEM TROCR 1.5-3 SLV ABD GEL (ENDOMECHANICALS) ×2 IMPLANT
SYSTEM WECK SHIELD CLOSURE (TROCAR) IMPLANT
TRAY FOLEY MTR SLVR 16FR STAT (SET/KITS/TRAYS/PACK) ×3 IMPLANT
TROCAR PORT AIRSEAL 5X120 (TROCAR) ×1 IMPLANT
TROCAR XCEL NON-BLD 5MMX100MML (ENDOMECHANICALS) IMPLANT
WATER STERILE IRR 500ML POUR (IV SOLUTION) ×2 IMPLANT

## 2021-05-26 NOTE — Op Note (Addendum)
Preoperative diagnosis: Chronic diverticulitis  Postoperative diagnosis: Same  Procedure: Robotic assisted laparoscopic sigmoidectomy, splenic flexure takedown   Anesthesia: GETA   Surgeon: Benjamine Sprague Assistant: Cintron for exposure and bedside assist    Wound Classification: clean contaminated   Specimen: Sigmoid colon   Complications: None   Estimated Blood Loss: 100 mL  Indications:  Please see H&P for further details.     FIndings: 1.  Sigmoid colon with thickened wall, no abscess 2.  Successful EEA anastomosis with no air leak  3.  Normal anatomy 4.  Adequate hemostasis.     Description of procedure: The patient was placed on the operating table in the low lithotomy position, both arms tucked. General anesthesia was induced.  Foley placed. A time-out was completed verifying correct patient, procedure, site, positioning, and implant(s) and/or special equipment prior to beginning this procedure. The abdomen was prepped and draped in the usual sterile fashion.    Veress needle placed through Palmer's point and after confirming 2 clicks and a positive saline drop test, insufflation started with no dramatic increase in pressure up to 15 mmHg.  Veress needle removed and 5 mm port placed.  Optiview technique through the same location.  Inspection of the abdominal cavity noted no injury to the surrounding area.  Exparel infused as a tap block.    4 ports were then placed along the right abdominal wall, One 12 mm in the right lower quadrant and three 8 mm ports.  The table was placed in the Trendelenburg, right side down position. Xi robotic platform was then brought to the operative field and docked.  Instruments all placed in the typical operative field.  An additional 8 mm air seal port was then placed in between ports, as a assistant port.   Inspection of the sigmoid colon, noted thickened colon wall and lateral attachments consistent with chronic diverticulitis.  No abscess is  noted.  Lateral attachments were taken down using sharp dissection.  Area proximal to the thickened colon wall was chosen for anvil insertion site.  Vessel sealer then used to transect the mesentery at from this point down to the rectum.   Right lower quadrant 12 mm port was then removed from the right lower quadrant and port site extended to accommodate a GelPort.  29 mm EEA stapler anvil was placed through the GelPort, 12 mm port replaced within the port.  ICG was infused and adequate circulation was noted at the planned staple lines. A longitudinal incision was made in the colon with scissors distal to the previously marked proximal transection site to accommodate the anvil.  The tip of the anvil was then placed at the point of the previous marked site and a small hole was made to accommodate the anvil tip through the colon wall.  3-0 V lock was then used to close the initial hole made to insert the anvil within the colon and then a 60 mm green load stapler was used to transect immediately proximal to the closed hole.  Another 60 mm green load stapler was then used to transect the colon at the rectum and the specimen was removed through the right lower quadrant GelPort site.  Passed off operative field pending pathology.  Anvil insertion site was noted to not reach the rectal stump, so additional dissection was carried around the splenic flexure, and portion of the omentum was separated from the transverse colon using vessel sealer to allow the proximal stump to reach the rectal stump under minimal tension.  EEA stapler placed through the rectal stump and guided to the distal staple line and a side-to-side anastomosis was created, after confirming no twisting of the proximal mesentery and no tension noted on the staple line.  Saline was infused into the pelvis, and a air leak test was performed and anastomosis did not show any leaks.  All saline was then suctioned out, anastomosis looked viable, and  hemostasis was noted throughout the abdomen.  Vistaseal infused around the staple line for additional security.  3 [place] Blake drain then pushed through the assistant port and placed over the staple line, secured to the skin using 3-0 nylon   Clean closure protocol initiated.  Lower quadrant gelport site closed with 1 PDS x2.  3-0 Vicryl used to approximate the subcutaneous tissue prior to closing all skin sites with staples, then dressed with honeycomb dressing.  JP drain site dressed with drain sponge and paper tape.  The patient tolerated the procedure well, awakened from anesthesia and was taken to the postanesthesia care unit in satisfactory condition with Foley in place.  Sponge count correct at end of procedure.

## 2021-05-26 NOTE — Interval H&P Note (Signed)
History and Physical Interval Note:  05/26/2021 1:44 PM  Randy Dean  has presented today for surgery, with the diagnosis of K57.32 Diverticulitis of large intestine without bleeding.  The various methods of treatment have been discussed with the patient and family. After consideration of risks, benefits and other options for treatment, the patient has consented to  Procedure(s): XI ROBOT ASSISTED SIGMOID COLECTOMY (N/A) INDOCYANINE GREEN FLUORESCENCE IMAGING (ICG) as a surgical intervention.  The patient's history has been reviewed, patient examined, no change in status, stable for surgery.  I have reviewed the patient's chart and labs.  Questions were answered to the patient's satisfaction.     Randy Dean

## 2021-05-26 NOTE — Anesthesia Procedure Notes (Signed)
Procedure Name: Intubation Date/Time: 05/26/2021 7:44 AM Performed by: Loni Dolly, CRNA Pre-anesthesia Checklist: Patient identified, Emergency Drugs available, Suction available and Patient being monitored Patient Re-evaluated:Patient Re-evaluated prior to induction Oxygen Delivery Method: Circle system utilized Preoxygenation: Pre-oxygenation with 100% oxygen Induction Type: IV induction Ventilation: Mask ventilation without difficulty Laryngoscope Size: McGraph and 4 Grade View: Grade I Tube type: Oral Tube size: 7.5 mm Number of attempts: 1 Airway Equipment and Method: Stylet and Oral airway Placement Confirmation: ETT inserted through vocal cords under direct vision, positive ETCO2 and breath sounds checked- equal and bilateral Secured at: 22 cm Tube secured with: Tape Dental Injury: Teeth and Oropharynx as per pre-operative assessment

## 2021-05-26 NOTE — Transfer of Care (Signed)
Immediate Anesthesia Transfer of Care Note  Patient: Randy Dean  Procedure(s) Performed: XI ROBOT ASSISTED SIGMOID COLECTOMY (Abdomen) INDOCYANINE GREEN FLUORESCENCE IMAGING (ICG)  Patient Location: PACU  Anesthesia Type:General  Level of Consciousness: responds to stimulation  Airway & Oxygen Therapy: Patient Spontanous Breathing and Patient connected to face mask oxygen  Post-op Assessment: Report given to RN and Post -op Vital signs reviewed and stable  Post vital signs: Reviewed and stable  Last Vitals:  Vitals Value Taken Time  BP 189/95 05/26/21 1236  Temp 36 C 05/26/21 1236  Pulse 93 05/26/21 1241  Resp 21 05/26/21 1241  SpO2 100 % 05/26/21 1241  Vitals shown include unvalidated device data.  Last Pain:  Vitals:   05/26/21 1236  TempSrc:   PainSc: 0-No pain         Complications: No notable events documented.

## 2021-05-26 NOTE — Anesthesia Preprocedure Evaluation (Signed)
Anesthesia Evaluation  Patient identified by MRN, date of birth, ID band Patient awake    Reviewed: Allergy & Precautions, NPO status , Patient's Chart, lab work & pertinent test results  Airway Mallampati: III  TM Distance: >3 FB Neck ROM: Limited  Mouth opening: Limited Mouth Opening  Dental  (+) Teeth Intact   Pulmonary neg pulmonary ROS, shortness of breath and with exertion, former smoker,    Pulmonary exam normal  + decreased breath sounds (-) stridor     Cardiovascular Exercise Tolerance: Poor negative cardio ROS Normal cardiovascular exam Rhythm:Regular Rate:Normal     Neuro/Psych  Headaches, Anxiety Depression TIAnegative neurological ROS  negative psych ROS   GI/Hepatic negative GI ROS, Neg liver ROS, GERD  ,  Endo/Other  negative endocrine ROS  Renal/GU      Musculoskeletal  (+) Arthritis ,   Abdominal (+) + obese,   Peds negative pediatric ROS (+)  Hematology negative hematology ROS (+)   Anesthesia Other Findings Past Medical History: 10/30/2015: Abnormality of gait No date: Allergic rhinitis No date: Anxiety No date: Arthritis No date: Asthma     Comment:  well controlled No date: BPH (benign prostatic hyperplasia) No date: Chronic headaches     Comment:  partially sinus/partially brain injury (per pt) No date: Claustrophobia No date: Complication of anesthesia     Comment:  hard to wake up after 1 surgery but pt had had               3surgeries close together No date: Dyspnea No date: GERD (gastroesophageal reflux disease) 2010: Head trauma     Comment:  has caused anxiety, memory issues and neck problems 10/30/2015: Headache     Comment:  Right temporal  06/2019: Ischemic stroke ([place]) No date: Kidney damage     Comment:  due to years of taking meloxicam after head injury No date: Neck stiffness     Comment:  metal plate.  mild limitations of side to side and up               and dowm  movement No date: Prediabetes No date: PTSD (post-traumatic stress disorder) No date: RAD (reactive airway disease) No date: Spinal cord compression (HCC)     Comment:  -cervical from brain trauma No date: Stage 3b chronic kidney disease (HCC) No date: Stroke [facility])     Comment:  TIA x3- some residual right side weakness No date: Vertigo No date: Weakness of right side of body     Comment:  from TIAs  Past Surgical History: 03/02/2019: COLONOSCOPY WITH PROPOFOL; N/A     Comment:  Procedure: COLONOSCOPY WITH PROPOFOL;  Surgeon: Benjamine Sprague, DO;  Location: [facility] ENDOSCOPY;  Service: General;               Laterality: N/A; No date: DIAGNOSTIC LAPAROSCOPY 08/22/2015: ENDOSCOPIC TURBINATE REDUCTION; Bilateral     Comment:  Procedure: ENDOSCOPIC TURBINATE REDUCTION;  Surgeon:               Margaretha Sheffield, MD;  Location: [facility];                Service: ENT;  Laterality: Bilateral; No date: EYE MUSCLE SURGERY; Left     Comment:  Due to conential left amblyopia x2 No date: FINGER FRACTURE SURGERY; Right     Comment:  index finger 02/28/2015: NASAL SINUS SURGERY No date: NECK SURGERY  Comment:  x3-metal plate in neck No date: SHOULDER SURGERY  BMI    Body Mass Index: 28.21 kg/m      Reproductive/Obstetrics negative OB ROS                             Anesthesia Physical Anesthesia Plan  ASA: 3  Anesthesia Plan: General   Post-op Pain Management:    Induction: Intravenous  PONV Risk Score and Plan: 1 and Ondansetron  Airway Management Planned: Oral ETT  Additional Equipment:   Intra-op Plan:   Post-operative Plan: Extubation in OR  Informed Consent: I have reviewed the patients History and Physical, chart, labs and discussed the procedure including the risks, benefits and alternatives for the proposed anesthesia with the patient or authorized representative who has indicated his/her understanding and acceptance.      Dental Advisory Given  Plan Discussed with: CRNA and Surgeon  Anesthesia Plan Comments:         Anesthesia Quick Evaluation

## 2021-05-27 LAB — BASIC METABOLIC PANEL
Anion gap: 5 (ref 5–15)
BUN: 19 mg/dL (ref 6–20)
CO2: 25 mmol/L (ref 22–32)
Calcium: 8.3 mg/dL — ABNORMAL LOW (ref 8.9–10.3)
Chloride: 105 mmol/L (ref 98–111)
Creatinine, Ser: 1.6 mg/dL — ABNORMAL HIGH (ref 0.61–1.24)
GFR, Estimated: 50 mL/min — ABNORMAL LOW (ref >60)
Glucose, Bld: 136 mg/dL — ABNORMAL HIGH (ref 70–99)
Potassium: 4.6 mmol/L (ref 3.5–5.1)
Sodium: 135 mmol/L (ref 135–145)

## 2021-05-27 LAB — CBC
HCT: 39.1 % (ref 39.0–52.0)
Hemoglobin: 13.6 g/dL (ref 13.0–17.0)
MCH: 31.6 pg (ref 26.0–34.0)
MCHC: 34.8 g/dL (ref 30.0–36.0)
MCV: 90.7 fL (ref 80.0–100.0)
Platelets: 175 10*3/uL (ref 150–400)
RBC: 4.31 MIL/uL (ref 4.22–5.81)
RDW: 12 % (ref 11.5–15.5)
WBC: 13.5 10*3/uL — ABNORMAL HIGH (ref 4.0–10.5)
nRBC: 0 % (ref 0.0–0.2)

## 2021-05-27 MED ORDER — CHLORHEXIDINE GLUCONATE CLOTH 2 % EX PADS
6.0000 | MEDICATED_PAD | Freq: Every day | CUTANEOUS | Status: DC
  Administered 2021-05-27 – 2021-05-29 (×3): 6 via TOPICAL

## 2021-05-27 NOTE — Progress Notes (Signed)
Subjective:  CC: Randy Dean is a 55 y.o. male  Hospital stay day 1, 1 Day Post-Op robotic assisted lap sigmoidectomy for diverticulitis  HPI: No acute issues overnight.  Ambulating, pain controlled.  Feels gas, but has not had actual flatulance yet.  ROS:  General: Denies weight loss, weight gain, fatigue, fevers, chills, and night sweats. Heart: Denies chest pain, palpitations, racing heart, irregular heartbeat, leg pain or swelling, and decreased activity tolerance. Respiratory: Denies breathing difficulty, shortness of breath, wheezing, cough, and sputum. GI: Denies change in appetite, heartburn, nausea, vomiting, constipation, diarrhea, and blood in stool. GU: Denies difficulty urinating, pain with urinating, urgency, frequency, blood in urine.   Objective:   Temp:  [96.8 F (36 C)-98.5 F (36.9 C)] 98.5 F (36.9 C) (11/29 0800) Pulse Rate:  [76-100] 76 (11/29 0800) Resp:  [14-25] 18 (11/29 0800) BP: (129-189)/(76-95) 131/77 (11/29 0800) SpO2:  [90 %-100 %] 97 % (11/29 0814)     Height: 6' (182.9 cm) Weight: 94.3 kg BMI (Calculated): 28.2   Intake/Output this shift:   Intake/Output Summary (Last 24 hours) at 05/27/2021 1202 Last data filed at 05/27/2021 0300 Gross per 24 hour  Intake 1005 ml  Output 285 ml  Net 720 ml    Constitutional :  alert, cooperative, appears stated age, and no distress  Respiratory:  clear to auscultation bilaterally  Cardiovascular:  regular rate and rhythm  Gastrointestinal: Soft, tender to incision sites as expected.  JP with serosanguinours drainage .   Skin: Cool and moist. Staples c/d/i  Psychiatric: Normal affect, non-agitated, not confused       LABS:  CMP Latest Ref Rng & Units 05/27/2021 05/26/2021 05/13/2021  Glucose 70 - 99 mg/dL 136(H) - -  BUN 6 - 20 mg/dL 19 - -  Creatinine 0.61 - 1.24 mg/dL 1.60(H) 1.64(H) -  Sodium 135 - 145 mmol/L 135 - -  Potassium 3.5 - 5.1 mmol/L 4.6 - 3.9  Chloride 98 - 111 mmol/L 105 - -   CO2 22 - 32 mmol/L 25 - -  Calcium 8.9 - 10.3 mg/dL 8.3(L) - -  Total Protein 6.0 - 8.5 g/dL - - -  Total Bilirubin 0.0 - 1.2 mg/dL - - -  Alkaline Phos 44 - 121 IU/L - - -  AST 0 - 40 IU/L - - -  ALT 0 - 44 IU/L - - -   CBC Latest Ref Rng & Units 05/27/2021 05/26/2021 09/16/2020  WBC 4.0 - 10.5 K/uL 13.5(H) 12.8(H) 6.2  Hemoglobin 13.0 - 17.0 g/dL 13.6 14.6 15.8  Hematocrit 39.0 - 52.0 % 39.1 42.4 43.7  Platelets 150 - 400 K/uL 175 171 183    RADS: N/a Assessment:   S/p robotic assisted lap sigmoidectomy for chronic diverticulitis.  Doing well.  Foley out, CLD, continue IVF for now, ambulate. Possible diet advancement once passing flatus, having BM

## 2021-05-27 NOTE — Plan of Care (Signed)
  Problem: Education: Goal: Required Educational Video(s) Outcome: Progressing   Problem: Clinical Measurements: Goal: Ability to maintain clinical measurements within normal limits will improve Outcome: Progressing Goal: Postoperative complications will be avoided or minimized Outcome: Progressing   Problem: Skin Integrity: Goal: Demonstration of wound healing without infection will improve Outcome: Progressing   

## 2021-05-28 ENCOUNTER — Encounter: Payer: Self-pay | Admitting: Surgery

## 2021-05-28 LAB — BASIC METABOLIC PANEL
Anion gap: 5 (ref 5–15)
BUN: 25 mg/dL — ABNORMAL HIGH (ref 6–20)
CO2: 23 mmol/L (ref 22–32)
Calcium: 8.2 mg/dL — ABNORMAL LOW (ref 8.9–10.3)
Chloride: 107 mmol/L (ref 98–111)
Creatinine, Ser: 1.64 mg/dL — ABNORMAL HIGH (ref 0.61–1.24)
GFR, Estimated: 48 mL/min — ABNORMAL LOW (ref >60)
Glucose, Bld: 81 mg/dL (ref 70–99)
Potassium: 4 mmol/L (ref 3.5–5.1)
Sodium: 135 mmol/L (ref 135–145)

## 2021-05-28 LAB — CBC
HCT: 36.6 % — ABNORMAL LOW (ref 39.0–52.0)
Hemoglobin: 12.4 g/dL — ABNORMAL LOW (ref 13.0–17.0)
MCH: 31 pg (ref 26.0–34.0)
MCHC: 33.9 g/dL (ref 30.0–36.0)
MCV: 91.5 fL (ref 80.0–100.0)
Platelets: 147 10*3/uL — ABNORMAL LOW (ref 150–400)
RBC: 4 MIL/uL — ABNORMAL LOW (ref 4.22–5.81)
RDW: 12.4 % (ref 11.5–15.5)
WBC: 9.4 10*3/uL (ref 4.0–10.5)
nRBC: 0 % (ref 0.0–0.2)

## 2021-05-28 LAB — SURGICAL PATHOLOGY

## 2021-05-28 MED ORDER — PANTOPRAZOLE SODIUM 40 MG PO TBEC
40.0000 mg | DELAYED_RELEASE_TABLET | Freq: Every day | ORAL | Status: DC
  Administered 2021-05-28: 40 mg via ORAL
  Filled 2021-05-28: qty 1

## 2021-05-28 NOTE — Progress Notes (Signed)
Subjective:  CC: Randy Dean is a 55 y.o. male  Hospital stay day 2, 2 Days Post-Op robotic assisted lap sigmoidectomy for diverticulitis  HPI: No acute issues overnight.  Ambulating, pain controlled.  Still feels gas, but has not had actual flatulance yet.  ROS:  General: Denies weight loss, weight gain, fatigue, fevers, chills, and night sweats. Heart: Denies chest pain, palpitations, racing heart, irregular heartbeat, leg pain or swelling, and decreased activity tolerance. Respiratory: Denies breathing difficulty, shortness of breath, wheezing, cough, and sputum. GI: Denies change in appetite, heartburn, nausea, vomiting, constipation, diarrhea, and blood in stool. GU: Denies difficulty urinating, pain with urinating, urgency, frequency, blood in urine.   Objective:   Temp:  [97.9 F (36.6 C)-98.2 F (36.8 C)] 97.9 F (36.6 C) (11/30 0835) Pulse Rate:  [74-84] 74 (11/30 0835) Resp:  [16-18] 18 (11/30 0835) BP: (116-149)/(77-86) 149/86 (11/30 0835) SpO2:  [93 %-99 %] 95 % (11/30 0835)     Height: 6' (182.9 cm) Weight: 94.3 kg BMI (Calculated): 28.2   Intake/Output this shift:   Intake/Output Summary (Last 24 hours) at 05/28/2021 1018 Last data filed at 05/28/2021 0900 Gross per 24 hour  Intake 3074.5 ml  Output 1865 ml  Net 1209.5 ml    Constitutional :  alert, cooperative, appears stated age, and no distress  Respiratory:  clear to auscultation bilaterally  Cardiovascular:  regular rate and rhythm  Gastrointestinal: Soft, tender to incision sites as expected.  JP with serosanguinours drainage . Minimal suprapubic and LLQ TTP  Skin: Cool and moist. Staples c/d/i  Psychiatric: Normal affect, non-agitated, not confused       LABS:  CMP Latest Ref Rng & Units 05/28/2021 05/27/2021 05/26/2021  Glucose 70 - 99 mg/dL 81 136(H) -  BUN 6 - 20 mg/dL 25(H) 19 -  Creatinine 0.61 - 1.24 mg/dL 1.64(H) 1.60(H) 1.64(H)  Sodium 135 - 145 mmol/L 135 135 -  Potassium 3.5 - 5.1  mmol/L 4.0 4.6 -  Chloride 98 - 111 mmol/L 107 105 -  CO2 22 - 32 mmol/L 23 25 -  Calcium 8.9 - 10.3 mg/dL 8.2(L) 8.3(L) -  Total Protein 6.0 - 8.5 g/dL - - -  Total Bilirubin 0.0 - 1.2 mg/dL - - -  Alkaline Phos 44 - 121 IU/L - - -  AST 0 - 40 IU/L - - -  ALT 0 - 44 IU/L - - -   CBC Latest Ref Rng & Units 05/28/2021 05/27/2021 05/26/2021  WBC 4.0 - 10.5 K/uL 9.4 13.5(H) 12.8(H)  Hemoglobin 13.0 - 17.0 g/dL 12.4(L) 13.6 14.6  Hematocrit 39.0 - 52.0 % 36.6(L) 39.1 42.4  Platelets 150 - 400 K/uL 147(L) 175 171    RADS: N/a Assessment:   S/p robotic assisted lap sigmoidectomy for chronic diverticulitis.  Encourage ambulation, wait for ROBF.  Continue clears for now.  Hgb drop likely dilutional. Repeat labs in am. Cr level back to baseline, so will discontinue fluids.

## 2021-05-28 NOTE — Anesthesia Postprocedure Evaluation (Signed)
Anesthesia Post Note  Patient: Randy Dean  Procedure(s) Performed: XI ROBOT ASSISTED SIGMOID COLECTOMY (Abdomen) INDOCYANINE GREEN FLUORESCENCE IMAGING (ICG)  Patient location during evaluation: PACU Anesthesia Type: General Level of consciousness: awake and awake and alert Vital Signs Assessment: post-procedure vital signs reviewed and stable Respiratory status: nonlabored ventilation Cardiovascular status: blood pressure returned to baseline Anesthetic complications: no   No notable events documented.   Last Vitals:  Vitals:   05/28/21 0259 05/28/21 0500  BP: 124/80 137/85  Pulse: 84 80  Resp: 16 18  Temp: 36.7 C 36.8 C  SpO2: 93% 97%    Last Pain:  Vitals:   05/28/21 0533  TempSrc:   PainSc: 0-No pain                 VAN STAVEREN,Arieonna Medine

## 2021-05-28 NOTE — Progress Notes (Signed)
PHARMACIST - PHYSICIAN COMMUNICATION  CONCERNING: IV to Oral Route Change Policy  RECOMMENDATION: This patient is receiving pantoprazole by the intravenous route.  Based on criteria approved by the Pharmacy and Therapeutics Committee, the intravenous medication(s) is/are being converted to the equivalent oral dose form(s).   DESCRIPTION: These criteria include: The patient is eating (either orally or via tube) and/or has been taking other orally administered medications for a least 24 hours The patient has no evidence of active gastrointestinal bleeding or impaired GI absorption (gastrectomy, short bowel, patient on TNA or NPO).  If you have questions about this conversion, please contact the [place], [facility] 05/28/2021 7:35 AM

## 2021-05-28 NOTE — Plan of Care (Signed)
  Problem: Education: Goal: Required Educational Video(s) Outcome: Progressing   Problem: Clinical Measurements: Goal: Ability to maintain clinical measurements within normal limits will improve Outcome: Progressing Goal: Postoperative complications will be avoided or minimized Outcome: Progressing   Problem: Skin Integrity: Goal: Demonstration of wound healing without infection will improve Outcome: Progressing   

## 2021-05-29 LAB — BASIC METABOLIC PANEL
Anion gap: 8 (ref 5–15)
BUN: 22 mg/dL — ABNORMAL HIGH (ref 6–20)
CO2: 23 mmol/L (ref 22–32)
Calcium: 8.6 mg/dL — ABNORMAL LOW (ref 8.9–10.3)
Chloride: 105 mmol/L (ref 98–111)
Creatinine, Ser: 1.71 mg/dL — ABNORMAL HIGH (ref 0.61–1.24)
GFR, Estimated: 46 mL/min — ABNORMAL LOW (ref >60)
Glucose, Bld: 74 mg/dL (ref 70–99)
Potassium: 4 mmol/L (ref 3.5–5.1)
Sodium: 136 mmol/L (ref 135–145)

## 2021-05-29 LAB — CBC WITH DIFFERENTIAL/PLATELET
Abs Immature Granulocytes: 0.04 10*3/uL (ref 0.00–0.07)
Basophils Absolute: 0 10*3/uL (ref 0.0–0.1)
Basophils Relative: 0 %
Eosinophils Absolute: 0.1 10*3/uL (ref 0.0–0.5)
Eosinophils Relative: 1 %
HCT: 38.1 % — ABNORMAL LOW (ref 39.0–52.0)
Hemoglobin: 13.2 g/dL (ref 13.0–17.0)
Immature Granulocytes: 1 %
Lymphocytes Relative: 9 %
Lymphs Abs: 0.7 10*3/uL (ref 0.7–4.0)
MCH: 31.7 pg (ref 26.0–34.0)
MCHC: 34.6 g/dL (ref 30.0–36.0)
MCV: 91.6 fL (ref 80.0–100.0)
Monocytes Absolute: 0.6 10*3/uL (ref 0.1–1.0)
Monocytes Relative: 7 %
Neutro Abs: 6.6 10*3/uL (ref 1.7–7.7)
Neutrophils Relative %: 82 %
Platelets: 164 10*3/uL (ref 150–400)
RBC: 4.16 MIL/uL — ABNORMAL LOW (ref 4.22–5.81)
RDW: 12.2 % (ref 11.5–15.5)
WBC: 8 10*3/uL (ref 4.0–10.5)
nRBC: 0 % (ref 0.0–0.2)

## 2021-05-29 MED ORDER — HYDROCODONE-ACETAMINOPHEN 5-325 MG PO TABS
1.0000 | ORAL_TABLET | Freq: Four times a day (QID) | ORAL | 0 refills | Status: DC | PRN
Start: 2021-05-29 — End: 2021-09-17

## 2021-05-29 MED ORDER — ACETAMINOPHEN 325 MG PO TABS: 650.0000 mg | ORAL_TABLET | Freq: Three times a day (TID) | ORAL | 0 refills | Status: AC | PRN

## 2021-05-29 MED ORDER — BUSPIRONE HCL 15 MG PO TABS: 15.0000 mg | ORAL_TABLET | Freq: Every evening | ORAL | Status: AC | PRN

## 2021-05-29 MED ORDER — NURTEC 75 MG PO TBDP: 75.0000 mg | ORAL_TABLET | Freq: Every day | ORAL | Status: AC | PRN

## 2021-05-29 MED ORDER — DOCUSATE SODIUM 100 MG PO CAPS: 100.0000 mg | ORAL_CAPSULE | Freq: Two times a day (BID) | ORAL | 0 refills | Status: AC | PRN

## 2021-05-29 MED ORDER — NYSTATIN 100000 UNIT/ML MT SUSP: 5.0000 mL | Freq: Every day | OROMUCOSAL | Status: DC | PRN

## 2021-05-29 NOTE — Discharge Instructions (Signed)
Laparoscopic Colectomy, Care After This sheet gives you information about how to care for yourself after your procedure. Your health care provider may also give you more specific instructions. If you have problems or questions, contact your health care provider. What can I expect after the procedure? After your procedure, it is common to have the following: Pain in your abdomen, especially in the incision areas. You will be given medicine to control the pain. Tiredness. This is a normal part of the recovery process. Your energy level will return to normal over the next several weeks. Changes in your bowel movements, such as constipation or needing to go more often. Talk with your health care provider about how to manage this. Follow these instructions at home: Medicines  tylenol and advil as needed for discomfort.  Please alternate between the two every four hours as needed for pain.    Use narcotics, if prescribed, only when tylenol and motrin is not enough to control pain.  325-650mg every 8hrs to max of 4000mg/24hrs (including the 325mg in every norco dose) for the tylenol.    Advil up to 800mg per dose every 8hrs as needed for pain.   Do not drive or use heavy machinery while taking prescription pain medicine. Do not drink alcohol while taking prescription pain medicine. If you were prescribed an antibiotic medicine, use it as told by your health care provider. Do not stop using the antibiotic even if you start to feel better. Incision care    Follow instructions from your health care provider about how to take care of your incision areas. Make sure you: Keep your incisions clean and dry. Wash your hands with soap and water before and after applying medicine to the areas, and before and after changing your bandage (dressing). If soap and water are not available, use hand sanitizer. Change your dressing as told by your health care provider. Leave stitches (sutures), skin glue, or adhesive  strips in place. These skin closures may need to stay in place for 2 weeks or longer. If adhesive strip edges start to loosen and curl up, you may trim the loose edges. Do not remove adhesive strips completely unless your health care provider tells you to do that. Do not wear tight clothing over the incisions. Tight clothing may rub and irritate the incision areas, which may cause the incisions to open. Do not take baths, swim, or use a hot tub until your health care provider approves. OK TO SHOWER.   Check your incision area every day for signs of infection. Check for: More redness, swelling, or pain. More fluid or blood. Warmth. Pus or a bad smell. Activity Avoid lifting anything that is heavier than 10 lb (4.5 kg) for 2 weeks or until your health care provider says it is okay. You may resume normal activities as told by your health care provider. Ask your health care provider what activities are safe for you. Take rest breaks during the day as needed. Eating and drinking Follow instructions from your health care provider about what you can eat after surgery. To prevent or treat constipation while you are taking prescription pain medicine, your health care provider may recommend that you: Drink enough fluid to keep your urine clear or pale yellow. Take over-the-counter or prescription medicines. Eat foods that are high in fiber, such as fresh fruits and vegetables, whole grains, and beans. Limit foods that are high in fat and processed sugars, such as fried and sweet foods. General instructions Ask your   health care provider when you will need an appointment to get your sutures or staples removed. Keep all follow-up visits as told by your health care provider. This is important. Contact a health care provider if: You have more redness, swelling, or pain around your incisions. You have more fluid or blood coming from the incisions. Your incisions feel warm to the touch. You have pus or a  bad smell coming from your incisions or your dressing. You have a fever. You have an incision that breaks open (edges not staying together) after sutures or staples have been removed. Get help right away if: You develop a rash. You have chest pain or difficulty breathing. You have pain or swelling in your legs. You feel light-headed or you faint. Your abdomen swells (becomes distended). You have nausea or vomiting. You have blood in your stool (feces). This information is not intended to replace advice given to you by your health care provider. Make sure you discuss any questions you have with your health care provider. Document Released: 01/02/2005 Document Revised: 03/04/2018 Document Reviewed: 03/16/2016 Elsevier Interactive Patient Education  2019 [employer]    

## 2021-05-30 NOTE — Discharge Summary (Signed)
Physician Discharge Summary  Patient ID: Randy Dean MRN: [id number] DOB/AGE: [birth year] 55 y.o.  Admit date: 05/26/2021 Discharge date: 05/29/2021  Admission Diagnoses: diverticulitis, chronic  Discharge Diagnoses:  Same as above  Discharged Condition: good  Hospital Course: admitted for above. Underwent surgery.  Please see op note for details.  Post op, recovered as expected.  At time of d/c, tolerating diet and pain controlled  Consults: None  Discharge Exam: Blood pressure (!) 149/79, pulse 74, temperature 98.4 F (36.9 C), temperature source Oral, resp. rate 18, height 6' (1.829 m), weight 94.3 kg, SpO2 94 %. General appearance: alert, cooperative, and no distress GI: soft, expected TTP around incisions and LLQ. Drain remains serosanguinous. Staples C/D/I.  Disposition:  Discharge disposition: 01-Home or Self Care       Discharge Instructions     Discharge patient   Complete by: As directed    Discharge disposition: 01-Home or Self Care   Discharge patient date: 05/29/2021      Allergies as of 05/29/2021       Reactions   Serzone [nefazodone] Swelling   Tongue and facial swelling   Codeine Nausea Only   Dizziness, sweating   Doxycycline Other (See Comments)   Severe headaches   Effexor [venlafaxine]    "Feels out of it"   Montelukast Sodium    Causes headaches   Other    Can not have dye in IV, due to low kidney function   Paxil [paroxetine Hcl]    "does not feel right in the head"   T-pa [alteplase] Swelling   Fascial flushing and tongue swelling. At [facility]   Esomeprazole Sodium Rash        Medication List     STOP taking these medications    tamsulosin 0.4 MG Caps capsule Commonly known as: FLOMAX   traMADol 50 MG tablet Commonly known as: ULTRAM       TAKE these medications    acetaminophen 650 MG CR tablet Commonly known as: TYLENOL Take 1,300 mg by mouth every 8 (eight) hours as needed for pain. What changed: Another  medication with the same name was added. Make sure you understand how and when to take each.   acetaminophen 325 MG tablet Commonly known as: Tylenol Take 2 tablets (650 mg total) by mouth every 8 (eight) hours as needed for mild pain. What changed: You were already taking a medication with the same name, and this prescription was added. Make sure you understand how and when to take each.   albuterol 108 (90 Base) MCG/ACT inhaler Commonly known as: VENTOLIN HFA Inhale 2 puffs into the lungs every 6 (six) hours as needed for wheezing or shortness of breath.   aspirin EC 81 MG tablet Take 81 mg by mouth daily.   busPIRone 15 MG tablet Commonly known as: BUSPAR Take 1 tablet (15 mg total) by mouth at bedtime as needed (anxiety).   dexlansoprazole 60 MG capsule Commonly known as: Dexilant Take 1 capsule (60 mg total) by mouth daily. What changed:  how much to take how to take this when to take this   docusate sodium 100 MG capsule Commonly known as: Colace Take 1 capsule (100 mg total) by mouth 2 (two) times daily as needed for up to 10 days for mild constipation.   famotidine 40 MG tablet Commonly known as: PEPCID Take 1 tablet (40 mg total) by mouth daily. What changed: when to take this   fexofenadine 180 MG tablet Commonly known as: ALLEGRA  Take 180 mg by mouth every morning.   fluticasone 110 MCG/ACT inhaler Commonly known as: FLOVENT HFA Inhale 1 puff into the lungs 2 (two) times daily.   fluticasone 50 MCG/ACT nasal spray Commonly known as: FLONASE Place 2 sprays into both nostrils 2 (two) times daily.   hydrochlorothiazide 25 MG tablet Commonly known as: HYDRODIURIL Take 25 mg by mouth as needed.   HYDROcodone-acetaminophen 5-325 MG tablet Commonly known as: Norco Take 1 tablet by mouth every 6 (six) hours as needed for up to 15 doses for moderate pain.   multivitamin with minerals Tabs tablet Take 1 tablet by mouth daily.   Nurtec 75 MG Tbdp Generic  drug: Rimegepant Sulfate Take 75 mg by mouth daily as needed (migraine).   nystatin 100000 UNIT/ML suspension Commonly known as: MYCOSTATIN Take 5 mLs (500,000 Units total) by mouth daily as needed (thrush).   phenylephrine 10 MG Tabs tablet Commonly known as: SUDAFED PE Take 10 mg by mouth 2 (two) times daily with a meal.        Follow-up Information     Rio Taber, DO. Go on 06/09/2021.   Specialty: Surgery Why: post op drain, possible staple removal8:30am appointment Contact information: [address] [place] [address] [phone]                  Total time spent arranging discharge was >56min. Signed: Benjamine Sprague 05/30/2021, 8:12 AM

## 2021-06-01 ENCOUNTER — Other Ambulatory Visit: Payer: Self-pay

## 2021-06-01 ENCOUNTER — Emergency Department
Admission: EM | Admit: 2021-06-01 | Discharge: 2021-06-01 | Disposition: A | Discharge status: Home / Self Care | Payer: Medicare HMO | Attending: Emergency Medicine | Admitting: Emergency Medicine

## 2021-06-01 ENCOUNTER — Emergency Department: Payer: Medicare HMO

## 2021-06-01 ENCOUNTER — Encounter: Payer: Self-pay | Admitting: Emergency Medicine

## 2021-06-01 DIAGNOSIS — G8918 Other acute postprocedural pain: Secondary | ICD-10-CM | POA: Diagnosis not present

## 2021-06-01 DIAGNOSIS — R109 Unspecified abdominal pain: Secondary | ICD-10-CM | POA: Diagnosis not present

## 2021-06-01 DIAGNOSIS — R1084 Generalized abdominal pain: Secondary | ICD-10-CM | POA: Diagnosis not present

## 2021-06-01 DIAGNOSIS — J45909 Unspecified asthma, uncomplicated: Secondary | ICD-10-CM | POA: Insufficient documentation

## 2021-06-01 DIAGNOSIS — N1832 Chronic kidney disease, stage 3b: Secondary | ICD-10-CM | POA: Insufficient documentation

## 2021-06-01 DIAGNOSIS — Z7982 Long term (current) use of aspirin: Secondary | ICD-10-CM | POA: Insufficient documentation

## 2021-06-01 DIAGNOSIS — Z87891 Personal history of nicotine dependence: Secondary | ICD-10-CM | POA: Insufficient documentation

## 2021-06-01 DIAGNOSIS — Z7951 Long term (current) use of inhaled steroids: Secondary | ICD-10-CM | POA: Insufficient documentation

## 2021-06-01 LAB — URINALYSIS, ROUTINE W REFLEX MICROSCOPIC
Bilirubin Urine: NEGATIVE
Glucose, UA: NEGATIVE mg/dL
Hgb urine dipstick: NEGATIVE
Ketones, ur: NEGATIVE mg/dL
Leukocytes,Ua: NEGATIVE
Nitrite: NEGATIVE
Protein, ur: 30 mg/dL — AB
Specific Gravity, Urine: 1.02 (ref 1.005–1.030)
pH: 7 (ref 5.0–8.0)

## 2021-06-01 LAB — COMPREHENSIVE METABOLIC PANEL
ALT: 29 U/L (ref 0–44)
AST: 29 U/L (ref 15–41)
Albumin: 3.3 g/dL — ABNORMAL LOW (ref 3.5–5.0)
Alkaline Phosphatase: 58 U/L (ref 38–126)
Anion gap: 8 (ref 5–15)
BUN: 14 mg/dL (ref 6–20)
CO2: 26 mmol/L (ref 22–32)
Calcium: 8.8 mg/dL — ABNORMAL LOW (ref 8.9–10.3)
Chloride: 100 mmol/L (ref 98–111)
Creatinine, Ser: 1.3 mg/dL — ABNORMAL HIGH (ref 0.61–1.24)
GFR, Estimated: >60 mL/min (ref >60)
Glucose, Bld: 102 mg/dL — ABNORMAL HIGH (ref 70–99)
Potassium: 4.4 mmol/L (ref 3.5–5.1)
Sodium: 134 mmol/L — ABNORMAL LOW (ref 135–145)
Total Bilirubin: 1 mg/dL (ref 0.3–1.2)
Total Protein: 6.6 g/dL (ref 6.5–8.1)

## 2021-06-01 LAB — CBC
HCT: 40 % (ref 39.0–52.0)
Hemoglobin: 13.7 g/dL (ref 13.0–17.0)
MCH: 30.7 pg (ref 26.0–34.0)
MCHC: 34.3 g/dL (ref 30.0–36.0)
MCV: 89.7 fL (ref 80.0–100.0)
Platelets: 229 10*3/uL (ref 150–400)
RBC: 4.46 MIL/uL (ref 4.22–5.81)
RDW: 12 % (ref 11.5–15.5)
WBC: 9.6 10*3/uL (ref 4.0–10.5)
nRBC: 0 % (ref 0.0–0.2)

## 2021-06-01 LAB — URINALYSIS, MICROSCOPIC (REFLEX)
Bacteria, UA: NONE SEEN
Squamous Epithelial / HPF: NONE SEEN (ref 0–5)

## 2021-06-01 LAB — LIPASE, BLOOD: Lipase: 44 U/L (ref 11–51)

## 2021-06-01 MED ORDER — POLYETHYLENE GLYCOL 3350 17 GM/SCOOP PO POWD: 17.0000 g | Freq: Every day | ORAL | 0 refills | Status: DC | PRN

## 2021-06-01 MED ORDER — HYDROMORPHONE HCL 1 MG/ML IJ SOLN
1.0000 mg | Freq: Once | INTRAMUSCULAR | Status: AC
  Administered 2021-06-01: 13:00:00 1 mg via INTRAVENOUS
  Filled 2021-06-01: qty 1

## 2021-06-01 MED ORDER — ONDANSETRON HCL 4 MG/2ML IJ SOLN
4.0000 mg | Freq: Once | INTRAMUSCULAR | Status: AC
  Administered 2021-06-01: 11:00:00 4 mg via INTRAVENOUS
  Filled 2021-06-01: qty 2

## 2021-06-01 MED ORDER — LORAZEPAM 2 MG/ML IJ SOLN
1.0000 mg | Freq: Once | INTRAMUSCULAR | Status: AC
  Administered 2021-06-01: 15:00:00 1 mg via INTRAVENOUS
  Filled 2021-06-01: qty 1

## 2021-06-01 MED ORDER — PREGABALIN 50 MG PO CAPS: 50.0000 mg | ORAL_CAPSULE | Freq: Three times a day (TID) | ORAL | 0 refills | Status: DC

## 2021-06-01 MED ORDER — HYDROMORPHONE HCL 1 MG/ML IJ SOLN
1.0000 mg | Freq: Once | INTRAMUSCULAR | Status: AC
  Administered 2021-06-01: 11:00:00 1 mg via INTRAVENOUS
  Filled 2021-06-01: qty 1

## 2021-06-01 MED ORDER — IOHEXOL 300 MG/ML  SOLN
100.0000 mL | Freq: Once | INTRAMUSCULAR | Status: AC | PRN
  Administered 2021-06-01: 12:00:00 100 mL via INTRAVENOUS

## 2021-06-01 MED ORDER — SODIUM CHLORIDE 0.9 % IV BOLUS
1000.0000 mL | Freq: Once | INTRAVENOUS | Status: AC
  Administered 2021-06-01: 11:00:00 1000 mL via INTRAVENOUS

## 2021-06-01 NOTE — ED Notes (Signed)
Pt waiting for wife to come back to pick pt up. Pt not lobby appropriate.

## 2021-06-01 NOTE — Progress Notes (Signed)
Patient ID: Randy Dean, male   DOB: 1966/02/12, 55 y.o.   MRN: [id number]  Surgery Note  Patient came to the emergency room due to generalized abdominal pain on the ED he was complaining of severe abdominal pain out of 10 and he was very uncomfortable.  He was doing well after discharge until last night that he started having burning upon eating and moving.  As per wife she also noticed that this change was dramatic from 1 day to the other.  He has some nausea but no vomiting.  He has not passed gas or stool in the last couple of days.  They endorses having fever at home.  Normal temp.  In the ED.  Vitals:   06/01/21 1500 06/01/21 1530  BP: 125/77 (!) 141/83  Pulse: 70 73  Resp: 15 13  Temp:    SpO2: 94% 100%   General: The patient was alert but under the effect of Ativan Abdomen: Soft and depressible, nondistended.  Wounds are in clean  CBC    Component Value Date/Time   WBC 9.6 06/01/2021 0958   RBC 4.46 06/01/2021 0958   HGB 13.7 06/01/2021 0958   HGB 14.9 07/04/2020 1534   HCT 40.0 06/01/2021 0958   HCT 42.8 07/04/2020 1534   PLT 229 06/01/2021 0958   PLT 195 07/04/2020 1534   MCV 89.7 06/01/2021 0958   MCV 91 07/04/2020 1534   MCH 30.7 06/01/2021 0958   MCHC 34.3 06/01/2021 0958   RDW 12.0 06/01/2021 0958   RDW 11.8 07/04/2020 1534   LYMPHSABS 0.7 05/29/2021 0638   LYMPHSABS 1.5 07/04/2020 1534   MONOABS 0.6 05/29/2021 0638   EOSABS 0.1 05/29/2021 0638   EOSABS 0.1 07/04/2020 1534   BASOSABS 0.0 05/29/2021 0638   BASOSABS 0.0 07/04/2020 1534   CMP     Component Value Date/Time   NA 134 (L) 06/01/2021 0958   NA 139 04/30/2020 0907   K 4.4 06/01/2021 0958   CL 100 06/01/2021 0958   CO2 26 06/01/2021 0958   GLUCOSE 102 (H) 06/01/2021 0958   BUN 14 06/01/2021 0958   BUN 15 04/30/2020 0907   CREATININE 1.30 (H) 06/01/2021 0958   CALCIUM 8.8 (L) 06/01/2021 0958   PROT 6.6 06/01/2021 0958   PROT 5.9 (L) 04/30/2020 0907   ALBUMIN 3.3 (L) 06/01/2021 0958    ALBUMIN 3.9 04/30/2020 0907   AST 29 06/01/2021 0958   ALT 29 06/01/2021 0958   ALKPHOS 58 06/01/2021 0958   BILITOT 1.0 06/01/2021 0958   BILITOT 0.4 04/30/2020 0907   GFRNONAA >60 06/01/2021 0958   GFRAA 52 (L) 04/30/2020 0907     A/P:  Patient with recent robotic assisted laparoscopic sigmoid colectomy with anastomosis.  The patient was experiencing severe pain at home and he came to the ED for evaluation.  At the ED he was found with normal white blood cell count, electrolytes without any significant disturbance, renal function at baseline.  He is vital signs at the ED without tachycardia and no fever. CT scan of the abdomen and pelvis shows expected post operative changes without fluid collection, no abscess, no free air, no obstruction.   I discussed this finding with the wife and reassured that there is no sign of complication. We reviewed his outpatient medications for pain management. He will take acetaminophen for mild pain and hydrocodone for breakthrough pain. I will add pregabalin for multimodality pain management. Patient not able to get NSAIDS due to kidney condition. I also  recommended heating pads and icepacks. Differential diagnosis is constipation, gas pain, musculoskeletal pain and/or anxiety. Wife and patient feeling comfortable to go home.   Herbert Pun, MD

## 2021-06-01 NOTE — ED Provider Notes (Signed)
[facility] Emergency Department Provider Note  Time seen: 10:26 AM  I have reviewed the triage vital signs and the nursing notes.   HISTORY  Chief Complaint Abdominal Pain and Post-op Problem   HPI Randy Dean is a 55 y.o. male with a past medical history of anxiety, asthma, gastric reflux, CVA, recent partial colectomy for colitis with JP drain in place presents to the emergency department for abdominal pain.  According to the patient he recently suffered from colitis requiring a partial colectomy states he was discharged from the hospital 3 days ago.  States he was doing better upon discharge but over the past 24 hours his abdominal pain has returned worsened as well as fever as high as 101 at home.  Patient states he has had minimal flatus and no stool.  States nausea but no vomiting.  Feels like his abdomen is distended.   Past Medical History:  Diagnosis Date   Abnormality of gait 10/30/2015   Allergic rhinitis    Anxiety    Arthritis    Asthma    well controlled   BPH (benign prostatic hyperplasia)    Chronic headaches    partially sinus/partially brain injury (per pt)   Claustrophobia    Complication of anesthesia    hard to wake up after 1 surgery but pt had had 3surgeries close together   Dyspnea    GERD (gastroesophageal reflux disease)    Head trauma 2010   has caused anxiety, memory issues and neck problems   Headache 10/30/2015   Right temporal    Ischemic stroke ([place]) 06/2019   Kidney damage    due to years of taking meloxicam after head injury   Neck stiffness    metal plate.  mild limitations of side to side and up and dowm movement   Prediabetes    PTSD (post-traumatic stress disorder)    RAD (reactive airway disease)    Spinal cord compression (HCC)    -cervical from brain trauma   Stage 3b chronic kidney disease ([place])    Stroke ([place])    TIA x3- some residual right side weakness   Vertigo    Weakness of right side of  body    from TIAs    Patient Active Problem List   Diagnosis Date Noted   Diverticulitis 05/26/2021   History of TIA (transient ischemic attack) 08/11/2019   Diverticulitis large intestine 04/11/2018   Prediabetes 10/21/2016   Abnormality of gait 10/30/2015   Headache 10/30/2015   Anxiety and depression 11/23/2014   Brain injuries 11/23/2014   Clinical depression 11/23/2014   HLD (hyperlipidemia) 11/23/2014   Insomnia due to medical condition 11/23/2014   Injury of face and neck 11/23/2014   Cervical pain 11/23/2014   Loss of feeling or sensation 11/23/2014   Tingling 11/23/2014   Adiposity 11/23/2014   Allergic rhinitis, seasonal 11/23/2014   Neurosis, posttraumatic 11/23/2014   Spinal stenosis in cervical region 11/23/2014   Injury brain, traumatic 11/23/2014   Has a tremor 11/23/2014   Disease of vein 11/23/2014   Head revolving around 11/23/2014   Shortness of breath 03/14/2013   Anxiety 10/11/2008   Intervertebral cervical disc disorder with myelopathy, cervical region 10/11/2008   Musculoskeletal disorder and symptoms referable to neck 10/11/2008   Acid reflux 10/11/2008    Past Surgical History:  Procedure Laterality Date   COLONOSCOPY WITH PROPOFOL N/A 03/02/2019   Procedure: COLONOSCOPY WITH PROPOFOL;  Surgeon: Benjamine Sprague, DO;  Location: [facility] ENDOSCOPY;  Service: General;  Laterality: N/A;   DIAGNOSTIC LAPAROSCOPY     ENDOSCOPIC TURBINATE REDUCTION Bilateral 08/22/2015   Procedure: ENDOSCOPIC TURBINATE REDUCTION;  Surgeon: Margaretha Sheffield, MD;  Location: [place];  Service: ENT;  Laterality: Bilateral;   EYE MUSCLE SURGERY Left    Due to conential left amblyopia x2   FINGER FRACTURE SURGERY Right    index finger   NASAL SINUS SURGERY  02/28/2015   NECK SURGERY     x3-metal plate in neck   SHOULDER SURGERY      Prior to Admission medications   Medication Sig Start Date End Date Taking? Authorizing Provider  acetaminophen (TYLENOL) 325 MG tablet  Take 2 tablets (650 mg total) by mouth every 8 (eight) hours as needed for mild pain. 05/29/21 06/28/21  Benjamine Sprague, DO  acetaminophen (TYLENOL) 650 MG CR tablet Take 1,300 mg by mouth every 8 (eight) hours as needed for pain.    [provider]  albuterol (VENTOLIN HFA) 108 (90 Base) MCG/ACT inhaler Inhale 2 puffs into the lungs every 6 (six) hours as needed for wheezing or shortness of breath. 09/16/20   Lavonia Drafts, MD  aspirin EC 81 MG tablet Take 81 mg by mouth daily.    [provider]  busPIRone (BUSPAR) 15 MG tablet Take 1 tablet (15 mg total) by mouth at bedtime as needed (anxiety). 05/29/21   Lysle Pearl, Isami, DO  dexlansoprazole (DEXILANT) 60 MG capsule Take 1 capsule (60 mg total) by mouth daily. Patient taking differently: Take 60 mg by mouth every morning. Take 1 capsule (60 mg total) by mouth daily. 07/25/20   Jerrol Banana., MD  docusate sodium (COLACE) 100 MG capsule Take 1 capsule (100 mg total) by mouth 2 (two) times daily as needed for up to 10 days for mild constipation. 05/29/21 06/08/21  Lysle Pearl, Isami, DO  famotidine (PEPCID) 40 MG tablet Take 1 tablet (40 mg total) by mouth daily. Patient taking differently: Take 40 mg by mouth at bedtime. 07/25/20   Jerrol Banana., MD  fexofenadine (ALLEGRA) 180 MG tablet Take 180 mg by mouth every morning.    [provider]  fluticasone (FLONASE) 50 MCG/ACT nasal spray Place 2 sprays into both nostrils 2 (two) times daily. 07/25/20   Jerrol Banana., MD  fluticasone (FLOVENT HFA) 110 MCG/ACT inhaler Inhale 1 puff into the lungs 2 (two) times daily.    [provider]  hydrochlorothiazide (HYDRODIURIL) 25 MG tablet Take 25 mg by mouth as needed. 10/23/20 10/23/21  [provider]  HYDROcodone-acetaminophen (NORCO) 5-325 MG tablet Take 1 tablet by mouth every 6 (six) hours as needed for up to 15 doses for moderate pain. 05/29/21   Benjamine Sprague, DO  Multiple Vitamin (MULTIVITAMIN WITH  MINERALS) TABS tablet Take 1 tablet by mouth daily.    [provider]  nystatin (MYCOSTATIN) 100000 UNIT/ML suspension Take 5 mLs (500,000 Units total) by mouth daily as needed (thrush). 05/29/21   Lysle Pearl, Isami, DO  phenylephrine (SUDAFED PE) 10 MG TABS tablet Take 10 mg by mouth 2 (two) times daily with a meal.    [provider]  Rimegepant Sulfate (NURTEC) 75 MG TBDP Take 75 mg by mouth daily as needed (migraine). 05/29/21   Benjamine Sprague, DO    Allergies  Allergen Reactions   Serzone [Nefazodone] Swelling    Tongue and facial swelling   Codeine Nausea Only    Dizziness, sweating   Doxycycline Other (See Comments)    Severe  headaches   Effexor [Venlafaxine]     "Feels out of it"   Montelukast Sodium     Causes headaches   Other     Can not have dye in IV, due to low kidney function   Paxil [Paroxetine Hcl]     "does not feel right in the head"   T-Pa [Alteplase] Swelling    Fascial flushing and tongue swelling. At [facility]   Esomeprazole Sodium Rash    Family History  Problem Relation Age of Onset   Fibromyalgia Mother    Heart disease Father    Tremor Father    Diabetes Maternal Grandmother    Hypertension Maternal Grandmother    Hypertension Maternal Grandfather    Diabetes Paternal Grandmother    Hypertension Paternal Grandmother    Hypertension Paternal Grandfather     Social History Social History   Tobacco Use   Smoking status: Former    Packs/day: 0.50    Years: 3.00    Pack years: 1.50    Types: Cigarettes    Quit date: 06/28/1984    Years since quitting: 36.9   Smokeless tobacco: Current    Types: Snuff    Last attempt to quit: 06/29/1986   Tobacco comments:    1 can dip/day  Vaping Use   Vaping Use: Never used  Substance Use Topics   Alcohol use: No    Alcohol/week: 0.0 standard drinks   Drug use: No    Review of Systems Constitutional: Low-grade fever at home per patient Cardiovascular: Negative for chest pain. Respiratory:  Negative for shortness of breath. Gastrointestinal: 10/10 diffuse abdominal pain with abdominal distention per patient.  Positive for nausea but no vomiting.  Minimal flatus, no stools. Genitourinary: Negative for urinary compaints Musculoskeletal: Negative for musculoskeletal complaints Neurological: Negative for headache All other ROS negative  ____________________________________________   PHYSICAL EXAM:  VITAL SIGNS: ED Triage Vitals  Enc Vitals Group     BP 06/01/21 0955 (!) 148/84     Pulse Rate 06/01/21 0955 76     Resp 06/01/21 0955 (!) 22     Temp 06/01/21 0955 98 F (36.7 C)     Temp Source 06/01/21 0955 Oral     SpO2 06/01/21 0955 96 %     Weight 06/01/21 0956 208 lb (94.3 kg)     Height 06/01/21 0956 6' (1.829 m)     Head Circumference --      Peak Flow --      Pain Score 06/01/21 0955 8     Pain Loc --      Pain Edu? --      Excl. in [place]? --    Constitutional: Alert and oriented.  Moderate distress due to abdominal pain. Eyes: Normal exam ENT      Head: Normocephalic and atraumatic.      Mouth/Throat: Mucous membranes are moist. Cardiovascular: Normal rate, regular rhythm.  Respiratory: Normal respiratory effort without tachypnea nor retractions. Breath sounds are clear  Gastrointestinal: Soft, moderate tenderness to palpation diffusely, well-appearing incisions.  JP drain draining serosanguineous fluid.  Moderate distention. Musculoskeletal: Nontender with normal range of motion in all extremities.  Neurologic:  Normal speech and language. No gross focal neurologic deficits Skin:  Skin is warm, dry and intact.  Psychiatric: Mood and affect are normal.  ____________________________________________     RADIOLOGY  1. Expected surgical changes of a recent partial colectomy at the  level of the sigmoid colon. No obstruction or fluid collection at  this surgical  site. No evidence of a bowel perforation.  2. JP-drain in place with tip positioned in the LEFT  pelvis near the  surgical anastomosis.  3. Additional expected postsurgical subcutaneous emphysema within  the anterior abdominal wall and anterior pelvic wall extending to  the bilateral scrotum. No fluid collection or evidence of surgical  complicating feature within these superficial soft tissues.  4. Moderate amount of stool and gas within the colon  (constipation?). Again, no evidence of a mechanical bowel  obstruction.  5. Remainder of the abdomen and pelvis CT is unremarkable. No  evidence of acute solid organ abnormality. No renal or ureteral  calculi. Appendix is normal.   ____________________________________________   INITIAL IMPRESSION / ASSESSMENT AND PLAN / ED COURSE  Pertinent labs & imaging results that were available during my care of the patient were reviewed by me and considered in my medical decision making (see chart for details).   Patient presents to the emergency department for abdominal pain 3 days after being discharged from hospital 3 had a partial colectomy after diagnosis of colitis.  Per record review patient had a sigmoidectomy for diverticulitis 05/26/2021.  Patient's lab work has resulted largely within normal limits at baseline for the patient including a normal white blood cell count.  We attempted to obtain a CT scan however patient cannot tolerate laying down.  We will continue with pain management to the patient is able to tolerate lying flat.  CT scan feel that the CT scanner is quite important to this patient disposition and diagnosis.  CT does not appear to show any acute or concerning abnormality.  I spoke to Dr. Peyton Najjar of general surgery who is currently evaluating the patient.  I reevaluated the patient he appears much more comfortable.  We will discuss with surgery for disposition but anticipate likely discharge home given his reassuring lab work CT scan and now near resolution of abdominal pain.  WINSON EICHORN was evaluated in Emergency  Department on 06/01/2021 for the symptoms described in the history of present illness. He was evaluated in the context of the global COVID-19 pandemic, which necessitated consideration that the patient might be at risk for infection with the SARS-CoV-2 virus that causes COVID-19. Institutional protocols and algorithms that pertain to the evaluation of patients at risk for COVID-19 are in a state of rapid change based on information released by regulatory bodies including the CDC and federal and state organizations. These policies and algorithms were followed during the patient's care in the ED.  ____________________________________________   FINAL CLINICAL IMPRESSION(S) / ED DIAGNOSES  Abdominal pain   Harvest Dark, MD 06/01/21 1530

## 2021-06-01 NOTE — ED Triage Notes (Signed)
Pt comes into the ED via POV c/o abd pain s/p colectomy.  PT presents with JP drain in place at this time.  Pt states he cant eat or drink and has been unable to sleep.  PT stats his pain medication isn't effective currently.  Pt states his last BM was a day ago. Pt unable to sit due to discomfort at this time.

## 2021-06-01 NOTE — ED Notes (Signed)
Pt was notably anxious during the attempt to scan his abdomen- pt was screaming and thrashing on the table. Ct tech brought pt back to room.

## 2021-06-01 NOTE — ED Notes (Signed)
Pt consistently anxious even after medication, pt nauseus with dry heaves at this time.

## 2021-06-04 ENCOUNTER — Encounter: Payer: Self-pay | Admitting: Surgery

## 2021-06-10 ENCOUNTER — Encounter: Payer: Self-pay | Admitting: Surgery

## 2021-06-10 NOTE — Addendum Note (Signed)
Addendum  created 06/10/21 0827 by Akemi Overholser, Precious Haws, MD   Intraprocedure Event edited, Intraprocedure Staff edited

## 2021-06-17 ENCOUNTER — Other Ambulatory Visit: Payer: Self-pay | Admitting: Surgery

## 2021-06-17 DIAGNOSIS — R1031 Right lower quadrant pain: Secondary | ICD-10-CM

## 2021-06-25 ENCOUNTER — Other Ambulatory Visit: Payer: Self-pay

## 2021-06-25 ENCOUNTER — Ambulatory Visit
Admission: RE | Admit: 2021-06-25 | Discharge: 2021-06-25 | Disposition: A | Discharge status: Home / Self Care | Payer: Medicare HMO | Source: Ambulatory Visit | Attending: Surgery | Admitting: Surgery

## 2021-06-25 DIAGNOSIS — R1031 Right lower quadrant pain: Secondary | ICD-10-CM | POA: Diagnosis not present

## 2021-06-25 DIAGNOSIS — K6389 Other specified diseases of intestine: Secondary | ICD-10-CM | POA: Diagnosis not present

## 2021-06-25 DIAGNOSIS — K529 Noninfective gastroenteritis and colitis, unspecified: Secondary | ICD-10-CM | POA: Diagnosis not present

## 2021-06-25 DIAGNOSIS — K429 Umbilical hernia without obstruction or gangrene: Secondary | ICD-10-CM | POA: Diagnosis not present

## 2021-06-25 DIAGNOSIS — N281 Cyst of kidney, acquired: Secondary | ICD-10-CM | POA: Diagnosis not present

## 2021-09-22 ENCOUNTER — Ambulatory Visit: Payer: Self-pay | Admitting: Surgery

## 2021-09-22 NOTE — H&P (Signed)
Subjective:  ? ?CC: left inguinal hernia ? ?HPI: ? Randy Dean is a 56 y.o. male who returns for evaluation of above cc.   Discomfort in area has worsened the past few days, will like to consider repair now.  First noted during operation for his diverticulitis. ?  ?Past Medical History:  has a past medical history of Abnormality of gait (10/30/2015), Allergic rhinitis, Anxiety, Arthritis, Asthma, BPH (benign prostatic hyperplasia), Chronic headaches, Claustrophobia, Complication of anesthesia, Dyspnea, GERD (gastroesophageal reflux disease), Head trauma (2010), Headache (10/30/2015), Ischemic stroke ([place]) (06/2019), Kidney damage, Neck stiffness, Prediabetes, PTSD (post-traumatic stress disorder), RAD (reactive airway disease), Spinal cord compression ([place]), Stage 3b chronic kidney disease ([place]), Stroke ([place]), Vertigo, and Weakness of right side of body. ? ?Past Surgical History:  ?Past Surgical History:  ?Procedure Laterality Date  ? COLONOSCOPY WITH PROPOFOL N/A 03/02/2019  ? Procedure: COLONOSCOPY WITH PROPOFOL;  Surgeon: Benjamine Sprague, DO;  Location: [facility] ENDOSCOPY;  Service: General;  Laterality: N/A;  ? DIAGNOSTIC LAPAROSCOPY    ? ENDOSCOPIC TURBINATE REDUCTION Bilateral 08/22/2015  ? Procedure: ENDOSCOPIC TURBINATE REDUCTION;  Surgeon: Margaretha Sheffield, MD;  Location: [place];  Service: ENT;  Laterality: Bilateral;  ? EYE MUSCLE SURGERY Left   ? Due to conential left amblyopia x2  ? FINGER FRACTURE SURGERY Right   ? index finger  ? NASAL SINUS SURGERY  02/28/2015  ? NECK SURGERY    ? x3-metal plate in neck  ? SHOULDER SURGERY    ? ? ?Family History: family history includes Diabetes in his maternal grandmother and paternal grandmother; Fibromyalgia in his mother; Heart disease in his father; Hypertension in his maternal grandfather, maternal grandmother, paternal grandfather, and paternal grandmother; Tremor in his father. ? ?Social History:  reports that he quit smoking about 37 years ago. His  smoking use included cigarettes. He has a 1.50 pack-year smoking history. His smokeless tobacco use includes snuff. He reports that he does not drink alcohol and does not use drugs. ? ?Current Medications:  ?Prior to Admission medications   ?Medication Sig Start Date End Date Taking? Authorizing Provider  ?acetaminophen (TYLENOL) 650 MG CR tablet Take 1,300 mg by mouth every 8 (eight) hours as needed for pain.    [provider]  ?albuterol (VENTOLIN HFA) 108 (90 Base) MCG/ACT inhaler Inhale 2 puffs into the lungs every 6 (six) hours as needed for wheezing or shortness of breath. 09/16/20   Lavonia Drafts, MD  ?aspirin EC 81 MG tablet Take 81 mg by mouth daily.    [provider]  ?busPIRone (BUSPAR) 10 MG tablet Take 10 mg by mouth daily.    [provider]  ?busPIRone (BUSPAR) 15 MG tablet Take 1 tablet (15 mg total) by mouth at bedtime as needed (anxiety). ?Patient taking differently: Take 15 mg by mouth at bedtime. 05/29/21   Benjamine Sprague, DO  ?dexlansoprazole (DEXILANT) 60 MG capsule Take 1 capsule (60 mg total) by mouth daily. 07/25/20   Jerrol Banana., MD  ?famotidine (PEPCID) 40 MG tablet Take 1 tablet (40 mg total) by mouth daily. ?Patient taking differently: Take 40 mg by mouth at bedtime. 07/25/20   Jerrol Banana., MD  ?fluticasone Asencion Islam) 50 MCG/ACT nasal spray Place 2 sprays into both nostrils 2 (two) times daily. 07/25/20   Jerrol Banana., MD  ?Fluticasone Propionate, Inhal, (FLOVENT DISKUS) 100 MCG/ACT AEPB Inhale 1 puff into the lungs 2 (two) times daily.    [provider]  ?hydrochlorothiazide (HYDRODIURIL) 25 MG  tablet Take 25 mg by mouth daily as needed (swelling). 10/23/20 10/23/21  [provider]  ?loratadine (CLARITIN) 10 MG tablet Take 10 mg by mouth daily.    [provider]  ?Multiple Vitamin (MULTIVITAMIN WITH MINERALS) TABS tablet Take 1 tablet by mouth daily.    [provider]  ?nystatin (MYCOSTATIN)  100000 UNIT/ML suspension Take 5 mLs (500,000 Units total) by mouth daily as needed (thrush). ?Patient taking differently: Take 5 mLs by mouth 4 (four) times daily. 05/29/21   Benjamine Sprague, DO  ?phenylephrine (SUDAFED PE) 10 MG TABS tablet Take 10 mg by mouth 2 (two) times daily with a meal.    [provider]  ?polyethylene glycol powder (GLYCOLAX/MIRALAX) 17 GM/SCOOP powder Take 17 g by mouth daily as needed for moderate constipation. 06/01/21   Harvest Dark, MD  ?Rimegepant Sulfate (NURTEC) 75 MG TBDP Take 75 mg by mouth daily as needed (migraine). 05/29/21   Benjamine Sprague, DO  ?tamsulosin (FLOMAX) 0.4 MG CAPS capsule Take 0.4 mg by mouth daily after supper.    [provider]  ?traMADol (ULTRAM) 50 MG tablet Take 50 mg by mouth every 8 (eight) hours as needed for moderate pain.    [provider]  ? ? ?Allergies:  ?Allergies as of 09/22/2021 - Review Complete 09/17/2021  ?Allergen Reaction Noted  ? Serzone [nefazodone] Swelling 03/14/2013  ? Ciprofloxacin  08/29/2021  ? Codeine Nausea Only 03/14/2013  ? Doxycycline Other (See Comments) 08/17/2018  ? Effexor [venlafaxine]  07/02/2016  ? Montelukast sodium  05/07/2021  ? Other  05/07/2021  ? Paxil [paroxetine hcl]  07/02/2016  ? T-pa [alteplase] Swelling 07/22/2019  ? Esomeprazole sodium Rash 01/02/2016  ? ? ?ROS:  ?General: Denies weight loss, weight gain, fatigue, fevers, chills, and night sweats. ?Eyes: Denies blurry vision, double vision, eye pain, itchy eyes, and tearing. ?Ears: Denies hearing loss, earache, and ringing in ears. ?Nose: Denies sinus pain, congestion, infections, runny nose, and nosebleeds. ?Mouth/throat: Denies hoarseness, sore throat, bleeding gums, and difficulty swallowing. ?Heart: Denies chest pain, palpitations, racing heart, irregular heartbeat, leg pain or swelling, and decreased activity tolerance. ?Respiratory: Denies breathing difficulty, shortness of breath, wheezing, cough, and sputum. ?GI: Denies  change in appetite, heartburn, nausea, vomiting, constipation, diarrhea, and blood in stool. ?GU: Denies difficulty urinating, pain with urinating, urgency, frequency, blood in urine. ?Musculoskeletal: Denies joint stiffness, pain, swelling, muscle weakness. ?Skin: Denies rash, itching, mass, tumors, sores, and boils ?Neurologic: Denies headache, fainting, dizziness, seizures, numbness, and tingling. ?Psychiatric: Denies depression, anxiety, difficulty sleeping, and memory loss. ?Endocrine: Denies heat or cold intolerance, and increased thirst or urination. ?Blood/lymph: Denies easy bruising, easy bruising, and swollen glands ? ? ?  ?Objective:  ?  ? ? ?Constitutional :  alert, cooperative, appears stated age, and no distress  ?Lymphatics/Throat:  no asymmetry, masses, or scars  ?Respiratory:  clear to auscultation bilaterally  ?Cardiovascular:  regular rate and rhythm  ?Gastrointestinal: soft, non-tender; bowel sounds normal; no masses,  no organomegaly. inguinal hernia noted.  left  ?Musculoskeletal: lying in bed, no obvious difficulty moving upper extremities  ?Skin: Cool and moist  ?Psychiatric: Normal affect, non-agitated, not confused  ?   ?  ?LABS:  ? ?  Latest Ref Rng & Units 06/01/2021  ?  9:58 AM 05/29/2021  ?  6:38 AM 05/28/2021  ?  6:05 AM  ?CMP  ?Glucose 70 - 99 mg/dL 102   74   81    ?BUN 6 - 20 mg/dL 14   22  25    ?Creatinine 0.61 - 1.24 mg/dL 1.30   1.71   1.64    ?Sodium 135 - 145 mmol/L 134   136   135    ?Potassium 3.5 - 5.1 mmol/L 4.4   4.0   4.0    ?Chloride 98 - 111 mmol/L 100   105   107    ?CO2 22 - 32 mmol/L '26   23   23    '$ ?Calcium 8.9 - 10.3 mg/dL 8.8   8.6   8.2    ?Total Protein 6.5 - 8.1 g/dL 6.6      ?Total Bilirubin 0.3 - 1.2 mg/dL 1.0      ?Alkaline Phos 38 - 126 U/L 58      ?AST 15 - 41 U/L 29      ?ALT 0 - 44 U/L 29      ? ? ?  Latest Ref Rng & Units 06/01/2021  ?  9:58 AM 05/29/2021  ?  6:38 AM 05/28/2021  ?  6:05 AM  ?CBC  ?WBC 4.0 - 10.5 K/uL 9.6   8.0   9.4    ?Hemoglobin 13.0 -  17.0 g/dL 13.7   13.2   12.4    ?Hematocrit 39.0 - 52.0 % 40.0   38.1   36.6    ?Platelets 150 - 400 K/uL 229   164   147    ? ? ?RADS: ?N/a ?Assessment:  ? ? ? ?Left inguinal hernia ?Plan:  ? ? ?Discussed the ri

## 2021-09-23 ENCOUNTER — Other Ambulatory Visit
Admission: RE | Admit: 2021-09-23 | Discharge: 2021-09-23 | Disposition: A | Discharge status: Home / Self Care | Payer: Medicare HMO | Source: Ambulatory Visit | Attending: Surgery | Admitting: Surgery

## 2021-09-23 ENCOUNTER — Other Ambulatory Visit: Payer: Self-pay

## 2021-09-23 NOTE — Patient Instructions (Addendum)
?Your procedure is scheduled on: Friday October 03, 2021. ?Report to Day Surgery inside [facility] 2nd floor, stop by admissions desk before getting on elevator.  ?To find out your arrival time please call [phone] between 1PM - 3PM on Thursday October 02, 2021. ? ?Remember: Instructions that are not followed completely may result in serious medical risk,  ?up to and including death, or upon the discretion of your surgeon and anesthesiologist your  ?surgery may need to be rescheduled.  ? ?  _X__ 1. Do not eat food after midnight the night before your procedure. ?                No chewing gum or hard candies. You may drink clear liquids up to 2 hours ?                before you are scheduled to arrive for your surgery- DO not drink clear ?                liquids within 2 hours of the start of your surgery. ?                Clear Liquids include:  water, apple juice without pulp, clear Gatorade, G2 or  ?                Gatorade Zero (avoid Red/Purple/Blue), Black Coffee or Tea (Do not add ?                anything to coffee or tea). ? ?__X__2.  On the morning of surgery brush your teeth with toothpaste and water, you ?               may rinse your mouth with mouthwash if you wish.  Do not swallow any toothpaste or mouthwash. ?   ? _X__ 3.  No Alcohol for 24 hours before or after surgery. ? ? _X__ 4.  Do Not Smoke or use e-cigarettes For 24 Hours Prior to Your Surgery. ?                Do not use any chewable tobacco products for at least 6 hours prior to ?                Surgery. ? ?_X__  5.  Do not use any recreational drugs (marijuana, cocaine, heroin, ecstasy, MDMA or other) ?               For at least one week prior to your surgery.  Combination of these drugs with anesthesia ?               May have life threatening results. ? ?____  6.  Bring all medications with you on the day of surgery if instructed.  ? ?__X_ 7.  Notify your doctor if there is any change in your medical condition  ?     (cold, fever, infections). ?    ?Do not wear jewelry, make-up, hairpins, clips or nail polish. ?Do not wear lotions, powders, or perfumes. You may wear deodorant. ?Do not shave 48 hours prior to surgery. Men may shave face and neck. ?Do not bring valuables to the hospital.   ? ?[facility] is not responsible for any belongings or valuables. ? ?Contacts, dentures or bridgework may not be worn into surgery. ?Leave your suitcase in the car. After surgery it may be brought to your room. ?For patients admitted to the hospital, discharge time is determined  by your ?treatment team. ?  ?Patients discharged the day of surgery will not be allowed to drive home.   ?Make arrangements for someone to be with you for the first 24 hours of your ?Same Day Discharge. ? ? ?__X__ Take these medicines the morning of surgery with A SIP OF WATER:  ? ? 1. busPIRone (BUSPAR) 10 MG  ? 2. dexlansoprazole (DEXILANT) 60 MG  ? 3.  ? 4. ? 5. ? 6. ? ?____ Fleet Enema (as directed)  ? ?__X__ Use Antibacterial Soap (or wipes) as directed ? ?____ Use Benzoyl Peroxide Gel as instructed ? ?__X__ Use inhalers on the day of surgery ? Fluticasone Propionate, Inhal, (FLOVENT DISKUS) 100 MCG/ACT AEPB ? albuterol (VENTOLIN HFA) 108 (90 Base) MCG/ACT inhaler ? ?____ Stop metformin 2 days prior to surgery   ? ?____ Take 1/2 of usual insulin dose the night before surgery. No insulin the morning ?         of surgery.  ? ?____ Call your PCP, cardiologist, or Pulmonologist if taking Coumadin/Plavix/aspirin and ask when to stop before your surgery.  ? ?__X__ One Week prior to surgery- Stop Anti-inflammatories such as Ibuprofen, Aleve, Advil, Motrin, meloxicam (MOBIC), diclofenac, etodolac, ketorolac, Toradol, Daypro, piroxicam, Goody's or BC powders. OK TO USE TYLENOL IF NEEDED ?  ?__X__ Stop supplements until after surgery.   ? ?____ Bring C-Pap to the hospital.  ? ? ?If you have any questions regarding your pre-procedure instructions,  ?Please call Pre-admit  Testing at [phone] ?

## 2021-09-29 ENCOUNTER — Other Ambulatory Visit: Payer: Self-pay | Admitting: Family Medicine

## 2021-09-29 DIAGNOSIS — K219 Gastro-esophageal reflux disease without esophagitis: Secondary | ICD-10-CM

## 2021-10-03 ENCOUNTER — Encounter: Admission: RE | Payer: Self-pay | Source: Ambulatory Visit

## 2021-10-03 ENCOUNTER — Ambulatory Visit: Admission: RE | Admit: 2021-10-03 | Payer: Medicare HMO | Source: Ambulatory Visit | Admitting: Surgery

## 2021-10-03 SURGERY — HERNIORRHAPHY, INGUINAL, ROBOT-ASSISTED, LAPAROSCOPIC
Anesthesia: General | Site: Groin

## 2021-10-16 ENCOUNTER — Ambulatory Visit: Payer: Self-pay | Admitting: Surgery

## 2021-10-20 ENCOUNTER — Other Ambulatory Visit: Payer: Self-pay

## 2021-10-20 ENCOUNTER — Encounter
Admission: RE | Admit: 2021-10-20 | Discharge: 2021-10-20 | Disposition: A | Discharge status: Home / Self Care | Payer: Medicare HMO | Source: Ambulatory Visit | Attending: Surgery | Admitting: Surgery

## 2021-10-20 NOTE — Patient Instructions (Addendum)
Your procedure is scheduled on: 10/24/2021 ?Report to the Registration Desk on the 1st floor of the [facility]. ?To find out your arrival time, please call [phone] between 1PM - 3PM on: 10/23/2021  ? ?REMEMBER: ?Instructions that are not followed completely may result in serious medical risk, up to and including death; or upon the discretion of your surgeon and anesthesiologist your surgery may need to be rescheduled. ? ?Do not eat food or drink after midnight the night before surgery.  ?No gum chewing, lozengers or hard candies. ? ? ?Continue taking you prescription medications unless you are instructed to stop before or on day of surgery ? ?TAKE THESE MEDICATIONS THE MORNING OF SURGERY WITH A SIP OF WATER: ?Buspar ?dexlansoprazole ([place]) ?Tramadol as needed ? ?Use the FLOVENT DISKUS inhaler  on the day of surgery and bring the albuterol to the hospital. You may use Flonase as well.  ? ? ?Follow recommendations from Cardiologist, Pulmonologist or PCP regarding stopping Aspirin. ? ?One week prior to surgery: ?Stop Anti-inflammatories (NSAIDS) such as Advil, Aleve, Ibuprofen, Motrin, Naproxen, Naprosyn and Aspirin based products such as Excedrin, Goodys Powder, BC Powder. ? ?Stop ANY OVER THE COUNTER supplements until after surgery like multivitamins . ?You may however, continue to take Tylenol if needed for pain up until the day of surgery. ? ?No Alcohol for 24 hours before or after surgery. ? ?No Smoking including e-cigarettes for 24 hours prior to surgery.  ?No chewable tobacco products for at least 6 hours prior to surgery.  ?No nicotine patches on the day of surgery. ? ?Do not use any "recreational" drugs for at least a week prior to your surgery.  ?Please be advised that the combination of cocaine and anesthesia may have negative outcomes, up to and including death. ?If you test positive for cocaine, your surgery will be cancelled. ? ?On the morning of surgery brush your teeth with toothpaste and  water, you may rinse your mouth with mouthwash if you wish. ?Do not swallow any toothpaste or mouthwash. ? ?Use CHG Soap with shower as directed on instruction sheet provided for you ? ?Do not wear jewelry, make-up, hairpins, clips or nail polish. ? ?Do not wear lotions, powders, or perfumes.  ? ?Do not shave body from the neck down 48 hours prior to surgery just in case you cut yourself which could leave a site for infection.  ?Also, freshly shaved skin may become irritated if using the CHG soap. ? ?Contact lenses, hearing aids and dentures may not be worn into surgery. ? ?Do not bring valuables to the hospital. [facility] is not responsible for any missing/lost belongings or valuables.  ? ? ?Notify your doctor if there is any change in your medical condition (cold, fever, infection). ? ?Wear comfortable clothing (specific to your surgery type) to the hospital. ? ?After surgery, you can help prevent lung complications by doing breathing exercises.  ?Take deep breaths and cough every 1-2 hours. Your doctor may order a device called an Incentive Spirometer to help you take deep breaths. ?When coughing or sneezing, hold a pillow firmly against your incision with both hands. This is called ?splinting.? Doing this helps protect your incision. It also decreases belly discomfort. ? ?If you are being admitted to the hospital overnight, leave your suitcase in the car. ?After surgery it may be brought to your room. ? ?If you are being discharged the day of surgery, you will not be allowed to drive home. ?You will need a responsible adult (18  years or older) to drive you home and stay with you that night.  ? ?If you are taking public transportation, you will need to have a responsible adult (18 years or older) with you. ?Please confirm with your physician that it is acceptable to use public transportation.  ? ?Please call the [facility] Dept. at [phone] if you have any questions about these  instructions. ? ?Surgery Visitation Policy: ? ?Patients undergoing a surgery or procedure may have two family members or support persons with them as long as the person is not COVID-19 positive or experiencing its symptoms.  ? ?Inpatient Visitation:   ? ?Visiting hours are 7 a.m. to 8 p.m. ?Up to four visitors are allowed at one time in a patient room, including children. The visitors may rotate out with other people during the day. One designated support person (adult) may remain overnight.  ?

## 2021-10-23 MED ORDER — CEFAZOLIN SODIUM-DEXTROSE 2-4 GM/100ML-% IV SOLN
2.0000 g | INTRAVENOUS | Status: AC
  Administered 2021-10-24: 2 g via INTRAVENOUS

## 2021-10-23 MED ORDER — ACETAMINOPHEN 500 MG PO TABS: 1000.0000 mg | ORAL_TABLET | ORAL | Status: DC

## 2021-10-23 MED ORDER — LACTATED RINGERS IV SOLN: INTRAVENOUS | Status: DC

## 2021-10-23 MED ORDER — GABAPENTIN 300 MG PO CAPS: 300.0000 mg | ORAL_CAPSULE | ORAL | Status: AC

## 2021-10-23 MED ORDER — CELECOXIB 200 MG PO CAPS: 200.0000 mg | ORAL_CAPSULE | ORAL | Status: AC

## 2021-10-23 MED ORDER — CHLORHEXIDINE GLUCONATE CLOTH 2 % EX PADS
6.0000 | MEDICATED_PAD | Freq: Once | CUTANEOUS | Status: AC
  Administered 2021-10-24: 6 via TOPICAL

## 2021-10-23 MED ORDER — CHLORHEXIDINE GLUCONATE 0.12 % MT SOLN: 15.0000 mL | Freq: Once | OROMUCOSAL | Status: AC

## 2021-10-23 MED ORDER — ORAL CARE MOUTH RINSE: 15.0000 mL | Freq: Once | OROMUCOSAL | Status: AC

## 2021-10-24 ENCOUNTER — Ambulatory Visit
Admission: RE | Admit: 2021-10-24 | Discharge: 2021-10-24 | Disposition: A | Discharge status: Home / Self Care | Payer: Medicare HMO | Attending: Surgery | Admitting: Surgery

## 2021-10-24 ENCOUNTER — Encounter: Payer: Self-pay | Admitting: Surgery

## 2021-10-24 ENCOUNTER — Other Ambulatory Visit: Payer: Self-pay

## 2021-10-24 ENCOUNTER — Ambulatory Visit: Payer: Medicare HMO | Admitting: Anesthesiology

## 2021-10-24 ENCOUNTER — Encounter
Admission: RE | Disposition: A | Discharge status: Home / Self Care | Payer: Self-pay | Source: Home / Self Care | Attending: Surgery

## 2021-10-24 DIAGNOSIS — N1832 Chronic kidney disease, stage 3b: Secondary | ICD-10-CM | POA: Insufficient documentation

## 2021-10-24 DIAGNOSIS — M1990 Unspecified osteoarthritis, unspecified site: Secondary | ICD-10-CM | POA: Insufficient documentation

## 2021-10-24 DIAGNOSIS — F419 Anxiety disorder, unspecified: Secondary | ICD-10-CM | POA: Insufficient documentation

## 2021-10-24 DIAGNOSIS — K219 Gastro-esophageal reflux disease without esophagitis: Secondary | ICD-10-CM | POA: Diagnosis not present

## 2021-10-24 DIAGNOSIS — K4090 Unilateral inguinal hernia, without obstruction or gangrene, not specified as recurrent: Secondary | ICD-10-CM | POA: Diagnosis present

## 2021-10-24 DIAGNOSIS — Z87891 Personal history of nicotine dependence: Secondary | ICD-10-CM | POA: Diagnosis not present

## 2021-10-24 DIAGNOSIS — J45909 Unspecified asthma, uncomplicated: Secondary | ICD-10-CM | POA: Insufficient documentation

## 2021-10-24 DIAGNOSIS — Z8673 Personal history of transient ischemic attack (TIA), and cerebral infarction without residual deficits: Secondary | ICD-10-CM | POA: Insufficient documentation

## 2021-10-24 DIAGNOSIS — N400 Enlarged prostate without lower urinary tract symptoms: Secondary | ICD-10-CM | POA: Diagnosis not present

## 2021-10-24 HISTORY — PX: INSERTION OF MESH: SHX5868

## 2021-10-24 SURGERY — HERNIORRHAPHY, INGUINAL, ROBOT-ASSISTED, LAPAROSCOPIC
Anesthesia: General | Site: Groin

## 2021-10-24 MED ORDER — FENTANYL CITRATE (PF) 100 MCG/2ML IJ SOLN
INTRAMUSCULAR | Status: DC | PRN
  Administered 2021-10-24 (×2): 50 ug via INTRAVENOUS

## 2021-10-24 MED ORDER — DEXAMETHASONE SODIUM PHOSPHATE 10 MG/ML IJ SOLN
INTRAMUSCULAR | Status: AC
  Filled 2021-10-24: qty 1

## 2021-10-24 MED ORDER — BUPIVACAINE-EPINEPHRINE (PF) 0.5% -1:200000 IJ SOLN
INTRAMUSCULAR | Status: AC
  Filled 2021-10-24: qty 30

## 2021-10-24 MED ORDER — ONDANSETRON HCL 4 MG/2ML IJ SOLN: 4.0000 mg | Freq: Once | INTRAMUSCULAR | Status: DC | PRN

## 2021-10-24 MED ORDER — DEXAMETHASONE SODIUM PHOSPHATE 10 MG/ML IJ SOLN
INTRAMUSCULAR | Status: DC | PRN
  Administered 2021-10-24: 10 mg via INTRAVENOUS

## 2021-10-24 MED ORDER — ACETAMINOPHEN 10 MG/ML IV SOLN
INTRAVENOUS | Status: AC
  Filled 2021-10-24: qty 100

## 2021-10-24 MED ORDER — LIDOCAINE HCL (CARDIAC) PF 100 MG/5ML IV SOSY
PREFILLED_SYRINGE | INTRAVENOUS | Status: DC | PRN
  Administered 2021-10-24: 50 mg via INTRAVENOUS

## 2021-10-24 MED ORDER — CELECOXIB 200 MG PO CAPS
ORAL_CAPSULE | ORAL | Status: AC
  Administered 2021-10-24: 200 mg via ORAL
  Filled 2021-10-24: qty 1

## 2021-10-24 MED ORDER — TRAMADOL HCL 50 MG PO TABS: 50.0000 mg | ORAL_TABLET | Freq: Three times a day (TID) | ORAL | 0 refills | Status: DC | PRN

## 2021-10-24 MED ORDER — ACETAMINOPHEN 10 MG/ML IV SOLN
INTRAVENOUS | Status: DC | PRN
  Administered 2021-10-24: 1000 mg via INTRAVENOUS

## 2021-10-24 MED ORDER — PROPOFOL 10 MG/ML IV BOLUS
INTRAVENOUS | Status: DC | PRN
  Administered 2021-10-24: 20 mg via INTRAVENOUS
  Administered 2021-10-24: 180 mg via INTRAVENOUS

## 2021-10-24 MED ORDER — MIDAZOLAM HCL 2 MG/2ML IJ SOLN
INTRAMUSCULAR | Status: DC | PRN
  Administered 2021-10-24: 2 mg via INTRAVENOUS

## 2021-10-24 MED ORDER — SUGAMMADEX SODIUM 200 MG/2ML IV SOLN
INTRAVENOUS | Status: DC | PRN
  Administered 2021-10-24: 200 mg via INTRAVENOUS

## 2021-10-24 MED ORDER — GABAPENTIN 300 MG PO CAPS
ORAL_CAPSULE | ORAL | Status: AC
  Administered 2021-10-24: 300 mg via ORAL
  Filled 2021-10-24: qty 1

## 2021-10-24 MED ORDER — BUPIVACAINE LIPOSOME 1.3 % IJ SUSP
INTRAMUSCULAR | Status: AC
  Filled 2021-10-24: qty 20

## 2021-10-24 MED ORDER — OXYCODONE HCL 5 MG PO TABS
ORAL_TABLET | ORAL | Status: AC
  Filled 2021-10-24: qty 1

## 2021-10-24 MED ORDER — GLYCOPYRROLATE 0.2 MG/ML IJ SOLN
INTRAMUSCULAR | Status: AC
  Filled 2021-10-24: qty 1

## 2021-10-24 MED ORDER — DOCUSATE SODIUM 100 MG PO CAPS: 100.0000 mg | ORAL_CAPSULE | Freq: Two times a day (BID) | ORAL | 0 refills | Status: AC | PRN

## 2021-10-24 MED ORDER — ONDANSETRON HCL 4 MG/2ML IJ SOLN
INTRAMUSCULAR | Status: DC | PRN
  Administered 2021-10-24: 4 mg via INTRAVENOUS

## 2021-10-24 MED ORDER — CHLORHEXIDINE GLUCONATE 0.12 % MT SOLN
OROMUCOSAL | Status: AC
  Administered 2021-10-24: 15 mL via OROMUCOSAL
  Filled 2021-10-24: qty 15

## 2021-10-24 MED ORDER — GLYCOPYRROLATE 0.2 MG/ML IJ SOLN
INTRAMUSCULAR | Status: DC | PRN
  Administered 2021-10-24: .2 mg via INTRAVENOUS

## 2021-10-24 MED ORDER — BUPIVACAINE-EPINEPHRINE 0.5% -1:200000 IJ SOLN
INTRAMUSCULAR | Status: DC | PRN
  Administered 2021-10-24: 15 mL

## 2021-10-24 MED ORDER — PROPOFOL 10 MG/ML IV BOLUS
INTRAVENOUS | Status: AC
  Filled 2021-10-24: qty 20

## 2021-10-24 MED ORDER — ROCURONIUM BROMIDE 100 MG/10ML IV SOLN
INTRAVENOUS | Status: DC | PRN
  Administered 2021-10-24: 50 mg via INTRAVENOUS

## 2021-10-24 MED ORDER — BUPIVACAINE LIPOSOME 1.3 % IJ SUSP
INTRAMUSCULAR | Status: DC | PRN
  Administered 2021-10-24: 20 mL

## 2021-10-24 MED ORDER — MIDAZOLAM HCL 2 MG/2ML IJ SOLN
INTRAMUSCULAR | Status: AC
  Filled 2021-10-24: qty 2

## 2021-10-24 MED ORDER — FENTANYL CITRATE (PF) 100 MCG/2ML IJ SOLN
INTRAMUSCULAR | Status: AC
  Filled 2021-10-24: qty 2

## 2021-10-24 MED ORDER — FENTANYL CITRATE (PF) 100 MCG/2ML IJ SOLN
INTRAMUSCULAR | Status: AC
  Administered 2021-10-24: 25 ug via INTRAVENOUS
  Filled 2021-10-24: qty 2

## 2021-10-24 MED ORDER — CEFAZOLIN SODIUM-DEXTROSE 2-4 GM/100ML-% IV SOLN
INTRAVENOUS | Status: AC
  Filled 2021-10-24: qty 100

## 2021-10-24 MED ORDER — DEXMEDETOMIDINE (PRECEDEX) IN NS 20 MCG/5ML (4 MCG/ML) IV SYRINGE
PREFILLED_SYRINGE | INTRAVENOUS | Status: DC | PRN
  Administered 2021-10-24: 12 ug via INTRAVENOUS

## 2021-10-24 MED ORDER — ROCURONIUM BROMIDE 10 MG/ML (PF) SYRINGE
PREFILLED_SYRINGE | INTRAVENOUS | Status: AC
  Filled 2021-10-24: qty 10

## 2021-10-24 MED ORDER — OXYCODONE HCL 5 MG PO TABS
5.0000 mg | ORAL_TABLET | Freq: Once | ORAL | Status: AC
  Administered 2021-10-24: 5 mg via ORAL

## 2021-10-24 MED ORDER — ONDANSETRON HCL 4 MG/2ML IJ SOLN
INTRAMUSCULAR | Status: AC
  Filled 2021-10-24: qty 2

## 2021-10-24 MED ORDER — FENTANYL CITRATE (PF) 100 MCG/2ML IJ SOLN
25.0000 ug | INTRAMUSCULAR | Status: DC | PRN
  Administered 2021-10-24: 25 ug via INTRAVENOUS

## 2021-10-24 SURGICAL SUPPLY — 50 items
ADH SKN CLS APL DERMABOND .7 (GAUZE/BANDAGES/DRESSINGS) ×2
BAG INFUSER PRESSURE 100CC (MISCELLANEOUS) IMPLANT
BLADE SURG SZ11 CARB STEEL (BLADE) ×3 IMPLANT
BNDG GAUZE ELAST 4 BULKY (GAUZE/BANDAGES/DRESSINGS) ×4 IMPLANT
COVER TIP SHEARS 8 DVNC (MISCELLANEOUS) ×2 IMPLANT
COVER TIP SHEARS 8MM DA VINCI (MISCELLANEOUS) ×3
DERMABOND ADVANCED (GAUZE/BANDAGES/DRESSINGS) ×1
DERMABOND ADVANCED .7 DNX12 (GAUZE/BANDAGES/DRESSINGS) ×3 IMPLANT
DRAPE ARM DVNC X/XI (DISPOSABLE) ×6 IMPLANT
DRAPE COLUMN DVNC XI (DISPOSABLE) ×3 IMPLANT
DRAPE DA VINCI XI ARM (DISPOSABLE) ×9
DRAPE DA VINCI XI COLUMN (DISPOSABLE) ×3
ELECT CAUTERY BLADE 6.4 (BLADE) IMPLANT
ELECT REM PT RETURN 9FT ADLT (ELECTROSURGICAL) ×3
ELECTRODE REM PT RTRN 9FT ADLT (ELECTROSURGICAL) ×2 IMPLANT
GLOVE BIOGEL PI IND STRL 7.0 (GLOVE) ×4 IMPLANT
GLOVE BIOGEL PI INDICATOR 7.0 (GLOVE) ×2
GLOVE SURG SYN 6.5 ES PF (GLOVE) ×6 IMPLANT
GLOVE SURG SYN 6.5 PF PI (GLOVE) ×6 IMPLANT
GOWN STRL REUS W/ TWL LRG LVL3 (GOWN DISPOSABLE) ×6 IMPLANT
GOWN STRL REUS W/TWL LRG LVL3 (GOWN DISPOSABLE) ×9
IRRIGATOR SUCT 8 DISP DVNC XI (IRRIGATION / IRRIGATOR) IMPLANT
IRRIGATOR SUCTION 8MM XI DISP (IRRIGATION / IRRIGATOR)
IV NS 1000ML (IV SOLUTION)
IV NS 1000ML BAXH (IV SOLUTION) IMPLANT
LABEL OR SOLS (LABEL) IMPLANT
MANIFOLD NEPTUNE II (INSTRUMENTS) ×3 IMPLANT
MESH 3DMAX 4X6 LT LRG (Mesh General) ×1 IMPLANT
MESH 3DMAX MID 4X6 LT LRG (Mesh General) IMPLANT
NDL INSUFFLATION 14GA 120MM (NEEDLE) ×2 IMPLANT
NEEDLE HYPO 22GX1.5 SAFETY (NEEDLE) ×3 IMPLANT
NEEDLE INSUFFLATION 14GA 120MM (NEEDLE) ×3 IMPLANT
OBTURATOR OPTICAL STANDARD 8MM (TROCAR) ×3
OBTURATOR OPTICAL STND 8 DVNC (TROCAR) ×2
OBTURATOR OPTICALSTD 8 DVNC (TROCAR) ×2 IMPLANT
PACK LAP CHOLECYSTECTOMY (MISCELLANEOUS) ×4 IMPLANT
PENCIL ELECTRO HAND CTR (MISCELLANEOUS) ×4 IMPLANT
SEAL CANN UNIV 5-8 DVNC XI (MISCELLANEOUS) ×6 IMPLANT
SEAL XI 5MM-8MM UNIVERSAL (MISCELLANEOUS) ×9
SET TUBE SMOKE EVAC HIGH FLOW (TUBING) ×3 IMPLANT
SOLUTION ELECTROLUBE (MISCELLANEOUS) ×3 IMPLANT
SUT MNCRL 4-0 (SUTURE) ×6
SUT MNCRL 4-0 27XMFL (SUTURE) ×4
SUT VIC AB 2-0 SH 27 (SUTURE) ×3
SUT VIC AB 2-0 SH 27XBRD (SUTURE) ×3 IMPLANT
SUT VLOC 90 6 CV-15 VIOLET (SUTURE) ×6 IMPLANT
SUTURE MNCRL 4-0 27XMF (SUTURE) ×3 IMPLANT
SYR 30ML LL (SYRINGE) ×3 IMPLANT
TAPE TRANSPORE STRL 2 31045 (GAUZE/BANDAGES/DRESSINGS) ×3 IMPLANT
WATER STERILE IRR 500ML POUR (IV SOLUTION) ×4 IMPLANT

## 2021-10-24 NOTE — Anesthesia Postprocedure Evaluation (Signed)
Anesthesia Post Note ? ?Patient: Randy Dean ? ?Procedure(s) Performed: XI ROBOTIC ASSISTED INGUINAL HERNIA (Groin) ?INSERTION OF MESH ? ?Patient location during evaluation: PACU ?Anesthesia Type: General ?Level of consciousness: awake and awake and alert ?Pain management: satisfactory to patient ?Vital Signs Assessment: post-procedure vital signs reviewed and stable ?Respiratory status: spontaneous breathing and respiratory function stable ?Cardiovascular status: stable ?Anesthetic complications: no ? ? ?No notable events documented. ? ? ?Last Vitals:  ?Vitals:  ? 10/24/21 1415 10/24/21 1430  ?BP: 139/87 137/82  ?Pulse: 76 65  ?Resp: (!) 21 18  ?Temp:    ?SpO2: 94% 95%  ?  ?Last Pain:  ?Vitals:  ? 10/24/21 1430  ?TempSrc:   ?PainSc: Asleep  ? ? ?  ?  ?  ?  ?  ?  ? ?VAN STAVEREN,Sharlett Lienemann ? ? ? ? ?

## 2021-10-24 NOTE — Interval H&P Note (Signed)
History and Physical Interval Note: ? ?10/24/2021 ?11:51 AM ? ?Randy Dean  has presented today for surgery, with the diagnosis of Incarcerated Hernia.  The various methods of treatment have been discussed with the patient and family. After consideration of risks, benefits and other options for treatment, the patient has consented to  Procedure(s): ?[place] (N/A) as a surgical intervention.  The patient's history has been reviewed, patient examined, no change in status, stable for surgery.  I have reviewed the patient's chart and labs.  Questions were answered to the patient's satisfaction.   ? ?Again discussed possibility leg pain may not resolve but groin pain likely will. Also discussed increased risk of converting to open procedure due to previous surgical history.  Pt and wife both verbalized understanding, still ok to proceed ? ? ?[name] ? ? ?

## 2021-10-24 NOTE — Transfer of Care (Signed)
Immediate Anesthesia Transfer of Care Note ? ?Patient: Randy Dean ? ?Procedure(s) Performed: XI ROBOTIC ASSISTED INGUINAL HERNIA (Groin) ?INSERTION OF MESH ? ?Patient Location: PACU ? ?Anesthesia Type:General ? ?Level of Consciousness: sedated ? ?Airway & Oxygen Therapy: Patient Spontanous Breathing and Patient connected to face mask oxygen ? ?Post-op Assessment: Report given to RN and Post -op Vital signs reviewed and stable ? ?Post vital signs: Reviewed ? ?Last Vitals:  ?Vitals Value Taken Time  ?BP    ?Temp    ?Pulse 65 10/24/21 1340  ?Resp 20 10/24/21 1340  ?SpO2 96 % 10/24/21 1340  ?Vitals shown include unvalidated device data. ? ?Last Pain:  ?Vitals:  ? 10/24/21 0958  ?TempSrc: Oral  ?PainSc: 0-No pain  ?   ? ?  ? ?Complications: No notable events documented. ?

## 2021-10-24 NOTE — Anesthesia Preprocedure Evaluation (Signed)
Anesthesia Evaluation  ?Patient identified by MRN, date of birth, ID band ?Patient awake ? ? ? ?Reviewed: ?Allergy & Precautions, NPO status , Patient's Chart, lab work & pertinent test results ? ?Airway ?Mallampati: III ? ?TM Distance: >3 FB ?Neck ROM: Full ? ? ? Dental ? ?(+) Teeth Intact ?  ?Pulmonary ?neg pulmonary ROS, asthma , former smoker,  ?  ?Pulmonary exam normal ?breath sounds clear to auscultation ? ? ? ? ? ? Cardiovascular ?Exercise Tolerance: Good ?negative cardio ROS ?Normal cardiovascular exam ?Rhythm:Regular Rate:Normal ? ? ?  ?Neuro/Psych ?Anxiety Depression TIAnegative neurological ROS ? negative psych ROS  ? GI/Hepatic ?negative GI ROS, Neg liver ROS, GERD  ,  ?Endo/Other  ?negative endocrine ROS ? Renal/GU ?  ? ?  ?Musculoskeletal ? ?(+) Arthritis ,  ? Abdominal ?(+) + obese,   ?Peds ?negative pediatric ROS ?(+)  Hematology ?negative hematology ROS ?(+)   ?Anesthesia Other Findings ?Past Medical History: ?10/30/2015: Abnormality of gait ?No date: Allergic rhinitis ?No date: Anxiety ?No date: Arthritis ?No date: Asthma ?    Comment:  well controlled ?No date: BPH (benign prostatic hyperplasia) ?No date: Chronic headaches ?    Comment:  partially sinus/partially brain injury (per pt) ?No date: Claustrophobia ?No date: Complication of anesthesia ?    Comment:  hard to wake up after 1 surgery but pt had had  ?             3surgeries close together ?No date: Dyspnea ?No date: GERD (gastroesophageal reflux disease) ?2010: Head trauma ?    Comment:  has caused anxiety, memory issues and neck problems ?10/30/2015: Headache ?    Comment:  Right temporal  ?06/2019: Ischemic stroke [facility]) ?No date: Kidney damage ?    Comment:  due to years of taking meloxicam after head injury ?No date: Neck stiffness ?    Comment:  metal plate.  mild limitations of side to side and up  ?             and dowm movement ?No date: Prediabetes ?No date: PTSD (post-traumatic stress  disorder) ?No date: RAD (reactive airway disease) ?No date: Spinal cord compression ([place]) ?    Comment:  -cervical from brain trauma ?No date: Stage 3b chronic kidney disease ([place]) ?No date: Stroke [facility]) ?    Comment:  TIA x3- some residual right side weakness ?No date: Vertigo ?No date: Weakness of right side of body ?    Comment:  from TIAs ? ?Past Surgical History: ?1996: BACK SURGERY ?    Comment:  spinal surgery and neck 2x ?05/26/2021: COLON SURGERY ?    Comment:  due to Diverticulitis ?03/02/2019: COLONOSCOPY WITH PROPOFOL; N/A ?    Comment:  Procedure: COLONOSCOPY WITH PROPOFOL;  Surgeon: Lysle Pearl,  ?             Isami, DO;  Location: [facility] ENDOSCOPY;  Service: General;  ?             Laterality: N/A; ?No date: DIAGNOSTIC LAPAROSCOPY ?08/22/2015: ENDOSCOPIC TURBINATE REDUCTION; Bilateral ?    Comment:  Procedure: ENDOSCOPIC TURBINATE REDUCTION;  Surgeon:  ?             Margaretha Sheffield, MD;  Location: [facility];   ?             Service: ENT;  Laterality: Bilateral; ?No date: EYE MUSCLE SURGERY; Left ?    Comment:  Due to conential left amblyopia x2 ?No date: FINGER FRACTURE SURGERY; Right ?  Comment:  index finger ?02/28/2015: NASAL SINUS SURGERY ?No date: NECK SURGERY ?    Comment:  x3-metal plate in neck ?1990: SHOULDER SURGERY; Right ? ?BMI   ? Body Mass Index: 28.21 kg/m?  ?  ? ? Reproductive/Obstetrics ?negative OB ROS ? ?  ? ? ? ? ? ? ? ? ? ? ? ? ? ?  ?  ? ? ? ? ? ? ? ? ?Anesthesia Physical ?Anesthesia Plan ? ?ASA: 3 ? ?Anesthesia Plan: General  ? ?Post-op Pain Management:   ? ?Induction: Intravenous ? ?PONV Risk Score and Plan: Ondansetron, Dexamethasone, Midazolam and Treatment may vary due to age or medical condition ? ?Airway Management Planned: Oral ETT ? ?Additional Equipment:  ? ?Intra-op Plan:  ? ?Post-operative Plan: Extubation in OR ? ?Informed Consent: I have reviewed the patients History and Physical, chart, labs and discussed the procedure including the risks, benefits and alternatives  for the proposed anesthesia with the patient or authorized representative who has indicated his/her understanding and acceptance.  ? ? ? ?Dental Advisory Given ? ?Plan Discussed with: CRNA and Surgeon ? ?Anesthesia Plan Comments:   ? ? ? ? ? ? ?Anesthesia Quick Evaluation ? ?

## 2021-10-24 NOTE — H&P (Signed)
Subjective:  ?  ?CC: left inguinal hernia ?  ?HPI: ? Randy Dean is a 56 y.o. male who returns for evaluation of above cc.   Discomfort in area has worsened the past few days, will like to consider repair now.  First noted during operation for his diverticulitis. ?  ?Past Medical History:  has a past medical history of Abnormality of gait (10/30/2015), Allergic rhinitis, Anxiety, Arthritis, Asthma, BPH (benign prostatic hyperplasia), Chronic headaches, Claustrophobia, Complication of anesthesia, Dyspnea, GERD (gastroesophageal reflux disease), Head trauma (2010), Headache (10/30/2015), Ischemic stroke ([place]) (06/2019), Kidney damage, Neck stiffness, Prediabetes, PTSD (post-traumatic stress disorder), RAD (reactive airway disease), Spinal cord compression ([place]), Stage 3b chronic kidney disease ([place]), Stroke ([place]), Vertigo, and Weakness of right side of body. ?  ?Past Surgical History:  ?     ?Past Surgical History:  ?Procedure Laterality Date  ? COLONOSCOPY WITH PROPOFOL N/A 03/02/2019  ?  Procedure: COLONOSCOPY WITH PROPOFOL;  Surgeon: Benjamine Sprague, DO;  Location: [facility] ENDOSCOPY;  Service: General;  Laterality: N/A;  ? DIAGNOSTIC LAPAROSCOPY      ? ENDOSCOPIC TURBINATE REDUCTION Bilateral 08/22/2015  ?  Procedure: ENDOSCOPIC TURBINATE REDUCTION;  Surgeon: Margaretha Sheffield, MD;  Location: [facility];  Service: ENT;  Laterality: Bilateral;  ? EYE MUSCLE SURGERY Left    ?  Due to conential left amblyopia x2  ? FINGER FRACTURE SURGERY Right    ?  index finger  ? NASAL SINUS SURGERY   02/28/2015  ? NECK SURGERY      ?  x3-metal plate in neck  ? SHOULDER SURGERY      ?  ?  ?Family History: family history includes Diabetes in his maternal grandmother and paternal grandmother; Fibromyalgia in his mother; Heart disease in his father; Hypertension in his maternal grandfather, maternal grandmother, paternal grandfather, and paternal grandmother; Tremor in his father. ?  ?Social History:  reports that he quit smoking  about 37 years ago. His smoking use included cigarettes. He has a 1.50 pack-year smoking history. His smokeless tobacco use includes snuff. He reports that he does not drink alcohol and does not use drugs. ?  ?Current Medications:  ?       ?Prior to Admission medications   ?Medication Sig Start Date End Date Taking? Authorizing Provider  ?acetaminophen (TYLENOL) 650 MG CR tablet Take 1,300 mg by mouth every 8 (eight) hours as needed for pain.       [provider]  ?albuterol (VENTOLIN HFA) 108 (90 Base) MCG/ACT inhaler Inhale 2 puffs into the lungs every 6 (six) hours as needed for wheezing or shortness of breath. 09/16/20     Lavonia Drafts, MD  ?aspirin EC 81 MG tablet Take 81 mg by mouth daily.       [provider]  ?busPIRone (BUSPAR) 10 MG tablet Take 10 mg by mouth daily.       [provider]  ?busPIRone (BUSPAR) 15 MG tablet Take 1 tablet (15 mg total) by mouth at bedtime as needed (anxiety). ?Patient taking differently: Take 15 mg by mouth at bedtime. 05/29/21     Benjamine Sprague, DO  ?dexlansoprazole (DEXILANT) 60 MG capsule Take 1 capsule (60 mg total) by mouth daily. 07/25/20     Jerrol Banana., MD  ?famotidine (PEPCID) 40 MG tablet Take 1 tablet (40 mg total) by mouth daily. ?Patient taking differently: Take 40 mg by mouth at bedtime. 07/25/20     Jerrol Banana., MD  ?fluticasone Asencion Islam) 50 MCG/ACT  nasal spray Place 2 sprays into both nostrils 2 (two) times daily. 07/25/20     Jerrol Banana., MD  ?Fluticasone Propionate, Inhal, (FLOVENT DISKUS) 100 MCG/ACT AEPB Inhale 1 puff into the lungs 2 (two) times daily.       [provider]  ?hydrochlorothiazide (HYDRODIURIL) 25 MG tablet Take 25 mg by mouth daily as needed (swelling). 10/23/20 10/23/21   [provider]  ?loratadine (CLARITIN) 10 MG tablet Take 10 mg by mouth daily.       [provider]  ?Multiple Vitamin (MULTIVITAMIN WITH MINERALS) TABS tablet Take 1 tablet by mouth  daily.       [provider]  ?nystatin (MYCOSTATIN) 100000 UNIT/ML suspension Take 5 mLs (500,000 Units total) by mouth daily as needed (thrush). ?Patient taking differently: Take 5 mLs by mouth 4 (four) times daily. 05/29/21     Benjamine Sprague, DO  ?phenylephrine (SUDAFED PE) 10 MG TABS tablet Take 10 mg by mouth 2 (two) times daily with a meal.       [provider]  ?polyethylene glycol powder (GLYCOLAX/MIRALAX) 17 GM/SCOOP powder Take 17 g by mouth daily as needed for moderate constipation. 06/01/21     Harvest Dark, MD  ?Rimegepant Sulfate (NURTEC) 75 MG TBDP Take 75 mg by mouth daily as needed (migraine). 05/29/21     Benjamine Sprague, DO  ?tamsulosin (FLOMAX) 0.4 MG CAPS capsule Take 0.4 mg by mouth daily after supper.       [provider]  ?traMADol (ULTRAM) 50 MG tablet Take 50 mg by mouth every 8 (eight) hours as needed for moderate pain.       [provider]  ?  ?  ?Allergies:  ?     ?Allergies as of 09/22/2021 - Review Complete 09/17/2021  ?Allergen Reaction Noted  ? Serzone [nefazodone] Swelling 03/14/2013  ? Ciprofloxacin   08/29/2021  ? Codeine Nausea Only 03/14/2013  ? Doxycycline Other (See Comments) 08/17/2018  ? Effexor [venlafaxine]   07/02/2016  ? Montelukast sodium   05/07/2021  ? Other   05/07/2021  ? Paxil [paroxetine hcl]   07/02/2016  ? T-pa [alteplase] Swelling 07/22/2019  ? Esomeprazole sodium Rash 01/02/2016  ?  ?  ?ROS:  ?General: Denies weight loss, weight gain, fatigue, fevers, chills, and night sweats. ?Eyes: Denies blurry vision, double vision, eye pain, itchy eyes, and tearing. ?Ears: Denies hearing loss, earache, and ringing in ears. ?Nose: Denies sinus pain, congestion, infections, runny nose, and nosebleeds. ?Mouth/throat: Denies hoarseness, sore throat, bleeding gums, and difficulty swallowing. ?Heart: Denies chest pain, palpitations, racing heart, irregular heartbeat, leg pain or swelling, and decreased activity tolerance. ?Respiratory:  Denies breathing difficulty, shortness of breath, wheezing, cough, and sputum. ?GI: Denies change in appetite, heartburn, nausea, vomiting, constipation, diarrhea, and blood in stool. ?GU: Denies difficulty urinating, pain with urinating, urgency, frequency, blood in urine. ?Musculoskeletal: Denies joint stiffness, pain, swelling, muscle weakness. ?Skin: Denies rash, itching, mass, tumors, sores, and boils ?Neurologic: Denies headache, fainting, dizziness, seizures, numbness, and tingling. ?Psychiatric: Denies depression, anxiety, difficulty sleeping, and memory loss. ?Endocrine: Denies heat or cold intolerance, and increased thirst or urination. ?Blood/lymph: Denies easy bruising, easy bruising, and swollen glands ?  ?  ?  ?Objective:  ?  ?  ?Constitutional :  alert, cooperative, appears stated age, and no distress  ?Lymphatics/Throat:  no asymmetry, masses, or scars  ?Respiratory:  clear to auscultation bilaterally  ?Cardiovascular:  regular rate and rhythm  ?Gastrointestinal: soft, non-tender; bowel sounds normal; no  masses,  no organomegaly. inguinal hernia noted.  left  ?Musculoskeletal: lying in bed, no obvious difficulty moving upper extremities  ?Skin: Cool and moist  ?Psychiatric: Normal affect, non-agitated, not confused  ?     ?  ?LABS:  ?  ?  Latest Ref Rng & Units 06/01/2021  ?  9:58 AM 05/29/2021  ?  6:38 AM 05/28/2021  ?  6:05 AM  ?CMP  ?Glucose 70 - 99 mg/dL 102   74   81    ?BUN 6 - 20 mg/dL '14   22   25    '$ ?Creatinine 0.61 - 1.24 mg/dL 1.30   1.71   1.64    ?Sodium 135 - 145 mmol/L 134   136   135    ?Potassium 3.5 - 5.1 mmol/L 4.4   4.0   4.0    ?Chloride 98 - 111 mmol/L 100   105   107    ?CO2 22 - 32 mmol/L '26   23   23    '$ ?Calcium 8.9 - 10.3 mg/dL 8.8   8.6   8.2    ?Total Protein 6.5 - 8.1 g/dL 6.6        ?Total Bilirubin 0.3 - 1.2 mg/dL 1.0        ?Alkaline Phos 38 - 126 U/L 58        ?AST 15 - 41 U/L 29        ?ALT 0 - 44 U/L 29        ?  ?  ?  Latest Ref Rng & Units 06/01/2021  ?  9:58 AM  05/29/2021  ?  6:38 AM 05/28/2021  ?  6:05 AM  ?CBC  ?WBC 4.0 - 10.5 K/uL 9.6   8.0   9.4    ?Hemoglobin 13.0 - 17.0 g/dL 13.7   13.2   12.4    ?Hematocrit 39.0 - 52.0 % 40.0   38.1   36.6    ?Platelets 150 - 400 K

## 2021-10-24 NOTE — Discharge Instructions (Addendum)
Hernia repair, Care After This sheet gives you information about how to care for yourself after your procedure. Your health care provider may also give you more specific instructions. If you have problems or questions, contact your health care provider. What can I expect after the procedure? After your procedure, it is common to have the following: Pain in your abdomen, especially in the incision areas. You will be given medicine to control the pain. Tiredness. This is a normal part of the recovery process. Your energy level will return to normal over the next several weeks. Changes in your bowel movements, such as constipation or needing to go more often. Talk with your health care provider about how to manage this. Follow these instructions at home: Medicines  tylenol and advil as needed for discomfort.  Please alternate between the two every four hours as needed for pain.    Use narcotics, if prescribed, only when tylenol and motrin is not enough to control pain.  325-650mg every 8hrs to max of 3000mg/24hrs (including the 325mg in every norco dose) for the tylenol.    Advil up to 800mg per dose every 8hrs as needed for pain.   PLEASE RECORD NUMBER OF PILLS TAKEN UNTIL NEXT FOLLOW UP APPT.  THIS WILL HELP DETERMINE HOW READY YOU ARE TO BE RELEASED FROM ANY ACTIVITY RESTRICTIONS Do not drive or use heavy machinery while taking prescription pain medicine. Do not drink alcohol while taking prescription pain medicine.  Incision care    Follow instructions from your health care provider about how to take care of your incision areas. Make sure you: Keep your incisions clean and dry. Wash your hands with soap and water before and after applying medicine to the areas, and before and after changing your bandage (dressing). If soap and water are not available, use hand sanitizer. Change your dressing as told by your health care provider. Leave stitches (sutures), skin glue, or adhesive strips in  place. These skin closures may need to stay in place for 2 weeks or longer. If adhesive strip edges start to loosen and curl up, you may trim the loose edges. Do not remove adhesive strips completely unless your health care provider tells you to do that. Do not wear tight clothing over the incisions. Tight clothing may rub and irritate the incision areas, which may cause the incisions to open. Do not take baths, swim, or use a hot tub until your health care provider approves. OK TO SHOWER IN 24HRS.   Check your incision area every day for signs of infection. Check for: More redness, swelling, or pain. More fluid or blood. Warmth. Pus or a bad smell. Activity Avoid lifting anything that is heavier than 10 lb (4.5 kg) for 2 weeks or until your health care provider says it is okay. No pushing/pulling greater than 30lbs You may resume normal activities as told by your health care provider. Ask your health care provider what activities are safe for you. Take rest breaks during the day as needed. Eating and drinking Follow instructions from your health care provider about what you can eat after surgery. To prevent or treat constipation while you are taking prescription pain medicine, your health care provider may recommend that you: Drink enough fluid to keep your urine clear or pale yellow. Take over-the-counter or prescription medicines. Eat foods that are high in fiber, such as fresh fruits and vegetables, whole grains, and beans. Limit foods that are high in fat and processed sugars, such as fried and   sweet foods. General instructions Ask your health care provider when you will need an appointment to get your sutures or staples removed. Keep all follow-up visits as told by your health care provider. This is important. Contact a health care provider if: You have more redness, swelling, or pain around your incisions. You have more fluid or blood coming from the incisions. Your incisions feel  warm to the touch. You have pus or a bad smell coming from your incisions or your dressing. You have a fever. You have an incision that breaks open (edges not staying together) after sutures or staples have been removed. You develop a rash. You have chest pain or difficulty breathing. You have pain or swelling in your legs. You feel light-headed or you faint. Your abdomen swells (becomes distended). You have nausea or vomiting. You have blood in your stool (feces). This information is not intended to replace advice given to you by your health care provider. Make sure you discuss any questions you have with your health care provider. Document Released: 01/02/2005 Document Revised: 03/04/2018 Document Reviewed: 03/16/2016 Elsevier Interactive Patient Education  2019 [employer]   AMBULATORY SURGERY  DISCHARGE INSTRUCTIONS   The drugs that you were given will stay in your system until tomorrow so for the next 24 hours you should not:  Drive an automobile Make any legal decisions Drink any alcoholic beverage   You may resume regular meals tomorrow.  Today it is better to start with liquids and gradually work up to solid foods.  You may eat anything you prefer, but it is better to start with liquids, then soup and crackers, and gradually work up to solid foods.   Please notify your doctor immediately if you have any unusual bleeding, trouble breathing, redness and pain at the surgery site, drainage, fever, or pain not relieved by medication.    Additional Instructions:        Please contact your physician with any problems or Same Day Surgery at [phone], Monday through Friday 6 am to 4 pm, or [place] [facility] [place] Main number at [phone].  

## 2021-10-24 NOTE — Op Note (Signed)
Preoperative diagnosis: left, initial, reducible inguinal Hernia. ? ?Postoperative diagnosis: left inguinal Hernia ? ?Procedure: Robotic assisted laparoscopic left inguinal hernia repair with mesh ? ?Anesthesia: General ? ?Surgeon: Dr. Lysle Pearl ? ?Wound Classification: Clean ? ?Specimen: none ? ?Complications: None ? ?Estimated Blood Loss: 14m ? ? ?Indications:  inguinal hernia. Repair was indicated to avoid complications of incarceration, obstruction and pain, and a prosthetic mesh repair was elected.  See H&P for further details. ? ?Findings: ?1. Vas Deferens and cord structures identified and preserved ?2. Bard 3D max medium weight mesh used for repair ?3. Adequate hemostasis achieved ? ?Description of procedure: ?The patient was taken to the operating room. A time-out was completed verifying correct patient, procedure, site, positioning, and implant(s) and/or special equipment prior to beginning this procedure.  Area was prepped and draped in the usual sterile fashion. An incision was marked 20 cm above the pubic tubercle, slightly above the umbilicus  Scrotum wrapped in Kerlix roll. ? ?Veress needle inserted at palmer's point.  Saline drop test noted to be positive with gradual increase in pressure after initiation of gas insufflation.  15 mm of pressure was achieved prior to removing the Veress needle and then placing a 8 mm port via the Optiview technique through the supraumbilical site.  Inspection of the area afterwards noted no injury to the surrounding organs during insertion of the needle and the port.  2 port sites were marked 8 cm to the lateral sides of the initial port, and a 8 mm robotic port was placed on the left side, another 8 mm robotic port on the right side under direct supervision.  Local anesthesia  infused to the preplanned incision sites prior to insertion of the port.  The dCraftonwas then brought into the operative field and docked to the ports. ? ?Examination of the  abdominal cavity noted a left inguinal hernia.  A peritoneal flap was created approximately 8cm cephalad to the defect by using scissors with electrocautery.  Dissection was carried down towards the pubic tubercle, developing the myopectineal orifice view.  Laterally the flap was carried towards the ASIS.  medium hernia sac was noted, which carefully dissected away from the adjacent tissues to be fully reduced out of hernia cavity.  Any bleeding was controlled with combination of electrocautery and manual pressure.   ? ?After confirming adequate dissection and the peritoneal reflection completely down and away from the cord structures, a Large Bard 3DMax medium weight mesh was placed within the anterior abdominal wall, secured in place using 2-0 Vicryl on an SH needle immediately above the pubic tubercle.  After noting proper placement of the mesh with the peritoneal reflection deep to it, the previously created peritoneal flap was secured back up to the anterior abdominal wall using running 3-0 V-Lock.  Both needles were then removed out of the abdominal cavity, Xi platform undocked from the ports and removed off of operative field.  exparel infused as ilioinguinal block. ? ?Abdomen then desufflated and ports removed. All the skin incisions were then closed with a subcuticular stitch of Monocryl 4-0. Dermabond was applied. The testis was gently pulled down into its anatomic position in the scrotum.  The patient tolerated the procedure well and was taken to the postanesthesia care unit in stable condition. Sponge and instrument count correct at end of procedure. ? ?

## 2021-10-24 NOTE — Anesthesia Procedure Notes (Signed)
Procedure Name: Intubation ?Date/Time: 10/24/2021 12:26 PM ?Performed by: Rolla Plate, CRNA ?Pre-anesthesia Checklist: Patient identified, Patient being monitored, Timeout performed, Emergency Drugs available and Suction available ?Patient Re-evaluated:Patient Re-evaluated prior to induction ?Oxygen Delivery Method: Circle system utilized ?Preoxygenation: Pre-oxygenation with 100% oxygen ?Induction Type: IV induction ?Ventilation: Mask ventilation without difficulty ?Laryngoscope Size: McGraph and 4 ?Grade View: Grade I ?Tube type: Oral ?Tube size: 7.5 mm ?Number of attempts: 1 ?Airway Equipment and Method: Stylet and Video-laryngoscopy ?Placement Confirmation: ETT inserted through vocal cords under direct vision, positive ETCO2 and breath sounds checked- equal and bilateral ?Secured at: 22 cm ?Tube secured with: Tape ?Dental Injury: Teeth and Oropharynx as per pre-operative assessment  ? ? ? ? ?

## 2021-10-26 ENCOUNTER — Encounter: Payer: Self-pay | Admitting: Surgery

## 2021-11-29 ENCOUNTER — Other Ambulatory Visit: Payer: Self-pay | Admitting: Neurology

## 2021-11-29 DIAGNOSIS — M539 Dorsopathy, unspecified: Secondary | ICD-10-CM

## 2021-12-03 ENCOUNTER — Other Ambulatory Visit: Payer: Self-pay | Admitting: Neurology

## 2021-12-03 DIAGNOSIS — M539 Dorsopathy, unspecified: Secondary | ICD-10-CM

## 2021-12-07 ENCOUNTER — Ambulatory Visit
Admission: RE | Admit: 2021-12-07 | Discharge: 2021-12-07 | Disposition: A | Discharge status: Home / Self Care | Payer: Medicare HMO | Source: Ambulatory Visit | Attending: Neurology | Admitting: Neurology

## 2021-12-07 DIAGNOSIS — M5416 Radiculopathy, lumbar region: Secondary | ICD-10-CM

## 2021-12-07 DIAGNOSIS — M539 Dorsopathy, unspecified: Secondary | ICD-10-CM

## 2021-12-26 ENCOUNTER — Other Ambulatory Visit (HOSPITAL_COMMUNITY): Payer: Self-pay | Admitting: Neurology

## 2021-12-26 ENCOUNTER — Other Ambulatory Visit: Payer: Self-pay | Admitting: Neurology

## 2021-12-26 DIAGNOSIS — M539 Dorsopathy, unspecified: Secondary | ICD-10-CM

## 2022-01-13 ENCOUNTER — Emergency Department: Payer: Medicare HMO

## 2022-01-13 ENCOUNTER — Other Ambulatory Visit: Payer: Self-pay

## 2022-01-13 ENCOUNTER — Emergency Department
Admission: EM | Admit: 2022-01-13 | Discharge: 2022-01-13 | Disposition: A | Discharge status: Home / Self Care | Payer: Medicare HMO | Attending: Emergency Medicine | Admitting: Emergency Medicine

## 2022-01-13 ENCOUNTER — Encounter: Payer: Self-pay | Admitting: Radiology

## 2022-01-13 DIAGNOSIS — R4182 Altered mental status, unspecified: Secondary | ICD-10-CM | POA: Insufficient documentation

## 2022-01-13 DIAGNOSIS — R0600 Dyspnea, unspecified: Secondary | ICD-10-CM | POA: Diagnosis not present

## 2022-01-13 DIAGNOSIS — R0602 Shortness of breath: Secondary | ICD-10-CM | POA: Diagnosis present

## 2022-01-13 LAB — COMPREHENSIVE METABOLIC PANEL
ALT: 19 U/L (ref 0–44)
AST: 24 U/L (ref 15–41)
Albumin: 4.1 g/dL (ref 3.5–5.0)
Alkaline Phosphatase: 61 U/L (ref 38–126)
Anion gap: 8 (ref 5–15)
BUN: 21 mg/dL — ABNORMAL HIGH (ref 6–20)
CO2: 23 mmol/L (ref 22–32)
Calcium: 9.4 mg/dL (ref 8.9–10.3)
Chloride: 107 mmol/L (ref 98–111)
Creatinine, Ser: 1.81 mg/dL — ABNORMAL HIGH (ref 0.61–1.24)
GFR, Estimated: 43 mL/min — ABNORMAL LOW (ref >60)
Glucose, Bld: 103 mg/dL — ABNORMAL HIGH (ref 70–99)
Potassium: 3.9 mmol/L (ref 3.5–5.1)
Sodium: 138 mmol/L (ref 135–145)
Total Bilirubin: 1 mg/dL (ref 0.3–1.2)
Total Protein: 7.2 g/dL (ref 6.5–8.1)

## 2022-01-13 LAB — URINE DRUG SCREEN, QUALITATIVE (ARMC ONLY)
Amphetamines, Ur Screen: NOT DETECTED
Barbiturates, Ur Screen: NOT DETECTED
Benzodiazepine, Ur Scrn: NOT DETECTED
Cannabinoid 50 Ng, Ur ~~LOC~~: NOT DETECTED
Cocaine Metabolite,Ur ~~LOC~~: NOT DETECTED
MDMA (Ecstasy)Ur Screen: NOT DETECTED
Methadone Scn, Ur: NOT DETECTED
Opiate, Ur Screen: NOT DETECTED
Phencyclidine (PCP) Ur S: NOT DETECTED
Tricyclic, Ur Screen: NOT DETECTED

## 2022-01-13 LAB — BLOOD GAS, VENOUS
Acid-Base Excess: 4.2 mmol/L — ABNORMAL HIGH (ref 0.0–2.0)
Bicarbonate: 25.1 mmol/L (ref 20.0–28.0)
O2 Saturation: 40.7 %
Patient temperature: 37
pCO2, Ven: 28 mmHg — ABNORMAL LOW (ref 44–60)
pH, Ven: 7.56 — ABNORMAL HIGH (ref 7.25–7.43)
pO2, Ven: <31 mmHg — CL (ref 32–45)

## 2022-01-13 LAB — CBC WITH DIFFERENTIAL/PLATELET
Abs Immature Granulocytes: 0.05 10*3/uL (ref 0.00–0.07)
Basophils Absolute: 0 10*3/uL (ref 0.0–0.1)
Basophils Relative: 0 %
Eosinophils Absolute: 0 10*3/uL (ref 0.0–0.5)
Eosinophils Relative: 0 %
HCT: 48.5 % (ref 39.0–52.0)
Hemoglobin: 16.7 g/dL (ref 13.0–17.0)
Immature Granulocytes: 1 %
Lymphocytes Relative: 22 %
Lymphs Abs: 2.4 10*3/uL (ref 0.7–4.0)
MCH: 31.2 pg (ref 26.0–34.0)
MCHC: 34.4 g/dL (ref 30.0–36.0)
MCV: 90.7 fL (ref 80.0–100.0)
Monocytes Absolute: 0.8 10*3/uL (ref 0.1–1.0)
Monocytes Relative: 7 %
Neutro Abs: 7.4 10*3/uL (ref 1.7–7.7)
Neutrophils Relative %: 70 %
Platelets: 242 10*3/uL (ref 150–400)
RBC: 5.35 MIL/uL (ref 4.22–5.81)
RDW: 12.1 % (ref 11.5–15.5)
WBC: 10.7 10*3/uL — ABNORMAL HIGH (ref 4.0–10.5)
nRBC: 0 % (ref 0.0–0.2)

## 2022-01-13 LAB — TROPONIN I (HIGH SENSITIVITY)
Troponin I (High Sensitivity): 6 ng/L (ref <18)
Troponin I (High Sensitivity): 7 ng/L (ref <18)

## 2022-01-13 LAB — LACTIC ACID, PLASMA
Lactic Acid, Venous: 2.3 mmol/L (ref 0.5–1.9)
Lactic Acid, Venous: 1.3 mmol/L (ref 0.5–1.9)

## 2022-01-13 LAB — BRAIN NATRIURETIC PEPTIDE: B Natriuretic Peptide: 29.1 pg/mL (ref 0.0–100.0)

## 2022-01-13 LAB — CBG MONITORING, ED: Glucose-Capillary: 93 mg/dL (ref 70–99)

## 2022-01-13 LAB — PROCALCITONIN: Procalcitonin: <0.1 ng/mL

## 2022-01-13 MED ORDER — ACETAMINOPHEN 500 MG PO TABS
1000.0000 mg | ORAL_TABLET | Freq: Once | ORAL | Status: AC
  Administered 2022-01-13: 1000 mg via ORAL
  Filled 2022-01-13: qty 2

## 2022-01-13 MED ORDER — LORAZEPAM 2 MG/ML IJ SOLN
INTRAMUSCULAR | Status: AC
  Filled 2022-01-13: qty 1

## 2022-01-13 MED ORDER — LORAZEPAM 2 MG/ML IJ SOLN: 1.0000 mg | Freq: Once | INTRAMUSCULAR | Status: DC

## 2022-01-13 MED ORDER — SODIUM CHLORIDE 0.9 % IV BOLUS
1000.0000 mL | Freq: Once | INTRAVENOUS | Status: AC
  Administered 2022-01-13: 1000 mL via INTRAVENOUS

## 2022-01-13 MED ORDER — LORAZEPAM 2 MG/ML IJ SOLN
1.0000 mg | Freq: Once | INTRAMUSCULAR | Status: AC
  Administered 2022-01-13: 1 mg via INTRAVENOUS

## 2022-01-13 MED ORDER — NITROGLYCERIN 2 % TD OINT
0.5000 [in_us] | TOPICAL_OINTMENT | Freq: Once | TRANSDERMAL | Status: AC
Start: 2022-01-13 — End: 2022-01-13
  Administered 2022-01-13: 0.5 [in_us] via TOPICAL
  Filled 2022-01-13: qty 1

## 2022-01-13 MED ORDER — IOHEXOL 350 MG/ML SOLN
60.0000 mL | Freq: Once | INTRAVENOUS | Status: AC | PRN
  Administered 2022-01-13: 60 mL via INTRAVENOUS

## 2022-01-13 NOTE — ED Notes (Signed)
Pt now denies L arm pain but states "it is cold"; pt requesting warm blanket for legs.

## 2022-01-13 NOTE — ED Notes (Signed)
Pt using urinal.

## 2022-01-13 NOTE — ED Notes (Signed)
This RN accompanying pt to CT.

## 2022-01-13 NOTE — ED Notes (Signed)
Attempted for 20g at L ac.

## 2022-01-13 NOTE — ED Triage Notes (Signed)
Pt here from [facility] with SOB. Pt states he has been SOB for a few days. Pt also concerned about his blood sugar. Pt very SOB on arrival to triage.

## 2022-01-13 NOTE — ED Notes (Signed)
Pt denies CP but states bad HA.

## 2022-01-13 NOTE — ED Notes (Signed)
EDP Malinda at bedside.  ?

## 2022-01-13 NOTE — ED Notes (Signed)
Pt urinated 175. Sample sent to lab.

## 2022-01-13 NOTE — ED Notes (Signed)
1 set of blood cultures sent to lab. Lactic on ice.

## 2022-01-13 NOTE — ED Provider Notes (Addendum)
HePatient signed out to me at 3 PM.  In brief he is a 56 year old male who presents today with shortness of breath.  Was initially seen by PCP and sent to ED due to concern for possible pulmonary embolism.  His CTA is negative chest x-ray also negative VBG showing respiratory alkalosis initial lactate mildly elevated at 2.3, and he also has a mild leukocytosis to 10 but procalcitonin is negative and the rest of his laboratory work is reassuring including a negative troponin.  Patient received some IV Ativan and on my reassessment he looks well says he feels much improved is eating a sandwich.  He tells me he has had intermittent dyspnea for some time feels short of breath in the middle of the night waking him from sleep.  Says he was evaluated for sleep apnea in the past but not recently.  He endorses intermittent chest pain pressure-like comes and goes nonexertional nonpleuritic.  Plan is to give a liter of fluid repeat the lactate and repeat troponin.  If all reassuring he can be discharged and follow-up with cardiology as an outpatient.  Repeat lactate is negative as is troponin.  Patient is resting comfortably with normal vital signs.  Will refer to outpatient cardiology.  I have placed a referral order.   Rada Hay, MD 01/13/22 1648    Rada Hay, MD 01/13/22 1827

## 2022-01-13 NOTE — ED Provider Notes (Addendum)
[facility] Provider Note    Event Date/Time   First MD Initiated Contact with Patient 01/13/22 1236     (approximate)   History   Shortness of Breath   HPI  Randy Dean is a 56 y.o. male sent in by Dr. Sabra Heck.  Patient reports he has been short of breath for quite some time but has been much worse in the last week or so.  He gets short of breath more with exertion or doing something.  Otherwise he does not get as short of breath.  He has a history of asthma.  He comes in breathing very hard nurse reports he kind of zoned out on her twice.  He seems to be a little out of it with me but is not disoriented.     Physical Exam   Triage Vital Signs: ED Triage Vitals  Enc Vitals Group     BP 01/13/22 1230 (!) 154/92     Pulse Rate 01/13/22 1230 62     Resp 01/13/22 1230 20     Temp 01/13/22 1236 97.7 F (36.5 C)     Temp Source 01/13/22 1236 Oral     SpO2 01/13/22 1230 98 %     Weight 01/13/22 1231 207 lb 14.3 oz (94.3 kg)     Height 01/13/22 1231 6' (1.829 m)     Head Circumference --      Peak Flow --      Pain Score 01/13/22 1230 0     Pain Loc --      Pain Edu? --      Excl. in [place]? --     Most recent vital signs: Vitals:   01/13/22 1500 01/13/22 1600  BP: 119/86 130/80  Pulse: 74 75  Resp: (!) 21 (!) 21  Temp:    SpO2: 96% 93%    General: Awake alert and oriented but just a little off.  Some looks like he is blood sugar is too low.  I am getting a CBG now.  He is not sweaty though. CV:  Good peripheral perfusion.  Heart regular rate and rhythm no audible murmurs Resp:  Normal effort.  Lungs are clear no sign of increased effort but he is breathing very hard and fast. Abd:  No distention.  Nontender Extremities no edema   ED Results / Procedures / Treatments   Labs (all labs ordered are listed, but only abnormal results are displayed) Labs Reviewed  COMPREHENSIVE METABOLIC PANEL - Abnormal; Notable for the following  components:      Result Value   Glucose, Bld 103 (*)    BUN 21 (*)    Creatinine, Ser 1.81 (*)    GFR, Estimated 43 (*)    All other components within normal limits  LACTIC ACID, PLASMA - Abnormal; Notable for the following components:   Lactic Acid, Venous 2.3 (*)    All other components within normal limits  CBC WITH DIFFERENTIAL/PLATELET - Abnormal; Notable for the following components:   WBC 10.7 (*)    All other components within normal limits  BLOOD GAS, VENOUS - Abnormal; Notable for the following components:   pH, Ven 7.56 (*)    pCO2, Ven 28 (*)    pO2, Ven <31 (*)    Acid-Base Excess 4.2 (*)    All other components within normal limits  BRAIN NATRIURETIC PEPTIDE  PROCALCITONIN  LACTIC ACID, PLASMA  SEDIMENTATION RATE  URINE DRUG SCREEN, QUALITATIVE ([facility] ONLY)  CBG  MONITORING, ED  TROPONIN I (HIGH SENSITIVITY)  TROPONIN I (HIGH SENSITIVITY)     EKG  EKG read and interpreted by me shows sinus bradycardia rate of 54 normal axis no acute ST-T wave changes EKG #2 read interpreted by me shows 66 normal axis no acute changes really no change from EKG #1  RADIOLOGY X-ray read by radiology reviewed by me and interpreted by me shows no acute changes   PROCEDURES:  Critical Care performed:   Procedures   MEDICATIONS ORDERED IN ED: Medications  LORazepam (ATIVAN) injection 1 mg (0 mg Intravenous Hold 01/13/22 1456)  nitroGLYCERIN (NITROGLYN) 2 % ointment 0.5 inch (0.5 inches Topical Given 01/13/22 1324)  iohexol (OMNIPAQUE) 350 MG/ML injection 60 mL (60 mLs Intravenous Contrast Given 01/13/22 1425)  LORazepam (ATIVAN) injection 1 mg (1 mg Intravenous Given 01/13/22 1419)  sodium chloride 0.9 % bolus 1,000 mL (1,000 mLs Intravenous New Bag/Given 01/13/22 1618)  acetaminophen (TYLENOL) tablet 1,000 mg (1,000 mg Oral Given 01/13/22 1622)     IMPRESSION / MDM / ASSESSMENT AND PLAN / ED COURSE  I reviewed the triage vital signs and the nursing notes. Insert timestamp  CBG shows a sugar of 93 Patient has several episodes of pain in his left arm nurse reports he is got sweaty with a couple of these.  She is done an EKG but there is nothing that shows there.  Episodes are not very long-lasting.  We will try some Nitropaste to see if that does anything.  ----------------------------------------- 4:45 PM on 01/13/2022 ----------------------------------------- CT angio is negative.  Patient feels somewhat better after the Ativan.  We will repeat some fluids and the lactic acid and troponin and see how he is doing possibly he can be discharged.  I am signing him out to the oncoming physician.  Patient's presentation is most consistent with acute complicated illness / injury requiring diagnostic workup. he patient is on the cardiac monitor to evaluate for evidence of arrhythmia and/or significant heart rate changes.    FINAL CLINICAL IMPRESSION(S) / ED DIAGNOSES   Final diagnoses:  Dyspnea, unspecified type     Rx / DC Orders   ED Discharge Orders     None        Note:  This document was prepared using Dragon voice recognition software and may include unintentional dictation errors.   Nena Polio, MD 01/13/22 1647 I need to add that this gentleman had several spells of his chest pain and arm pain and dyspnea and seeming to be a little off while he was on the monitor and nothing was showing up at the time.  The one EKG was also done during 1 of these pain spells.   Nena Polio, MD 01/13/22 [phone]

## 2022-01-13 NOTE — ED Notes (Signed)
Pt c/o "hard pain" from top of L arm to L hand; states "pinching" sensation. Pt SOB and face slightly diaphoretic. Repeat EKG completed.

## 2022-01-13 NOTE — ED Notes (Signed)
Kidney function 39% per pt and states cannot have contrast on any sort. Pt wanted RN to know this. Pt reports arm is going numb.

## 2022-01-13 NOTE — Discharge Instructions (Addendum)
Your blood work and imaging was all reassuring today.  I would like you to follow-up with cardiology for further assessment for heart disease as a cause of your symptoms.  Please also follow-up with your primary care doctor.

## 2022-01-13 NOTE — ED Notes (Addendum)
See triage note and provider Miller's noted from [facility]; pt in with inc WOB and SOB; pt currently SOB; pt speaking in phrases as gets out of breath when he attempts full sentences. Pt denies CP but states intermittent tightness in chest. A&OX4 currently. Pt has his glasses on. Pt will be talking to this RN and spaceout and this RN has to snap fingers in front of pt to gain pt's attention again. In 2021, pt reports one doctor told him he had an ischemic stroke and another told him it was a TIA.

## 2022-01-13 NOTE — ED Notes (Signed)
EDP Malinda notified of elevated lactic 2.3 in person.

## 2022-01-13 NOTE — ED Notes (Signed)
Pt fluctuates between severe L arm pain/numbness & sweating at face and sitting calmly on stretcher using personal phone.

## 2022-01-13 NOTE — ED Notes (Signed)
EDP Malinda at bedside. Pt reports cold/numb L arm. EDP Malinda states cap refill at L arm good currently.

## 2022-04-22 ENCOUNTER — Emergency Department: Payer: Medicare HMO

## 2022-04-22 ENCOUNTER — Inpatient Hospital Stay: Payer: Medicare HMO

## 2022-04-22 ENCOUNTER — Inpatient Hospital Stay
Admission: EM | Admit: 2022-04-22 | Discharge: 2022-04-24 | DRG: 880 | Disposition: A | Discharge status: Home / Self Care | Payer: Medicare HMO | Attending: Internal Medicine | Admitting: Internal Medicine

## 2022-04-22 DIAGNOSIS — R29810 Facial weakness: Secondary | ICD-10-CM | POA: Diagnosis present

## 2022-04-22 DIAGNOSIS — R7303 Prediabetes: Secondary | ICD-10-CM | POA: Diagnosis present

## 2022-04-22 DIAGNOSIS — R4701 Aphasia: Secondary | ICD-10-CM | POA: Diagnosis present

## 2022-04-22 DIAGNOSIS — H65191 Other acute nonsuppurative otitis media, right ear: Secondary | ICD-10-CM | POA: Diagnosis not present

## 2022-04-22 DIAGNOSIS — R4781 Slurred speech: Secondary | ICD-10-CM | POA: Diagnosis present

## 2022-04-22 DIAGNOSIS — N1832 Chronic kidney disease, stage 3b: Secondary | ICD-10-CM | POA: Diagnosis present

## 2022-04-22 DIAGNOSIS — R29898 Other symptoms and signs involving the musculoskeletal system: Secondary | ICD-10-CM | POA: Diagnosis not present

## 2022-04-22 DIAGNOSIS — Z881 Allergy status to other antibiotic agents status: Secondary | ICD-10-CM

## 2022-04-22 DIAGNOSIS — Z87820 Personal history of traumatic brain injury: Secondary | ICD-10-CM | POA: Diagnosis not present

## 2022-04-22 DIAGNOSIS — Z79899 Other long term (current) drug therapy: Secondary | ICD-10-CM | POA: Diagnosis not present

## 2022-04-22 DIAGNOSIS — I639 Cerebral infarction, unspecified: Secondary | ICD-10-CM | POA: Diagnosis not present

## 2022-04-22 DIAGNOSIS — N400 Enlarged prostate without lower urinary tract symptoms: Secondary | ICD-10-CM | POA: Diagnosis present

## 2022-04-22 DIAGNOSIS — G8929 Other chronic pain: Secondary | ICD-10-CM | POA: Diagnosis present

## 2022-04-22 DIAGNOSIS — N183 Chronic kidney disease, stage 3 unspecified: Secondary | ICD-10-CM

## 2022-04-22 DIAGNOSIS — E785 Hyperlipidemia, unspecified: Secondary | ICD-10-CM | POA: Diagnosis present

## 2022-04-22 DIAGNOSIS — Z7951 Long term (current) use of inhaled steroids: Secondary | ICD-10-CM | POA: Diagnosis not present

## 2022-04-22 DIAGNOSIS — G8191 Hemiplegia, unspecified affecting right dominant side: Secondary | ICD-10-CM | POA: Diagnosis present

## 2022-04-22 DIAGNOSIS — I6389 Other cerebral infarction: Secondary | ICD-10-CM | POA: Diagnosis not present

## 2022-04-22 DIAGNOSIS — F419 Anxiety disorder, unspecified: Secondary | ICD-10-CM | POA: Diagnosis present

## 2022-04-22 DIAGNOSIS — J45909 Unspecified asthma, uncomplicated: Secondary | ICD-10-CM | POA: Diagnosis present

## 2022-04-22 DIAGNOSIS — Z8249 Family history of ischemic heart disease and other diseases of the circulatory system: Secondary | ICD-10-CM | POA: Diagnosis not present

## 2022-04-22 DIAGNOSIS — G4486 Cervicogenic headache: Secondary | ICD-10-CM | POA: Diagnosis present

## 2022-04-22 DIAGNOSIS — Z833 Family history of diabetes mellitus: Secondary | ICD-10-CM

## 2022-04-22 DIAGNOSIS — Z8673 Personal history of transient ischemic attack (TIA), and cerebral infarction without residual deficits: Secondary | ICD-10-CM

## 2022-04-22 DIAGNOSIS — H6690 Otitis media, unspecified, unspecified ear: Secondary | ICD-10-CM | POA: Diagnosis present

## 2022-04-22 DIAGNOSIS — E876 Hypokalemia: Secondary | ICD-10-CM | POA: Diagnosis present

## 2022-04-22 DIAGNOSIS — F32A Depression, unspecified: Secondary | ICD-10-CM | POA: Diagnosis present

## 2022-04-22 DIAGNOSIS — F40240 Claustrophobia: Secondary | ICD-10-CM | POA: Diagnosis present

## 2022-04-22 DIAGNOSIS — R739 Hyperglycemia, unspecified: Secondary | ICD-10-CM | POA: Diagnosis present

## 2022-04-22 DIAGNOSIS — Z888 Allergy status to other drugs, medicaments and biological substances status: Secondary | ICD-10-CM

## 2022-04-22 DIAGNOSIS — R471 Dysarthria and anarthria: Secondary | ICD-10-CM | POA: Diagnosis present

## 2022-04-22 DIAGNOSIS — Z87891 Personal history of nicotine dependence: Secondary | ICD-10-CM | POA: Diagnosis not present

## 2022-04-22 DIAGNOSIS — F324 Major depressive disorder, single episode, in partial remission: Secondary | ICD-10-CM | POA: Diagnosis not present

## 2022-04-22 DIAGNOSIS — R2990 Unspecified symptoms and signs involving the nervous system: Secondary | ICD-10-CM | POA: Diagnosis not present

## 2022-04-22 DIAGNOSIS — F4312 Post-traumatic stress disorder, chronic: Secondary | ICD-10-CM | POA: Diagnosis present

## 2022-04-22 DIAGNOSIS — F447 Conversion disorder with mixed symptom presentation: Secondary | ICD-10-CM | POA: Diagnosis present

## 2022-04-22 DIAGNOSIS — K219 Gastro-esophageal reflux disease without esophagitis: Secondary | ICD-10-CM | POA: Diagnosis present

## 2022-04-22 LAB — COMPREHENSIVE METABOLIC PANEL
ALT: 23 U/L (ref 0–44)
AST: 24 U/L (ref 15–41)
Albumin: 3.7 g/dL (ref 3.5–5.0)
Alkaline Phosphatase: 66 U/L (ref 38–126)
Anion gap: 8 (ref 5–15)
BUN: 18 mg/dL (ref 6–20)
CO2: 23 mmol/L (ref 22–32)
Calcium: 9.4 mg/dL (ref 8.9–10.3)
Chloride: 105 mmol/L (ref 98–111)
Creatinine, Ser: 1.81 mg/dL — ABNORMAL HIGH (ref 0.61–1.24)
GFR, Estimated: 43 mL/min — ABNORMAL LOW (ref >60)
Glucose, Bld: 173 mg/dL — ABNORMAL HIGH (ref 70–99)
Potassium: 3.2 mmol/L — ABNORMAL LOW (ref 3.5–5.1)
Sodium: 136 mmol/L (ref 135–145)
Total Bilirubin: 0.9 mg/dL (ref 0.3–1.2)
Total Protein: 7.1 g/dL (ref 6.5–8.1)

## 2022-04-22 LAB — URINALYSIS, COMPLETE (UACMP) WITH MICROSCOPIC
Bacteria, UA: NONE SEEN
Bilirubin Urine: NEGATIVE
Glucose, UA: NEGATIVE mg/dL
Hgb urine dipstick: NEGATIVE
Ketones, ur: NEGATIVE mg/dL
Leukocytes,Ua: NEGATIVE
Nitrite: NEGATIVE
Protein, ur: 30 mg/dL — AB
Specific Gravity, Urine: 1.009 (ref 1.005–1.030)
Squamous Epithelial / HPF: NONE SEEN (ref 0–5)
pH: 6 (ref 5.0–8.0)

## 2022-04-22 LAB — DIFFERENTIAL
Abs Immature Granulocytes: 0.02 10*3/uL (ref 0.00–0.07)
Basophils Absolute: 0 10*3/uL (ref 0.0–0.1)
Basophils Relative: 0 %
Eosinophils Absolute: 0 10*3/uL (ref 0.0–0.5)
Eosinophils Relative: 1 %
Immature Granulocytes: 0 %
Lymphocytes Relative: 17 %
Lymphs Abs: 1.3 10*3/uL (ref 0.7–4.0)
Monocytes Absolute: 0.5 10*3/uL (ref 0.1–1.0)
Monocytes Relative: 6 %
Neutro Abs: 5.5 10*3/uL (ref 1.7–7.7)
Neutrophils Relative %: 76 %

## 2022-04-22 LAB — CBC
HCT: 45.1 % (ref 39.0–52.0)
Hemoglobin: 16 g/dL (ref 13.0–17.0)
MCH: 31.6 pg (ref 26.0–34.0)
MCHC: 35.5 g/dL (ref 30.0–36.0)
MCV: 89.1 fL (ref 80.0–100.0)
Platelets: 209 10*3/uL (ref 150–400)
RBC: 5.06 MIL/uL (ref 4.22–5.81)
RDW: 12.5 % (ref 11.5–15.5)
WBC: 7.4 10*3/uL (ref 4.0–10.5)
nRBC: 0 % (ref 0.0–0.2)

## 2022-04-22 LAB — URINE DRUG SCREEN, QUALITATIVE (ARMC ONLY)
Amphetamines, Ur Screen: NOT DETECTED
Barbiturates, Ur Screen: NOT DETECTED
Benzodiazepine, Ur Scrn: NOT DETECTED
Cannabinoid 50 Ng, Ur ~~LOC~~: NOT DETECTED
Cocaine Metabolite,Ur ~~LOC~~: NOT DETECTED
MDMA (Ecstasy)Ur Screen: NOT DETECTED
Methadone Scn, Ur: NOT DETECTED
Opiate, Ur Screen: NOT DETECTED
Phencyclidine (PCP) Ur S: NOT DETECTED
Tricyclic, Ur Screen: NOT DETECTED

## 2022-04-22 LAB — ETHANOL: Alcohol, Ethyl (B): <10 mg/dL (ref <10)

## 2022-04-22 LAB — APTT: aPTT: 33 seconds (ref 24–36)

## 2022-04-22 LAB — PROTIME-INR
INR: 1 (ref 0.8–1.2)
Prothrombin Time: 13.2 seconds (ref 11.4–15.2)

## 2022-04-22 MED ORDER — ACETAMINOPHEN 650 MG RE SUPP: 650.0000 mg | RECTAL | Status: DC | PRN

## 2022-04-22 MED ORDER — TENECTEPLASE FOR STROKE
0.2500 mg/kg | PACK | Freq: Once | INTRAVENOUS | Status: AC
  Administered 2022-04-22: 25 mg via INTRAVENOUS

## 2022-04-22 MED ORDER — SENNOSIDES-DOCUSATE SODIUM 8.6-50 MG PO TABS: 1.0000 | ORAL_TABLET | Freq: Every evening | ORAL | Status: DC | PRN

## 2022-04-22 MED ORDER — LORAZEPAM 2 MG/ML IJ SOLN
INTRAMUSCULAR | Status: AC
  Administered 2022-04-22: 1 mg via INTRAVENOUS
  Filled 2022-04-22: qty 2

## 2022-04-22 MED ORDER — SODIUM CHLORIDE 0.9 % IV SOLN: INTRAVENOUS | Status: DC

## 2022-04-22 MED ORDER — POTASSIUM CHLORIDE 10 MEQ/100ML IV SOLN
10.0000 meq | INTRAVENOUS | Status: AC
  Administered 2022-04-22 (×3): 10 meq via INTRAVENOUS
  Filled 2022-04-22 (×2): qty 100

## 2022-04-22 MED ORDER — LORAZEPAM 2 MG/ML IJ SOLN: 1.0000 mg | Freq: Once | INTRAMUSCULAR | Status: AC

## 2022-04-22 MED ORDER — SODIUM CHLORIDE 0.9% FLUSH
3.0000 mL | Freq: Once | INTRAVENOUS | Status: AC
  Administered 2022-04-22: 3 mL via INTRAVENOUS

## 2022-04-22 MED ORDER — LABETALOL HCL 5 MG/ML IV SOLN: 10.0000 mg | INTRAVENOUS | Status: DC | PRN

## 2022-04-22 MED ORDER — STROKE: EARLY STAGES OF RECOVERY BOOK: Freq: Once | Status: AC

## 2022-04-22 MED ORDER — ACETAMINOPHEN 160 MG/5ML PO SOLN: 650.0000 mg | ORAL | Status: DC | PRN

## 2022-04-22 MED ORDER — ACETAMINOPHEN 325 MG PO TABS
650.0000 mg | ORAL_TABLET | ORAL | Status: DC | PRN
  Administered 2022-04-22 – 2022-04-23 (×2): 650 mg via ORAL
  Filled 2022-04-22 (×2): qty 2

## 2022-04-22 NOTE — Consult Note (Signed)
[place] for Electrolyte Monitoring and Replacement   Recent Labs: Potassium (mmol/L)  Date Value  04/22/2022 3.2 (L)   Magnesium (mg/dL)  Date Value  04/14/2018 2.4   Calcium (mg/dL)  Date Value  04/22/2022 9.4   Albumin (g/dL)  Date Value  04/22/2022 3.7  04/30/2020 3.9   Phosphorus (mg/dL)  Date Value  04/14/2018 4.0   Sodium (mmol/L)  Date Value  04/22/2022 136  04/30/2020 139   Assessment: Patient is a 56 y/o M with medical history including TIA, diverticulitis, prediabetes, TBI, anxiety / depression, HLD who presented to the ED 10/25 as code stroke and received TNK. Patient disposition is ICU.   Diet: NPO MIVF: NS at 75 cc/hr  Goal of Therapy:  Electrolytes within normal limits  Plan:  --K 3.2, will give Kcl 10 mEq IV x 3 doses; consider adding to MIVF if needed --Follow-up swallow evaluation --Follow-up electrolytes with AM labs tomorrow  Benita Gutter 04/22/2022 6:29 PM

## 2022-04-22 NOTE — Code Documentation (Addendum)
Stroke Response Nurse Documentation Code Documentation  Randy Dean is a 56 y.o. male arriving to [facility] via [facility] on 04/22/2022 with past medical hx of stroke, CKD, anxiety, GERD, prediabetes, anxiety, headaches. On No antithrombotic. Code stroke was activated by EMS.   Patient from home where he was LKW at 1330 and now complaining of right sided weakness. Patient reports waking up in normal state, went about his normal activities, just finished lunch and sat down when at 1330 he had acute onset right sided weakness. EMS called by family.  Stroke team at the bedside on patient arrival. Labs drawn and patient cleared for CT by Dr. Charna Archer. Patient to CT with team. NIHSS 6, see documentation for details and code stroke times. Patient with right facial droop on exam. The following imaging was completed:  CT Head. 1 mg ativan given to patient by primary RN in CT for anxiety (see MAR.) Patient brought back to room 15 and given TNK at 1633 (see MAR) after MD Bhagat discussed with patient's wife.   Care Plan: Q37mn NIHSS +VS x 2 h, Q344m NIHSS + VS x 6 h, Q1h NIHSS + VS x 16 per order.   Bedside handoff with ED RN Elana.    KeCharise CarwinStroke Response RN

## 2022-04-22 NOTE — ED Triage Notes (Signed)
Pt. To ED via EMS from home for headache and slurred speech  since 130 today. Pt. Has hx. Of same and received TNK. Pt. Is alert and oriented. Pt to CT from EMS bay. Dr. Curly Shores to EMS bay to assess prior to CT.

## 2022-04-22 NOTE — Progress Notes (Signed)
Telestroke RN Note  2919: Code stroke activated via cart for EMS pre alert. Cart was left in the patient's room. Did not see patient arrive to ED.   1600: Patient in CT being assessed by Dr. Curly Shores. [place] 1330 & MRS 0.   1617: Patient back in room from CT. Dr. Curly Shores at bedside talking to the wife of the patient.   1633: TNK administered by bedside RN.  1705: Signed off cart.

## 2022-04-22 NOTE — Consult Note (Signed)
Neurology Consultation Reason for Consult: Code Stroke Requesting Physician: Valora Piccolo  CC: Right sided weakness and slurred speech    History is obtained from: Wife, chart review and patient   HPI: Randy Dean is a 56 y.o. male with a past medical history significant for stroke versus TIA versus functional episode versus seizure, PTSD, chronic cervicogenic headaches, anxiety, CKD per his report secondary to NSAID overuse  He reports that he was in his usual state of health except he woke up with some worsening of his chronic neck pain in the morning.  At around noon his headache was worsening and he had made himself lunch.  After lunch at about 1:30 PM he began to have right-sided weakness which he reports is not typical for him.  He also had some stuttering and variable speech and some confusion/numbness responding to commands.  He called his brother and father who subsequently called EMS and he was transported to [facility] as a code stroke  Notably on 07/22/2019 he presented to [facility] with symptoms of right-sided weakness, dysarthria, expressive and receptive aphasia, with claustrophobia requiring sedation even for head CT, and with tPA infusion stopped due to concern for anaphylaxis.  This episode is similar to the episode today other than that episode was not associated with a severe headache and was associated with emotional lability including crying uncontrollably and confusion.  On last outpatient neurology evaluation 11/21/2021 with Dr. Manuella Ghazi, he was noted to have some mild left lower extremity dorsiflexion weakness 4+/5 for which MRI C and T-spine were planned but will be set up under sedation due to patient's inability to tolerate the scans, and he was noted to have chronic right-sided facial weakness  LKW: 1:30 PM Thrombolytic given?: Yes  Checklist of contraindications was reviewed and negative. Risks, benefits and alternatives were discussed   Delays secondary to patient's significant  claustrophobia requiring sedation even for head CT as well as need to obtain consent from wife via phone secondary to his delayed speech and confusion  Specifically, I had a discussion with wife about my concern that this was most consistent with a functional neurological disorder given distractible features on examination and prior presentation with similar symptoms and negative MRI IA performed?: No, exam not consistent with LVO Premorbid modified rankin scale: 2 from prior injury   ROS: Unable to obtain due to altered mental status.   Past Medical History:  Diagnosis Date   Abnormality of gait 10/30/2015   Allergic rhinitis    Anxiety    Arthritis    Asthma    well controlled   BPH (benign prostatic hyperplasia)    Chronic headaches    partially sinus/partially brain injury (per pt)   Claustrophobia    Complication of anesthesia    hard to wake up after 1 surgery but pt had had 3surgeries close together   Dyspnea    GERD (gastroesophageal reflux disease)    Head trauma 2010   has caused anxiety, memory issues and neck problems   Headache 10/30/2015   Right temporal    Ischemic stroke ([place]) 06/2019   Kidney damage    due to years of taking meloxicam after head injury   Neck stiffness    metal plate.  mild limitations of side to side and up and dowm movement   Prediabetes    PTSD (post-traumatic stress disorder)    RAD (reactive airway disease)    Spinal cord compression (HCC)    -cervical from brain trauma   Stage  3b chronic kidney disease ([place])    Stroke ([place])    TIA x3- some residual right side weakness   Vertigo    Weakness of right side of body    from TIAs   Past Surgical History:  Procedure Laterality Date   BACK SURGERY  1996   spinal surgery and neck 2x   COLON SURGERY  05/26/2021   due to Diverticulitis   COLONOSCOPY WITH PROPOFOL N/A 03/02/2019   Procedure: COLONOSCOPY WITH PROPOFOL;  Surgeon: Benjamine Sprague, DO;  Location: [facility] ENDOSCOPY;  Service:  General;  Laterality: N/A;   DIAGNOSTIC LAPAROSCOPY     ENDOSCOPIC TURBINATE REDUCTION Bilateral 08/22/2015   Procedure: ENDOSCOPIC TURBINATE REDUCTION;  Surgeon: Margaretha Sheffield, MD;  Location: [place];  Service: ENT;  Laterality: Bilateral;   EYE MUSCLE SURGERY Left    Due to conential left amblyopia x2   FINGER FRACTURE SURGERY Right    index finger   INSERTION OF MESH  10/24/2021   Procedure: INSERTION OF MESH;  Surgeon: Benjamine Sprague, DO;  Location: [facility] ORS;  Service: General;;   NASAL SINUS SURGERY  02/28/2015   NECK SURGERY     x3-metal plate in neck   SHOULDER SURGERY Right 1990   Current Outpatient Medications  Medication Instructions   acetaminophen (TYLENOL) 1,300 mg, Oral, Every 8 hours PRN   albuterol (VENTOLIN HFA) 108 (90 Base) MCG/ACT inhaler 2 puffs, Inhalation, Every 6 hours PRN   amphetamine-dextroamphetamine (ADDERALL XR) 10 MG 24 hr capsule 10 mg, Oral, Every morning   busPIRone (BUSPAR) 15 mg, Oral, At bedtime PRN   dexlansoprazole (DEXILANT) 60 MG capsule Take 1 capsule (60 mg total) by mouth daily.   famotidine (PEPCID) 40 mg, Oral, Daily   fexofenadine (ALLEGRA) 180 mg, Oral, Daily   fluticasone (FLONASE) 50 MCG/ACT nasal spray 2 sprays, Each Nare, 2 times daily   Fluticasone Propionate, Inhal, (FLOVENT DISKUS) 100 MCG/ACT AEPB 1 puff, Inhalation, 2 times daily   hydrochlorothiazide (HYDRODIURIL) 25 mg, Oral, Daily PRN   lansoprazole (PREVACID) 30 mg, Daily   Nurtec 75 mg, Oral, Daily PRN   omeprazole (PRILOSEC) 40 mg, Oral, Daily   phenylephrine (SUDAFED PE) 10 mg, Oral, Every 6 hours PRN   sertraline (ZOLOFT) 50 mg, Oral, Daily   traMADol (ULTRAM) 50 mg, Oral, Every 8 hours PRN     Family History  Problem Relation Age of Onset   Fibromyalgia Mother    Heart disease Father    Tremor Father    Diabetes Maternal Grandmother    Hypertension Maternal Grandmother    Hypertension Maternal Grandfather    Diabetes Paternal Grandmother     Hypertension Paternal Grandmother    Hypertension Paternal Grandfather     Social History:  reports that he quit smoking about 37 years ago. His smoking use included cigarettes. He has a 1.50 pack-year smoking history. He quit smokeless tobacco use about 22 months ago. He reports that he does not drink alcohol and does not use drugs.   Exam: Current vital signs: BP 132/81   Pulse 79   Temp 98.2 F (36.8 C) (Oral)   Resp (!) 22   Wt 100.6 kg   SpO2 96%   BMI 30.08 kg/m  Vital signs in last 24 hours: Temp:  [98.2 F (36.8 C)] 98.2 F (36.8 C) (10/25 1629) Pulse Rate:  [79-90] 79 (10/25 1635) Resp:  [16-30] 22 (10/25 1635) BP: (132-160)/(77-88) 132/81 (10/25 1635) SpO2:  [93 %-97 %] 96 % (10/25 1635) Weight:  [100.6 kg]  100.6 kg (10/25 1626)   Physical Exam  Constitutional: Appears well-developed and well-nourished.  Psych: Affect highly anxious Eyes: No scleral injection HENT: No oropharyngeal obstruction.  MSK: no joint deformities.  Cardiovascular: Normal rate and regular rhythm. Perfusing extremities well Respiratory: Effort normal, non-labored breathing GI: Soft.  No distension. There is no tenderness.  Skin: Warm dry and intact visible skin  Neuro: Mental Status: Patient is awake, alert, oriented to person, place, month, year, and situation. He is able to give some history but limited by attention/concentration Reported glove as hand, had some difficulty naming fingers and did not correctly repeat complex sentences.  Some stuttering speech.  These deficits were variable and waxing and waning Cranial Nerves: II: Visual Fields are full. Pupils are equal, round, and reactive to light.   III,IV, VI: EOMI without ptosis or diploplia.  V: Facial sensation is reduced to pinprick in right V1 and V2 and did not split the midline VII: Facial movement is notable for a right facial droop (on later review noted to be chronic and present on prior neurology evaluations) VIII:  hearing is intact to voice X: Uvula elevates symmetrically XI: Shoulder shrug is symmetric. XII: tongue is midline without atrophy or fasciculations.  Motor: Motor examination is very distractible and variable.  He has variable tremor of the bilateral upper extremities and variable drift of the right upper extremity in particular.  He winces with neck pain with attempt to pronate his right upper extremity limiting testing of pronator drift.  When moving himself spontaneously, for example to relieve back discomfort, he moves his right side much more spontaneously and equally to his left compared to during formal testing Sensory: He reports reduced sensation in the right arm and leg Cerebellar: FNF and HKS are intact bilaterally Gait:  Deferred   NIHSS total 11 --> 6 Score breakdown: One-point for right facial droop, one-point for right upper extremity weakness, 3 points for left lower extremity and left right lower extremity weakness initially (improving to no left extremity weakness and 1 right lower extremity weakness), one-point for sensation loss in the right upper face and arm and leg, one-point for mild aphasia, one-point for mild dysarthria Performed at time of head CT  I have reviewed labs in epic and the results pertinent to this consultation are:  Basic Metabolic Panel: Recent Labs  Lab 04/22/22 1604  NA 136  K 3.2*  CL 105  CO2 23  GLUCOSE 173*  BUN 18  CREATININE 1.81*  CALCIUM 9.4    CBC: Recent Labs  Lab 04/22/22 1604  WBC 7.4  NEUTROABS 5.5  HGB 16.0  HCT 45.1  MCV 89.1  PLT 209    Coagulation Studies: Recent Labs    04/22/22 1604  LABPROT 13.2  INR 1.0      I have reviewed the images obtained: Head CT personally reviewed, agree with radiology no acute intracranial process.  Feel that the right ICA terminus finding is artifactual given it does not correlate with his symptoms 1. No hemorrhage or CT evidence of an infarct. Aspects is 10. 2.  Possible hyperdensity at the right ICA terminus, which could be artifactual but could also be seen in the setting of acute thrombus.   Impression: Patient with minimal vascular risk factors (possible prior stroke versus functional neurological disorder, obesity, minimal prior smoking), presenting with most likely functional neurological disorder versus possible stroke.  After extended discussion with wife, she reports that she was told his prior presentation was ischemic, she understands  the risks and benefits and alternatives to thrombolytic treatment, and that the patient would want TNK.  Therefore this medication was administered  Recommendations:  # Stroke-like episode, most likely complex headache versus functional neurological disorder, but cannot rule out stroke at this time given patient will not tolerate nonsedated MRI - HgbA1c, fasting lipid panel - MRI brain and cervical spine if able (if patient will require general anesthesia this can be completed outpatient) - Carotid duplex, patient declines CT contrast due to his kidney function - Frequent neuro checks - Echocardiogram - Hold antiplatelet agents pending stability scan - Risk factor modification - Telemetry monitoring; 30 day event monitor on discharge if no arrythmias captured  - Blood pressure goal   - Post tPA for 24  hours < 180/105 - PT consult, OT consult, Speech consult, unless patient is back to baseline - Neurology will follow  [place] [phone] [facility] coverage for [facility] is from 8 AM to 4 AM in-house and 4 PM to 8 PM by telephone/video. 8 PM to 8 AM emergent questions or overnight urgent questions should be addressed to Teleneurology On-call or [facility] neurohospitalist; contact information can be found on AMION   Total critical care time: 45 minutes   Critical care time was exclusive of separately billable procedures and treating other patients.    Critical care was necessary to treat or prevent imminent or life-threatening deterioration, treatment with thrombolytic for potential acute neurological change   Critical care was time spent personally by me on the following activities: development of treatment plan with patient and/or surrogate as well as nursing, discussions with consultants/primary team, evaluation of patient's response to treatment, examination of patient, obtaining history from patient or surrogate, ordering and performing treatments and interventions, ordering and review of laboratory studies, ordering and review of radiographic studies, and re-evaluation of patient's condition as needed, as documented above.

## 2022-04-22 NOTE — ED Notes (Addendum)
Darlyn Chamber NP, and Dr. Carmie Kanner at bedside, assessing pt.

## 2022-04-22 NOTE — Progress Notes (Signed)
CODE STROKE- PHARMACY COMMUNICATION   Time CODE STROKE called/page received:1550  Time response to CODE STROKE was made (in person or via phone): 1610  Time Stroke Kit retrieved from [place] (only if needed):1600  Name of Provider/Nurse contacted: Dr. Curly Shores  Past Medical History:  Diagnosis Date   Abnormality of gait 10/30/2015   Allergic rhinitis    Anxiety    Arthritis    Asthma    well controlled   BPH (benign prostatic hyperplasia)    Chronic headaches    partially sinus/partially brain injury (per pt)   Claustrophobia    Complication of anesthesia    hard to wake up after 1 surgery but pt had had 3surgeries close together   Dyspnea    GERD (gastroesophageal reflux disease)    Head trauma 2010   has caused anxiety, memory issues and neck problems   Headache 10/30/2015   Right temporal    Ischemic stroke ([place]) 06/2019   Kidney damage    due to years of taking meloxicam after head injury   Neck stiffness    metal plate.  mild limitations of side to side and up and dowm movement   Prediabetes    PTSD (post-traumatic stress disorder)    RAD (reactive airway disease)    Spinal cord compression (HCC)    -cervical from brain trauma   Stage 3b chronic kidney disease (HCC)    Stroke (HCC)    TIA x3- some residual right side weakness   Vertigo    Weakness of right side of body    from TIAs   Prior to Admission medications   Medication Sig Start Date End Date Taking? Authorizing Provider  acetaminophen (TYLENOL) 650 MG CR tablet Take 1,300 mg by mouth every 8 (eight) hours as needed for pain.    [provider]  albuterol (VENTOLIN HFA) 108 (90 Base) MCG/ACT inhaler Inhale 2 puffs into the lungs every 6 (six) hours as needed for wheezing or shortness of breath. 09/16/20   Lavonia Drafts, MD  amphetamine-dextroamphetamine (ADDERALL XR) 10 MG 24 hr capsule Take 10 mg by mouth every morning. 01/07/22   [provider]  busPIRone (BUSPAR) 15 MG tablet Take 1  tablet (15 mg total) by mouth at bedtime as needed (anxiety). Patient taking differently: Take 15 mg by mouth at bedtime. 05/29/21   Lysle Pearl, Isami, DO  dexlansoprazole (DEXILANT) 60 MG capsule Take 1 capsule (60 mg total) by mouth daily. 07/25/20   Jerrol Banana., MD  famotidine (PEPCID) 40 MG tablet Take 1 tablet (40 mg total) by mouth daily. Patient taking differently: Take 40 mg by mouth at bedtime. 09/30/21   Jerrol Banana., MD  fexofenadine (ALLEGRA) 180 MG tablet Take 180 mg by mouth daily.    [provider]  fluticasone (FLONASE) 50 MCG/ACT nasal spray Place 2 sprays into both nostrils 2 (two) times daily. 07/25/20   Jerrol Banana., MD  Fluticasone Propionate, Inhal, (FLOVENT DISKUS) 100 MCG/ACT AEPB Inhale 1 puff into the lungs 2 (two) times daily.    [provider]  hydrochlorothiazide (HYDRODIURIL) 25 MG tablet Take 25 mg by mouth daily as needed (swelling). 10/23/20 10/23/21  [provider]  lansoprazole (PREVACID) 30 MG capsule Take 30 mg by mouth daily. 01/07/22   [provider]  omeprazole (PRILOSEC) 40 MG capsule Take 40 mg by mouth daily. 03/24/22   [provider]  phenylephrine (SUDAFED PE) 10 MG TABS tablet Take 10 mg by mouth every  6 (six) hours as needed (congestion).    [provider]  Rimegepant Sulfate (NURTEC) 75 MG TBDP Take 75 mg by mouth daily as needed (migraine). 05/29/21   Lysle Pearl, Isami, DO  sertraline (ZOLOFT) 50 MG tablet Take 50 mg by mouth daily. 02/17/22   [provider]  traMADol (ULTRAM) 50 MG tablet Take 1 tablet (50 mg total) by mouth every 8 (eight) hours as needed for severe pain. 10/24/21   Benjamine Sprague, DO    Wynelle Cleveland ,PharmD Clinical Pharmacist  04/22/2022  4:38 PM

## 2022-04-22 NOTE — H&P (Signed)
NAME:  Randy Dean, MRN:  [id number], DOB:  07-19-1965, LOS: 0 ADMISSION DATE:  04/22/2022, CONSULTATION DATE:  04/22/2022 REFERRING MD:  Dr. Cheri Fowler, CHIEF COMPLAINT:  Headache, neck pain, Right sided weakness, slurred speech  Brief Pt Description / Synopsis:  56 y.o male admitted with suspected CVA status post TNK.  History of Present Illness:  DONSHAY Dean is a 56 y.o. male with a past medical history significant for stroke versus TIA versus functional episode versus seizure, cervical injury s/p instrumentation, PTSD, chronic cervicogenic headaches, anxiety, and CKD stage IIIb who presents to [facility] ED on 04/22/22 due to complaints of severe headache, neck pain, right sided weakness, and slurred speech.  Pt reports he has chronic headache and neck pain, unchanged from baseline prior to going to bed last night.  Upon waking this morning, he had the "worst headache I've ever head, different than other headaches" along with persistent neck pain.  At around 13:30 when he was going to get into the chair, he had difficultly ambulating due to right sided weakness.  He also endorsed right sided facial numbness, slurred speech, and some blurred vision on the right side.  He denies any chest pain, palpitations, shortness of breath, abdominal pain, nausea, vomiting, diarrhea, dysuria, paresthesias, fever, or chills.  Of note on 07/22/2019 he presented to [facility] with symptoms of right-sided weakness, dysarthria, expressive and receptive aphasia, with claustrophobia requiring sedation even for head CT, and with tPA infusion stopped due to concern for anaphylaxis.  This episode is similar to the episode today other than that episode was not associated with a severe headache and was associated with emotional lability including crying uncontrollably and confusion.  Last outpatient neurology evaluation 11/21/2021 with Dr. Manuella Ghazi, he was noted to have some mild left lower extremity dorsiflexion weakness 4+/5 for  which MRI C and T-spine were planned but will be set up under sedation due to patient's inability to tolerate the scans, and he was noted to have chronic right-sided facial weakness.  ED Course: Initial Vital Signs: 98.2 F orally, BP 160/88, pulse 90, RR 16, SpO2 97% on room air Significant Labs: potassium 3.2, glucose 173, BUN 18, Creatinine 1.81, ethyl alcohol <10 Imaging CT Head w/o contrast>>IMPRESSION: 1. No hemorrhage or CT evidence of an infarct.  Aspects is 10. 2. Possible hyperdensity at the right ICA terminus, which could be artifactual but could also be seen in the setting of acute thrombus. Medications Administered: TNK @ 16:45, Ativan 1 mg (required to obtain CT due to anxiety)  Code Stroke was activated, pt was evaluated by Neurology, and decision was made to administer TNK.  PCCM is asked to admit for further workup and treatment.   Please see "significant hospital events" section below for full detailed hospital course.  Pertinent  Medical History   Past Medical History:  Diagnosis Date   Abnormality of gait 10/30/2015   Allergic rhinitis    Anxiety    Arthritis    Asthma    well controlled   BPH (benign prostatic hyperplasia)    Chronic headaches    partially sinus/partially brain injury (per pt)   Claustrophobia    Complication of anesthesia    hard to wake up after 1 surgery but pt had had 3surgeries close together   Dyspnea    GERD (gastroesophageal reflux disease)    Head trauma 2010   has caused anxiety, memory issues and neck problems   Headache 10/30/2015   Right temporal    Ischemic stroke (  [place]) 06/2019   Kidney damage    due to years of taking meloxicam after head injury   Neck stiffness    metal plate.  mild limitations of side to side and up and dowm movement   Prediabetes    PTSD (post-traumatic stress disorder)    RAD (reactive airway disease)    Spinal cord compression (HCC)    -cervical from brain trauma   Stage 3b chronic kidney disease  ([place])    Stroke (HCC)    TIA x3- some residual right side weakness   Vertigo    Weakness of right side of body    from TIAs    Micro Data:  N/A  Antimicrobials:  N/A  Significant Hospital Events: Including procedures, antibiotic start and stop dates in addition to other pertinent events   10/25: Code stroke in ED, received TNK.  Neurology consulted.  PCCM asked to admit to ICU  Interim History / Subjective:  -Code stroke initiated in ED, evaluated by Neurology, s/p TNK @ 16:45 -Awake and alert, protecting his airway, hemodynamically stable (SBP 130's) -With mild right sided weakness and tremor -Complains of headache and neck pain   Objective   Blood pressure 128/65, pulse 66, temperature 98.2 F (36.8 C), temperature source Oral, resp. rate (!) 31, weight 100.6 kg, SpO2 100 %.        Intake/Output Summary (Last 24 hours) at 04/22/2022 1806 Last data filed at 04/22/2022 1633 Gross per 24 hour  Intake 3 ml  Output --  Net 3 ml   Filed Weights   04/22/22 1626 04/22/22 1630  Weight: 100.6 kg 100.6 kg    Examination: General: Acute on chronically ill-appearing male, laying in bed, on 2 L nasal cannula, no acute distress HENT: Atraumatic, normocephalic, neck supple, no JVD Lungs: Clear breath sounds throughout, even, nonlabored Cardiovascular: Regular rate and rhythm, S1-S2, no murmurs, some gallops Abdomen: Obese, soft, nontender, nondistended, no guarding rebound tenderness, bowel sound positive x4 Extremities: No deformities, no edema, no cyanosis, good peripheral circulation Neuro: Awake and alert, oriented x4, moves all extremities to command, right side mildly weaker than left, sensation intact, no paresthesias GU: External male catheter in place draining yellow urine  Resolved Hospital Problem list     Assessment & Plan:   Suspected acute CVA s/p TNK vs. ? Functional neurological disorder vs ? complex headache PMHx: CVA vs TIA,  -ICU monitoring -Frequent  Neuro checks per TNK protocol -Neurology following, appreciate input -Strict BP control: Goal BP < 180/105 for 24 hrs s/p TNK ~ prn Labetalol, Cleviprex gtt if needed -No antiplatelets or anticoagulants for 24 hrs s/p TNK -Check fasting lipid panel & Hgb A1c -Obtain Carotid duplex (pt declines CT with contrast due to CKD) -MRI Brain and cervical spine if able (wife reports he may require heavy sedation) -Obtain repeat Head CT with any worsening of Neuro exam -Echocardiogram pending -PT/OT/Speech consults  CKD Stage IIIb Mild Hypokalemia PMHx: BPH -Monitor I&O's / urinary output -Follow BMP -Ensure adequate renal perfusion -Avoid nephrotoxic agents as able -Replace electrolytes as indicated, pharmacy following -Gentle IV fluids  Hyperglycemia -CBG's q4h; Target range of 140 to 180 -SSI -Follow ICU Hypo/Hyperglycemia protocol -Check Hgb A1c    Best Practice (right click and "Reselect all SmartList Selections" daily)   Diet/type: NPO, bedside swallow eval pending DVT prophylaxis: SCD GI prophylaxis: H2B Lines: N/A Foley:  N/A Code Status:  full code Last date of multidisciplinary goals of care discussion [N/A]  Pt updated on plan of  care at bedside  Labs   CBC: Recent Labs  Lab 04/22/22 1604  WBC 7.4  NEUTROABS 5.5  HGB 16.0  HCT 45.1  MCV 89.1  PLT 294    Basic Metabolic Panel: Recent Labs  Lab 04/22/22 1604  NA 136  K 3.2*  CL 105  CO2 23  GLUCOSE 173*  BUN 18  CREATININE 1.81*  CALCIUM 9.4   GFR: Estimated Creatinine Clearance: 54.6 mL/min (A) (by C-G formula based on SCr of 1.81 mg/dL (H)). Recent Labs  Lab 04/22/22 1604  WBC 7.4    Liver Function Tests: Recent Labs  Lab 04/22/22 1604  AST 24  ALT 23  ALKPHOS 66  BILITOT 0.9  PROT 7.1  ALBUMIN 3.7   No results for input(s): "LIPASE", "AMYLASE" in the last 168 hours. No results for input(s): "AMMONIA" in the last 168 hours.  ABG    Component Value Date/Time   HCO3 25.1  01/13/2022 1250   O2SAT 40.7 01/13/2022 1250     Coagulation Profile: Recent Labs  Lab 04/22/22 1604  INR 1.0    Cardiac Enzymes: No results for input(s): "CKTOTAL", "CKMB", "CKMBINDEX", "TROPONINI" in the last 168 hours.  HbA1C: Hemoglobin A1C  Date/Time Value Ref Range Status  10/21/2016 11:48 AM 5.5  Final   Hgb A1c MFr Bld  Date/Time Value Ref Range Status  04/30/2020 09:07 AM 6.1 (H) 4.8 - 5.6 % Final    Comment:             Prediabetes: 5.7 - 6.4          Diabetes: >6.4          Glycemic control for adults with diabetes: <7.0   02/05/2009 02:40 PM  4.6 - 6.1 % Final   5.3 (NOTE) The ADA recommends the following therapeutic goal for glycemic control related to Hgb A1c measurement: Goal of therapy: <6.5 Hgb A1c  Reference: American Diabetes Association: Clinical Practice Recommendations 2010, Diabetes Care, 2010, 33: (Suppl  1).    CBG: No results for input(s): "GLUCAP" in the last 168 hours.  Review of Systems:   Positives in BOLD: Gen: Denies fever, chills, weight change, fatigue, night sweats HEENT: Denies blurred vision, double vision, hearing loss, tinnitus, sinus congestion, rhinorrhea, sore throat, neck pain, dysphagia PULM: Denies shortness of breath, cough, sputum production, hemoptysis, wheezing CV: Denies chest pain, edema, orthopnea, paroxysmal nocturnal dyspnea, palpitations GI: Denies abdominal pain, nausea, vomiting, diarrhea, hematochezia, melena, constipation, change in bowel habits GU: Denies dysuria, hematuria, polyuria, oliguria, urethral discharge Endocrine: Denies hot or cold intolerance, polyuria, polyphagia or appetite change Derm: Denies rash, dry skin, scaling or peeling skin change Heme: Denies easy bruising, bleeding, bleeding gums Neuro: Denies headache, numbness, weakness, slurred speech, loss of memory or consciousness   Past Medical History:  He,  has a past medical history of Abnormality of gait (10/30/2015), Allergic rhinitis,  Anxiety, Arthritis, Asthma, BPH (benign prostatic hyperplasia), Chronic headaches, Claustrophobia, Complication of anesthesia, Dyspnea, GERD (gastroesophageal reflux disease), Head trauma (2010), Headache (10/30/2015), Ischemic stroke ([place]) (06/2019), Kidney damage, Neck stiffness, Prediabetes, PTSD (post-traumatic stress disorder), RAD (reactive airway disease), Spinal cord compression ([place]), Stage 3b chronic kidney disease ([place]), Stroke ([place]), Vertigo, and Weakness of right side of body.   Surgical History:   Past Surgical History:  Procedure Laterality Date   BACK SURGERY  1996   spinal surgery and neck 2x   COLON SURGERY  05/26/2021   due to Diverticulitis   COLONOSCOPY WITH PROPOFOL N/A 03/02/2019  Procedure: COLONOSCOPY WITH PROPOFOL;  Surgeon: Benjamine Sprague, DO;  Location: [facility]COPY;  Service: General;  Laterality: N/A;   DIAGNOSTIC LAPAROSCOPY     ENDOSCOPIC TURBINATE REDUCTION Bilateral 08/22/2015   Procedure: ENDOSCOPIC TURBINATE REDUCTION;  Surgeon: Margaretha Sheffield, MD;  Location: [place];  Service: ENT;  Laterality: Bilateral;   EYE MUSCLE SURGERY Left    Due to conential left amblyopia x2   FINGER FRACTURE SURGERY Right    index finger   INSERTION OF MESH  10/24/2021   Procedure: INSERTION OF MESH;  Surgeon: Benjamine Sprague, DO;  Location: [facility] ORS;  Service: General;;   NASAL SINUS SURGERY  02/28/2015   NECK SURGERY     x3-metal plate in neck   SHOULDER SURGERY Right 1990     Social History:   reports that he quit smoking about 37 years ago. His smoking use included cigarettes. He has a 1.50 pack-year smoking history. He quit smokeless tobacco use about 22 months ago. He reports that he does not drink alcohol and does not use drugs.   Family History:  His family history includes Diabetes in his maternal grandmother and paternal grandmother; Fibromyalgia in his mother; Heart disease in his father; Hypertension in his maternal grandfather, maternal grandmother,  paternal grandfather, and paternal grandmother; Tremor in his father.   Allergies Allergies  Allergen Reactions   Serzone [Nefazodone] Swelling    Tongue and facial swelling   Ciprofloxacin     Other reaction(s): Other (See Comments) Jittery feeling   Advair Hfa [Fluticasone-Salmeterol] Other (See Comments)    Headaches   Codeine Nausea Only    Dizziness, sweating   Doxycycline Other (See Comments)    Severe headaches   Effexor [Venlafaxine]     "Feels out of it"   Montelukast Sodium     Causes headaches   Other     Can not have dye in IV, due to low kidney function   Paxil [Paroxetine Hcl]     "does not feel right in the head"   T-Pa [Alteplase] Swelling    Fascial flushing and tongue swelling. At [facility]   Esomeprazole Sodium Rash     Home Medications  Prior to Admission medications   Medication Sig Start Date End Date Taking? Authorizing Provider  fexofenadine (ALLEGRA) 180 MG tablet Take 180 mg by mouth daily.   Yes [provider]  fluticasone (FLONASE) 50 MCG/ACT nasal spray Place 2 sprays into both nostrils 2 (two) times daily. 07/25/20  Yes Jerrol Banana., MD  Fluticasone Propionate, Inhal, (FLOVENT DISKUS) 100 MCG/ACT AEPB Inhale 1 puff into the lungs 2 (two) times daily.   Yes [provider]  omeprazole (PRILOSEC) 40 MG capsule Take 40 mg by mouth daily. 03/24/22  Yes [provider]  Rimegepant Sulfate (NURTEC) 75 MG TBDP Take 75 mg by mouth daily as needed (migraine). 05/29/21  Yes Sakai, Isami, DO  acetaminophen (TYLENOL) 650 MG CR tablet Take 1,300 mg by mouth every 8 (eight) hours as needed for pain.    [provider]  albuterol (VENTOLIN HFA) 108 (90 Base) MCG/ACT inhaler Inhale 2 puffs into the lungs every 6 (six) hours as needed for wheezing or shortness of breath. 09/16/20   Lavonia Drafts, MD  amphetamine-dextroamphetamine (ADDERALL XR) 10 MG 24 hr capsule Take 10 mg by mouth every morning. 01/07/22   [provider]  busPIRone (BUSPAR) 15 MG tablet Take 1 tablet (15 mg total) by mouth at bedtime as needed (anxiety). Patient taking differently:  Take 15 mg by mouth at bedtime. 05/29/21   Lysle Pearl, Isami, DO  dexlansoprazole (DEXILANT) 60 MG capsule Take 1 capsule (60 mg total) by mouth daily. Patient not taking: Reported on 04/22/2022 07/25/20   Jerrol Banana., MD  famotidine (PEPCID) 40 MG tablet Take 1 tablet (40 mg total) by mouth daily. Patient not taking: Reported on 04/22/2022 09/30/21   Jerrol Banana., MD  hydrochlorothiazide (HYDRODIURIL) 25 MG tablet Take 25 mg by mouth daily as needed (swelling). 10/23/20 10/23/21  [provider]  lansoprazole (PREVACID) 30 MG capsule Take 30 mg by mouth daily. Patient not taking: Reported on 04/22/2022 01/07/22   [provider]  phenylephrine (SUDAFED PE) 10 MG TABS tablet Take 10 mg by mouth every 6 (six) hours as needed (congestion).    [provider]  sertraline (ZOLOFT) 50 MG tablet Take 50 mg by mouth daily. 02/17/22   [provider]  traMADol (ULTRAM) 50 MG tablet Take 1 tablet (50 mg total) by mouth every 8 (eight) hours as needed for severe pain. 10/24/21   Benjamine Sprague, DO     Critical care time: 45 minutes     Darel Hong, AGACNP-BC [facility] Prefer epic messenger for cross cover needs If after hours, please call E-link

## 2022-04-22 NOTE — ED Provider Notes (Signed)
[facility] Provider Note   Event Date/Time   First MD Initiated Contact with Patient 04/22/22 1600     (approximate) History  Code Stroke (Pt. To ED via EMS from home for headache and slurred speech  since 130 today. Pt. Has hx. Of same and received TNK. Pt. Is alert and oriented. )  HPI Randy Dean is a 56 y.o. male with a stated past medical history of CVA and receiving TNK at that time as well as cervical injury status post instrumentation who presents for intermittent slurred speech and right-sided weakness since 1330 today.  Patient states that the symptoms are similar to stroke symptoms that he has had in the past when he had to receive TNK.  On questioning, patient also endorses right-sided facial numbness ROS: Patient currently denies any vision changes, tinnitus, difficulty speaking, facial droop, sore throat, chest pain, shortness of breath, abdominal pain, nausea/vomiting/diarrhea, dysuria, or paresthesias in any extremity   Physical Exam  Triage Vital Signs: ED Triage Vitals  Enc Vitals Group     BP 04/22/22 1559 (!) 160/88     Pulse Rate 04/22/22 1559 90     Resp 04/22/22 1559 16     Temp 04/22/22 1629 98.2 F (36.8 C)     Temp Source 04/22/22 1629 Oral     SpO2 04/22/22 1559 97 %     Weight 04/22/22 1626 221 lb 12.5 oz (100.6 kg)     Height --      Head Circumference --      Peak Flow --      Pain Score 04/22/22 1625 7     Pain Loc --      Pain Edu? --      Excl. in [place]? --    Most recent vital signs: Vitals:   04/22/22 2130 04/22/22 2200  BP: (!) 132/97 (!) 142/90  Pulse: (!) 58 66  Resp: 18 20  Temp:    SpO2: 98% 100%   General: Awake, oriented x4. CV:  Good peripheral perfusion.  Resp:  Normal effort.  Abd:  No distention.  Other:  Overweight Caucasian middle-aged male laying in bed in no acute distress.  Right-sided facial droop.  Decreased sensation to the left face.  Right upper extremity weakness left lower extremity and  right lower extremity weakness, mild aphasia, mild dysarthria.  NIHSS 11 ED Results / Procedures / Treatments  Labs (all labs ordered are listed, but only abnormal results are displayed) Labs Reviewed  COMPREHENSIVE METABOLIC PANEL - Abnormal; Notable for the following components:      Result Value   Potassium 3.2 (*)    Glucose, Bld 173 (*)    Creatinine, Ser 1.81 (*)    GFR, Estimated 43 (*)    All other components within normal limits  URINALYSIS, COMPLETE (UACMP) WITH MICROSCOPIC - Abnormal; Notable for the following components:   Color, Urine STRAW (*)    APPearance CLEAR (*)    Protein, ur 30 (*)    All other components within normal limits  PROTIME-INR  APTT  CBC  DIFFERENTIAL  ETHANOL  URINE DRUG SCREEN, QUALITATIVE ([facility] ONLY)  HIV ANTIBODY (ROUTINE TESTING W REFLEX)  LIPID PANEL  HEMOGLOBIN A1C  CBC  BASIC METABOLIC PANEL  PROTIME-INR  MAGNESIUM  PHOSPHORUS  CBG MONITORING, ED  RADIOLOGY ED MD interpretation: CT of the head without contrast interpreted by me shows no hemorrhage or CT evidence of infarct with aspects of 10.  There is a possible  hyperdensity at the right ICA terminus -Agree with radiology assessment Official radiology report(s): CT HEAD CODE STROKE WO CONTRAST  Result Date: 04/22/2022 CLINICAL DATA:  Code stroke. Right-sided weakness. Severe headache. EXAM: CT HEAD WITHOUT CONTRAST TECHNIQUE: Contiguous axial images were obtained from the base of the skull through the vertex without intravenous contrast. RADIATION DOSE REDUCTION: This exam was performed according to the departmental dose-optimization program which includes automated exposure control, adjustment of the mA and/or kV according to patient size and/or use of iterative reconstruction technique. COMPARISON:  01/13/22 CT Brain FINDINGS: Brain: No evidence of acute infarction, hemorrhage, hydrocephalus, extra-axial collection or mass lesion/mass effect. Vascular: Possible hyperdensity at the  right ICA terminus. Skull: Normal. Negative for fracture or focal lesion. Sinuses/Orbits: No acute finding.  Bilateral lens replacements. Other: None. ASPECTS ([place] Stroke Program Early CT Score): 10 IMPRESSION: 1. No hemorrhage or CT evidence of an infarct.  Aspects is 10. 2. Possible hyperdensity at the right ICA terminus, which could be artifactual but could also be seen in the setting of acute thrombus. Findings were communicated to Dr. Curly Shores on 04/22/22 at 4:13 PM via the [facility] paging system. Electronically Signed   By: Marin Roberts M.D.   On: 04/22/2022 16:17   PROCEDURES: Critical Care performed: No .1-3 Lead EKG Interpretation  Performed by: Naaman Plummer, MD Authorized by: Naaman Plummer, MD     Interpretation: normal     ECG rate:  65   ECG rate assessment: normal     Rhythm: sinus rhythm     Ectopy: none     Conduction: normal    MEDICATIONS ORDERED IN ED: Medications   stroke: early stages of recovery book (has no administration in time range)  0.9 %  sodium chloride infusion ( Intravenous New Bag/Given 04/22/22 1820)  acetaminophen (TYLENOL) tablet 650 mg (650 mg Oral Given 04/22/22 1818)    Or  acetaminophen (TYLENOL) 160 MG/5ML solution 650 mg ( Per Tube See Alternative 04/22/22 1818)    Or  acetaminophen (TYLENOL) suppository 650 mg ( Rectal See Alternative 04/22/22 1818)  senna-docusate (Senokot-S) tablet 1 tablet (has no administration in time range)  labetalol (NORMODYNE) injection 10 mg (has no administration in time range)  sodium chloride flush (NS) 0.9 % injection 3 mL (3 mLs Intravenous Given 04/22/22 1634)  tenecteplase (TNKASE) injection for Stroke 25 mg (25 mg Intravenous Given 04/22/22 1633)  LORazepam (ATIVAN) injection 1 mg (1 mg Intravenous Given 04/22/22 1718)  sodium chloride flush (NS) 0.9 % injection 3 mL (3 mLs Intravenous Given 04/22/22 1633)  potassium chloride 10 mEq in 100 mL IVPB (0 mEq Intravenous Stopped 04/22/22 2230)   IMPRESSION /  MDM / ASSESSMENT AND PLAN / ED COURSE  I reviewed the triage vital signs and the nursing notes.                             The patient is on the cardiac monitor to evaluate for evidence of arrhythmia and/or significant heart rate changes. Patient's presentation is most consistent with acute presentation with potential threat to life or bodily function. Stroke alert PMH risk factors: CVA, hypertension, hyperlipidemia Neurologic Deficits: Facial numbness, facial droop, dysarthria, aphasia, right upper extremity weakness, right left lower extremity weakness Last known Well Time: 1330 NIH Stroke Score: 11 Given History and Exam I have lower suspicion for infectious etiology, neurologic changes secondary to toxicologic ingestion, seizure, complex migraine. Presentation concerning for possible stroke requiring workup.  Workup: Labs: POC glucose, CBC, BMP, LFTs, Troponin, PT/INR, PTT, Type and Screen Other Diagnostics: ECG, CXR, non-contrast head CT followed by CTA brain and neck (to r/o large vessel occlusion amenable to thrombectomy) Interventions: Patient administered tPA  Consult: Neurology. Discussed with Dr. Parke Simmers regarding patients neurological symptoms and last well-known time and eligibility for TPA criteria. Disposition: Admission to ICU.   FINAL CLINICAL IMPRESSION(S) / ED DIAGNOSES   Final diagnoses:  Facial droop  Weakness of right upper extremity  Weakness of both lower extremities  Slurred speech   Rx / DC Orders   ED Discharge Orders     None      Note:  This document was prepared using Dragon voice recognition software and may include unintentional dictation errors.   Naaman Plummer, MD 04/22/22 [phone]

## 2022-04-23 ENCOUNTER — Inpatient Hospital Stay: Payer: Medicare HMO

## 2022-04-23 ENCOUNTER — Inpatient Hospital Stay (HOSPITAL_COMMUNITY)
Admit: 2022-04-23 | Discharge: 2022-04-23 | Disposition: A | Discharge status: Home / Self Care | Payer: Medicare HMO | Attending: Pulmonary Disease | Admitting: Pulmonary Disease

## 2022-04-23 ENCOUNTER — Encounter: Payer: Self-pay | Admitting: Student in an Organized Health Care Education/Training Program

## 2022-04-23 ENCOUNTER — Other Ambulatory Visit: Payer: Self-pay

## 2022-04-23 DIAGNOSIS — I6389 Other cerebral infarction: Secondary | ICD-10-CM

## 2022-04-23 DIAGNOSIS — R29898 Other symptoms and signs involving the musculoskeletal system: Secondary | ICD-10-CM

## 2022-04-23 DIAGNOSIS — I639 Cerebral infarction, unspecified: Secondary | ICD-10-CM | POA: Diagnosis not present

## 2022-04-23 DIAGNOSIS — R2990 Unspecified symptoms and signs involving the nervous system: Secondary | ICD-10-CM

## 2022-04-23 LAB — CBC
HCT: 40.1 % (ref 39.0–52.0)
Hemoglobin: 13.6 g/dL (ref 13.0–17.0)
MCH: 31.3 pg (ref 26.0–34.0)
MCHC: 33.9 g/dL (ref 30.0–36.0)
MCV: 92.4 fL (ref 80.0–100.0)
Platelets: 162 10*3/uL (ref 150–400)
RBC: 4.34 MIL/uL (ref 4.22–5.81)
RDW: 12.5 % (ref 11.5–15.5)
WBC: 5.9 10*3/uL (ref 4.0–10.5)
nRBC: 0 % (ref 0.0–0.2)

## 2022-04-23 LAB — CBG MONITORING, ED
Glucose-Capillary: 88 mg/dL (ref 70–99)
Glucose-Capillary: 124 mg/dL — ABNORMAL HIGH (ref 70–99)
Glucose-Capillary: 100 mg/dL — ABNORMAL HIGH (ref 70–99)

## 2022-04-23 LAB — BASIC METABOLIC PANEL
Anion gap: 5 (ref 5–15)
BUN: 15 mg/dL (ref 6–20)
CO2: 24 mmol/L (ref 22–32)
Calcium: 8.5 mg/dL — ABNORMAL LOW (ref 8.9–10.3)
Chloride: 110 mmol/L (ref 98–111)
Creatinine, Ser: 1.69 mg/dL — ABNORMAL HIGH (ref 0.61–1.24)
GFR, Estimated: 46 mL/min — ABNORMAL LOW (ref >60)
Glucose, Bld: 88 mg/dL (ref 70–99)
Potassium: 4.5 mmol/L (ref 3.5–5.1)
Sodium: 139 mmol/L (ref 135–145)

## 2022-04-23 LAB — ECHOCARDIOGRAM COMPLETE
AR max vel: 2.46 cm2
AV Area VTI: 2.63 cm2
AV Area mean vel: 2.29 cm2
AV Mean grad: 2 mmHg
AV Peak grad: 3.5 mmHg
Ao pk vel: 0.93 m/s
Area-P 1/2: 3.54 cm2
Height: 72 in
S' Lateral: 3.1 cm
Weight: 3548.52 oz

## 2022-04-23 LAB — LIPID PANEL
Cholesterol: 187 mg/dL (ref 0–200)
HDL: 33 mg/dL — ABNORMAL LOW (ref >40)
LDL Cholesterol: 138 mg/dL — ABNORMAL HIGH (ref 0–99)
Total CHOL/HDL Ratio: 5.7 RATIO
Triglycerides: 79 mg/dL (ref <150)
VLDL: 16 mg/dL (ref 0–40)

## 2022-04-23 LAB — PHOSPHORUS: Phosphorus: 3.1 mg/dL (ref 2.5–4.6)

## 2022-04-23 LAB — GLUCOSE, CAPILLARY
Glucose-Capillary: 149 mg/dL — ABNORMAL HIGH (ref 70–99)
Glucose-Capillary: 111 mg/dL — ABNORMAL HIGH (ref 70–99)
Glucose-Capillary: 115 mg/dL — ABNORMAL HIGH (ref 70–99)
Glucose-Capillary: 130 mg/dL — ABNORMAL HIGH (ref 70–99)

## 2022-04-23 LAB — HEMOGLOBIN A1C
Hgb A1c MFr Bld: 6.1 % — ABNORMAL HIGH (ref 4.8–5.6)
Mean Plasma Glucose: 128.37 mg/dL

## 2022-04-23 LAB — MAGNESIUM: Magnesium: 1.8 mg/dL (ref 1.7–2.4)

## 2022-04-23 LAB — HIV ANTIBODY (ROUTINE TESTING W REFLEX): HIV Screen 4th Generation wRfx: NONREACTIVE

## 2022-04-23 LAB — PROTIME-INR
INR: 1.1 (ref 0.8–1.2)
Prothrombin Time: 14.4 seconds (ref 11.4–15.2)

## 2022-04-23 LAB — MRSA NEXT GEN BY PCR, NASAL: MRSA by PCR Next Gen: NOT DETECTED

## 2022-04-23 MED ORDER — ONDANSETRON HCL 4 MG/2ML IJ SOLN
4.0000 mg | Freq: Four times a day (QID) | INTRAMUSCULAR | Status: DC | PRN
  Administered 2022-04-23 – 2022-04-24 (×2): 4 mg via INTRAVENOUS
  Filled 2022-04-23 (×2): qty 2

## 2022-04-23 MED ORDER — RIMEGEPANT SULFATE 75 MG PO TBDP: 75.0000 mg | ORAL_TABLET | Freq: Every day | ORAL | Status: DC | PRN

## 2022-04-23 MED ORDER — BUSPIRONE HCL 10 MG PO TABS: 15.0000 mg | ORAL_TABLET | Freq: Every evening | ORAL | Status: DC | PRN

## 2022-04-23 MED ORDER — DIAZEPAM 5 MG/ML IJ SOLN
2.5000 mg | Freq: Three times a day (TID) | INTRAMUSCULAR | Status: DC | PRN
  Administered 2022-04-23 – 2022-04-24 (×2): 2.5 mg via INTRAVENOUS
  Filled 2022-04-23 (×3): qty 2

## 2022-04-23 MED ORDER — MORPHINE SULFATE (PF) 2 MG/ML IV SOLN: 2.0000 mg | INTRAVENOUS | Status: DC | PRN

## 2022-04-23 MED ORDER — MAGNESIUM SULFATE 2 GM/50ML IV SOLN
2.0000 g | Freq: Once | INTRAVENOUS | Status: AC
  Administered 2022-04-23: 2 g via INTRAVENOUS
  Filled 2022-04-23: qty 50

## 2022-04-23 MED ORDER — HYDROCODONE-ACETAMINOPHEN 5-325 MG PO TABS
1.0000 | ORAL_TABLET | Freq: Four times a day (QID) | ORAL | Status: DC | PRN
  Administered 2022-04-23: 1 via ORAL
  Administered 2022-04-23: 2 via ORAL
  Administered 2022-04-23 – 2022-04-24 (×2): 1 via ORAL
  Filled 2022-04-23 (×3): qty 1
  Filled 2022-04-23: qty 2

## 2022-04-23 MED ORDER — LORATADINE 10 MG PO TABS
10.0000 mg | ORAL_TABLET | Freq: Every day | ORAL | Status: DC
  Administered 2022-04-23 – 2022-04-24 (×2): 10 mg via ORAL
  Filled 2022-04-23 (×2): qty 1

## 2022-04-23 MED ORDER — ALBUTEROL SULFATE (2.5 MG/3ML) 0.083% IN NEBU: 3.0000 mL | INHALATION_SOLUTION | Freq: Four times a day (QID) | RESPIRATORY_TRACT | Status: DC | PRN

## 2022-04-23 MED ORDER — CHLORHEXIDINE GLUCONATE CLOTH 2 % EX PADS
6.0000 | MEDICATED_PAD | Freq: Every day | CUTANEOUS | Status: DC
  Administered 2022-04-23 – 2022-04-24 (×2): 6 via TOPICAL

## 2022-04-23 MED ORDER — DIAZEPAM 5 MG/ML IJ SOLN
2.5000 mg | Freq: Once | INTRAMUSCULAR | Status: AC
  Administered 2022-04-23: 2.5 mg via INTRAVENOUS
  Filled 2022-04-23: qty 2

## 2022-04-23 MED ORDER — DIAZEPAM 5 MG/ML IJ SOLN
5.0000 mg | Freq: Once | INTRAMUSCULAR | Status: AC
  Administered 2022-04-23: 5 mg via INTRAVENOUS

## 2022-04-23 MED ORDER — DEXMEDETOMIDINE HCL IN NACL 400 MCG/100ML IV SOLN
0.4000 ug/kg/h | INTRAVENOUS | Status: DC
  Administered 2022-04-23: 0.6 ug/kg/h via INTRAVENOUS
  Filled 2022-04-23: qty 100

## 2022-04-23 NOTE — Progress Notes (Addendum)
[place] for Electrolyte Monitoring and Replacement   Recent Labs: Potassium (mmol/L)  Date Value  04/23/2022 4.5   Magnesium (mg/dL)  Date Value  04/23/2022 1.8   Calcium (mg/dL)  Date Value  04/23/2022 8.5 (L)   Albumin (g/dL)  Date Value  04/22/2022 3.7  04/30/2020 3.9   Phosphorus (mg/dL)  Date Value  04/23/2022 3.1   Sodium (mmol/L)  Date Value  04/23/2022 139  04/30/2020 139   Assessment: Patient is a 56 y/o M with medical history including TIA, diverticulitis, prediabetes, TBI, anxiety / depression, HLD who presented to the ED 10/25 as code stroke and received TNK. Patient disposition is ICU.   Goal of Therapy:  Electrolytes within normal limits  Plan:  --Mg 1.8, will replace with Magnesium Sulfate 2gm IVPB x 1 dose --Corrected calcium = 9.1, no replacement indicated at this time --ALL other electrolytes WNL --Follow-up electrolytes with AM labs tomorrow  Lydiah Pong Rodriguez-Guzman PharmD, BCPS 04/23/2022 10:22 AM

## 2022-04-23 NOTE — ED Notes (Signed)
Pt to and from CT with this RN. Pt medicated per MAR. Pt tolerated CT well without issue or difficulty. Provider notified.

## 2022-04-23 NOTE — Progress Notes (Signed)
OT Cancellation Note  Patient Details Name: Randy Dean MRN: [id number] DOB: January 01, 1966   Cancelled Treatment:    Reason Eval/Treat Not Completed: Patient not medically ready. Consult received and chart reviewed.  Patient admitted for CVA work up and received TNK (at 1633, 04/22/22).  Per guidelines, to be on strict bedrest x24 hours post infusion and follow up imaging required to be completed prior to initiation of therapy.  Will continue to follow and initiate as medically appropriate.  Ardeth Perfect., MPH, MS, OTR/L ascom [phone] 04/23/22, 8:16 AM

## 2022-04-23 NOTE — ED Notes (Signed)
RN informed of bed assignment

## 2022-04-23 NOTE — Progress Notes (Signed)
PT Cancellation Note  Patient Details Name: Randy Dean MRN: [id number] DOB: 1966/06/22   Cancelled Treatment:    Reason Eval/Treat Not Completed: Patient not medically ready.  Consult received and chart reviewed.  Patient admitted for CVA work up and received TNK (at 1633, 04/22/22).  Per guidelines, to be on strict bedrest x24 hours post infusion and follow up imaging required to be completed prior to initiation of therapy.  Will continue to follow and evaluate when medically ready.    Marcelina Mclaurin 04/23/2022, 9:11 AM

## 2022-04-23 NOTE — Progress Notes (Signed)
*  PRELIMINARY RESULTS* Echocardiogram 2D Echocardiogram has been performed.  Sherrie Sport 04/23/2022, 10:16 AM

## 2022-04-23 NOTE — ED Notes (Signed)
Report given to receiving RN.

## 2022-04-23 NOTE — Progress Notes (Signed)
NAME:  Randy Dean, MRN:  [id number], DOB:  1966/02/27, LOS: 1 ADMISSION DATE:  04/22/2022, CONSULTATION DATE:  04/22/2022 REFERRING MD:  Dr. Cheri Fowler, CHIEF COMPLAINT:  Headache, neck pain, Right sided weakness, slurred speech  Brief Pt Description / Synopsis:  56 y.o male admitted with suspected CVA status post TNK.  History of Present Illness:  Randy Dean is a 56 y.o. male with a past medical history significant for stroke versus TIA versus functional episode versus seizure, cervical injury s/p instrumentation, PTSD, chronic cervicogenic headaches, anxiety, and CKD stage IIIb who presents to [facility] ED on 04/22/22 due to complaints of severe headache, neck pain, right sided weakness, and slurred speech.  Pt reports he has chronic headache and neck pain, unchanged from baseline prior to going to bed last night.  Upon waking this morning, he had the "worst headache I've ever head, different than other headaches" along with persistent neck pain.  At around 13:30 when he was going to get into the chair, he had difficultly ambulating due to right sided weakness.  He also endorsed right sided facial numbness, slurred speech, and some blurred vision on the right side.  He denies any chest pain, palpitations, shortness of breath, abdominal pain, nausea, vomiting, diarrhea, dysuria, paresthesias, fever, or chills.  Of note on 07/22/2019 he presented to [facility] with symptoms of right-sided weakness, dysarthria, expressive and receptive aphasia, with claustrophobia requiring sedation even for head CT, and with tPA infusion stopped due to concern for anaphylaxis.  This episode is similar to the episode today other than that episode was not associated with a severe headache and was associated with emotional lability including crying uncontrollably and confusion.  Last outpatient neurology evaluation 11/21/2021 with Dr. Manuella Ghazi, he was noted to have some mild left lower extremity dorsiflexion weakness 4+/5 for  which MRI C and T-spine were planned but will be set up under sedation due to patient's inability to tolerate the scans, and he was noted to have chronic right-sided facial weakness.  ED Course: Initial Vital Signs: 98.2 F orally, BP 160/88, pulse 90, RR 16, SpO2 97% on room air Significant Labs: potassium 3.2, glucose 173, BUN 18, Creatinine 1.81, ethyl alcohol <10 Imaging CT Head w/o contrast>>IMPRESSION: 1. No hemorrhage or CT evidence of an infarct.  Aspects is 10. 2. Possible hyperdensity at the right ICA terminus, which could be artifactual but could also be seen in the setting of acute thrombus. Medications Administered: TNK @ 16:45, Ativan 1 mg (required to obtain CT due to anxiety)  Code Stroke was activated, pt was evaluated by Neurology, and decision was made to administer TNK.  PCCM is asked to admit for further workup and treatment.   Please see "significant hospital events" section below for full detailed hospital course.  Pertinent  Medical History   Past Medical History:  Diagnosis Date   Abnormality of gait 10/30/2015   Allergic rhinitis    Anxiety    Arthritis    Asthma    well controlled   BPH (benign prostatic hyperplasia)    Chronic headaches    partially sinus/partially brain injury (per pt)   Claustrophobia    Complication of anesthesia    hard to wake up after 1 surgery but pt had had 3surgeries close together   Dyspnea    GERD (gastroesophageal reflux disease)    Head trauma 2010   has caused anxiety, memory issues and neck problems   Headache 10/30/2015   Right temporal    Ischemic stroke (  [place]) 06/2019   Kidney damage    due to years of taking meloxicam after head injury   Neck stiffness    metal plate.  mild limitations of side to side and up and dowm movement   Prediabetes    PTSD (post-traumatic stress disorder)    RAD (reactive airway disease)    Spinal cord compression (HCC)    -cervical from brain trauma   Stage 3b chronic kidney disease  ([place])    Stroke (HCC)    TIA x3- some residual right side weakness   Vertigo    Weakness of right side of body    from TIAs    Micro Data:  N/A  Antimicrobials:  N/A  Significant Hospital Events: Including procedures, antibiotic start and stop dates in addition to other pertinent events   10/25: Code stroke in ED, received TNK.  Neurology consulted.  PCCM asked to admit to ICU 10/26: Neuro exam stable, hemodynamically stable, stroke work up in progress  Interim History / Subjective:  -No significant events noted overnight -Afebrile, hemodynamically stable, on room air -Neuro exam stable, Stroke workup in progress -Plan for MRI later this evening after 24 hrs post TNK (administered @ 16:45 on 10/25) ~ plan for Precedex as he and his wife reports he will need to be sedated to tolerate and obtain scan -Pt reports he did not sleep much last night given frequent neuro checks along with persistent Headache   Objective   Blood pressure 137/60, pulse 67, temperature 97.8 F (36.6 C), resp. rate 16, height 6' (1.829 m), weight 100.6 kg, SpO2 97 %.        Intake/Output Summary (Last 24 hours) at 04/23/2022 0732 Last data filed at 04/23/2022 0138 Gross per 24 hour  Intake 203 ml  Output 1800 ml  Net -1597 ml    Filed Weights   04/22/22 1626 04/22/22 1630 04/23/22 0142  Weight: 100.6 kg 100.6 kg 100.6 kg    Examination: General: Acute on chronically ill-appearing male, laying in bed, on room air, no acute distress HENT: Atraumatic, normocephalic, neck supple, no JVD Lungs: Clear breath sounds throughout, even, nonlabored Cardiovascular: Regular rate and rhythm, S1-S2, no murmurs, some gallops Abdomen: Obese, soft, nontender, nondistended, no guarding rebound tenderness, bowel sound positive x4 Extremities: No deformities, no edema, no cyanosis, good peripheral circulation Neuro: Awake and alert, oriented x4, moves all extremities to command, right side slightly weaker than  left, sensation intact, no paresthesias GU: External male catheter in place draining yellow urine  Resolved Hospital Problem list     Assessment & Plan:   Suspected acute CVA s/p TNK vs. ? Functional neurological disorder vs ? complex headache PMHx: CVA vs TIA,  CT Head w/o contrast on presentation with NO hemorrhage or evidence of infarct [place] Cartotids with no significant stenosis of bilateral carotids -ICU monitoring -Frequent Neuro checks per TNK protocol -Neurology following, appreciate input -Strict BP control: Goal BP < 180/105 for 24 hrs s/p TNK ~ prn Labetalol, Cleviprex gtt if needed -No antiplatelets or anticoagulants for 24 hrs s/p TNK -Fasting lipid panel: Total cholesterol 187, HLD 33, LDL 138, Triglycerides 79 ~ pt reports he has not tolerated statin in the past, will defer to Neurology -Hgb A1c is pending -MRI Brain and cervical spine later this afternoon post 24 hrs following TNK if able (wife reports he may require heavy sedation) ~ plan to try precedex to obtain scan -Obtain repeat Head CT with any worsening of Neuro exam -Echocardiogram pending -PT/OT/Speech consults  CKD Stage IIIb Mild Hypokalemia ~ RESOLVED PMHx: BPH -Monitor I&O's / urinary output -Follow BMP -Ensure adequate renal perfusion -Avoid nephrotoxic agents as able -Replace electrolytes as indicated, pharmacy following  Hyperglycemia -CBG's q4h; Target range of 140 to 180 -SSI -Follow ICU Hypo/Hyperglycemia protocol -Check Hgb A1c    Best Practice (right click and "Reselect all SmartList Selections" daily)   Diet/type: Heart healthy carb modified DVT prophylaxis: SCD GI prophylaxis: H2B Lines: N/A Foley:  N/A Code Status:  full code Last date of multidisciplinary goals of care discussion [10/26]  10/26: Pt updated at bedside along with wife via telephone on plan of care.  All questions answered  Labs   CBC: Recent Labs  Lab 04/22/22 1604 04/23/22 0515  WBC 7.4 5.9  NEUTROABS  5.5  --   HGB 16.0 13.6  HCT 45.1 40.1  MCV 89.1 92.4  PLT 209 162     Basic Metabolic Panel: Recent Labs  Lab 04/22/22 1604 04/23/22 0515  NA 136 139  K 3.2* 4.5  CL 105 110  CO2 23 24  GLUCOSE 173* 88  BUN 18 15  CREATININE 1.81* 1.69*  CALCIUM 9.4 8.5*  MG  --  1.8  PHOS  --  3.1    GFR: Estimated Creatinine Clearance: 58.5 mL/min (A) (by C-G formula based on SCr of 1.69 mg/dL (H)). Recent Labs  Lab 04/22/22 1604 04/23/22 0515  WBC 7.4 5.9     Liver Function Tests: Recent Labs  Lab 04/22/22 1604  AST 24  ALT 23  ALKPHOS 66  BILITOT 0.9  PROT 7.1  ALBUMIN 3.7    No results for input(s): "LIPASE", "AMYLASE" in the last 168 hours. No results for input(s): "AMMONIA" in the last 168 hours.  ABG    Component Value Date/Time   HCO3 25.1 01/13/2022 1250   O2SAT 40.7 01/13/2022 1250     Coagulation Profile: Recent Labs  Lab 04/22/22 1604 04/23/22 0515  INR 1.0 1.1     Cardiac Enzymes: No results for input(s): "CKTOTAL", "CKMB", "CKMBINDEX", "TROPONINI" in the last 168 hours.  HbA1C: Hemoglobin A1C  Date/Time Value Ref Range Status  10/21/2016 11:48 AM 5.5  Final   Hgb A1c MFr Bld  Date/Time Value Ref Range Status  04/30/2020 09:07 AM 6.1 (H) 4.8 - 5.6 % Final    Comment:             Prediabetes: 5.7 - 6.4          Diabetes: >6.4          Glycemic control for adults with diabetes: <7.0   02/05/2009 02:40 PM  4.6 - 6.1 % Final   5.3 (NOTE) The ADA recommends the following therapeutic goal for glycemic control related to Hgb A1c measurement: Goal of therapy: <6.5 Hgb A1c  Reference: American Diabetes Association: Clinical Practice Recommendations 2010, Diabetes Care, 2010, 33: (Suppl  1).    CBG: No results for input(s): "GLUCAP" in the last 168 hours.  Review of Systems:   Positives in BOLD: Gen: Denies fever, chills, weight change, fatigue, night sweats HEENT: Denies blurred vision, double vision, hearing loss, tinnitus, sinus  congestion, rhinorrhea, sore throat, neck pain, dysphagia PULM: Denies shortness of breath, cough, sputum production, hemoptysis, wheezing CV: Denies chest pain, edema, orthopnea, paroxysmal nocturnal dyspnea, palpitations GI: Denies abdominal pain, nausea, vomiting, diarrhea, hematochezia, melena, constipation, change in bowel habits GU: Denies dysuria, hematuria, polyuria, oliguria, urethral discharge Endocrine: Denies hot or cold intolerance, polyuria, polyphagia or appetite change Derm: Denies  rash, dry skin, scaling or peeling skin change Heme: Denies easy bruising, bleeding, bleeding gums Neuro: Denies headache, numbness, weakness, slurred speech, loss of memory or consciousness   Past Medical History:  He,  has a past medical history of Abnormality of gait (10/30/2015), Allergic rhinitis, Anxiety, Arthritis, Asthma, BPH (benign prostatic hyperplasia), Chronic headaches, Claustrophobia, Complication of anesthesia, Dyspnea, GERD (gastroesophageal reflux disease), Head trauma (2010), Headache (10/30/2015), Ischemic stroke ([place]) (06/2019), Kidney damage, Neck stiffness, Prediabetes, PTSD (post-traumatic stress disorder), RAD (reactive airway disease), Spinal cord compression ([place]), Stage 3b chronic kidney disease ([place]), Stroke ([place]), Vertigo, and Weakness of right side of body.   Surgical History:   Past Surgical History:  Procedure Laterality Date   BACK SURGERY  1996   spinal surgery and neck 2x   COLON SURGERY  05/26/2021   due to Diverticulitis   COLONOSCOPY WITH PROPOFOL N/A 03/02/2019   Procedure: COLONOSCOPY WITH PROPOFOL;  Surgeon: Benjamine Sprague, DO;  Location: [place];  Service: General;  Laterality: N/A;   DIAGNOSTIC LAPAROSCOPY     ENDOSCOPIC TURBINATE REDUCTION Bilateral 08/22/2015   Procedure: ENDOSCOPIC TURBINATE REDUCTION;  Surgeon: Margaretha Sheffield, MD;  Location: [place];  Service: ENT;  Laterality: Bilateral;   EYE MUSCLE SURGERY Left    Due to conential  left amblyopia x2   FINGER FRACTURE SURGERY Right    index finger   INSERTION OF MESH  10/24/2021   Procedure: INSERTION OF MESH;  Surgeon: Benjamine Sprague, DO;  Location: [facility];  Service: General;;   NASAL SINUS SURGERY  02/28/2015   NECK SURGERY     x3-metal plate in neck   SHOULDER SURGERY Right 1990     Social History:   reports that he quit smoking about 37 years ago. His smoking use included cigarettes. He has a 1.50 pack-year smoking history. He quit smokeless tobacco use about 22 months ago. He reports that he does not drink alcohol and does not use drugs.   Family History:  His family history includes Diabetes in his maternal grandmother and paternal grandmother; Fibromyalgia in his mother; Heart disease in his father; Hypertension in his maternal grandfather, maternal grandmother, paternal grandfather, and paternal grandmother; Tremor in his father.   Allergies Allergies  Allergen Reactions   Serzone [Nefazodone] Swelling    Tongue and facial swelling   Ciprofloxacin     Other reaction(s): Other (See Comments) Jittery feeling   Advair Hfa [Fluticasone-Salmeterol] Other (See Comments)    Headaches   Codeine Nausea Only    Dizziness, sweating   Doxycycline Other (See Comments)    Severe headaches   Effexor [Venlafaxine]     "Feels out of it"   Montelukast Sodium     Causes headaches   Other     Can not have dye in IV, due to low kidney function   Paxil [Paroxetine Hcl]     "does not feel right in the head"   T-Pa [Alteplase] Swelling    Fascial flushing and tongue swelling. At [facility]   Esomeprazole Sodium Rash     Home Medications  Prior to Admission medications   Medication Sig Start Date End Date Taking? Authorizing Provider  fexofenadine (ALLEGRA) 180 MG tablet Take 180 mg by mouth daily.   Yes [provider]  fluticasone (FLONASE) 50 MCG/ACT nasal spray Place 2 sprays into both nostrils 2 (two) times daily. 07/25/20  Yes Jerrol Banana., MD   Fluticasone Propionate, Inhal, (FLOVENT DISKUS) 100 MCG/ACT AEPB Inhale 1 puff into the lungs  2 (two) times daily.   Yes [provider]  omeprazole (PRILOSEC) 40 MG capsule Take 40 mg by mouth daily. 03/24/22  Yes [provider]  Rimegepant Sulfate (NURTEC) 75 MG TBDP Take 75 mg by mouth daily as needed (migraine). 05/29/21  Yes Sakai, Isami, DO  acetaminophen (TYLENOL) 650 MG CR tablet Take 1,300 mg by mouth every 8 (eight) hours as needed for pain.    [provider]  albuterol (VENTOLIN HFA) 108 (90 Base) MCG/ACT inhaler Inhale 2 puffs into the lungs every 6 (six) hours as needed for wheezing or shortness of breath. 09/16/20   Lavonia Drafts, MD  amphetamine-dextroamphetamine (ADDERALL XR) 10 MG 24 hr capsule Take 10 mg by mouth every morning. 01/07/22   [provider]  busPIRone (BUSPAR) 15 MG tablet Take 1 tablet (15 mg total) by mouth at bedtime as needed (anxiety). Patient taking differently: Take 15 mg by mouth at bedtime. 05/29/21   Lysle Pearl, Isami, DO  dexlansoprazole (DEXILANT) 60 MG capsule Take 1 capsule (60 mg total) by mouth daily. Patient not taking: Reported on 04/22/2022 07/25/20   Jerrol Banana., MD  famotidine (PEPCID) 40 MG tablet Take 1 tablet (40 mg total) by mouth daily. Patient not taking: Reported on 04/22/2022 09/30/21   Jerrol Banana., MD  hydrochlorothiazide (HYDRODIURIL) 25 MG tablet Take 25 mg by mouth daily as needed (swelling). 10/23/20 10/23/21  [provider]  lansoprazole (PREVACID) 30 MG capsule Take 30 mg by mouth daily. Patient not taking: Reported on 04/22/2022 01/07/22   [provider]  phenylephrine (SUDAFED PE) 10 MG TABS tablet Take 10 mg by mouth every 6 (six) hours as needed (congestion).    [provider]  sertraline (ZOLOFT) 50 MG tablet Take 50 mg by mouth daily. 02/17/22   [provider]  traMADol (ULTRAM) 50 MG tablet Take 1 tablet (50 mg total) by mouth every 8  (eight) hours as needed for severe pain. 10/24/21   Benjamine Sprague, DO     Critical care time: 19 minutes     Darel Hong, AGACNP-BC [facility] Prefer epic messenger for cross cover needs If after hours, please call E-link

## 2022-04-23 NOTE — Progress Notes (Signed)
Neurology Progress Note   Major interval events/Subjective: Continues to have a headache although his right-sided weakness is much improved.  Speech is also now at his baseline.  No new complaints  Exam: Vitals:   04/23/22 1300 04/23/22 1313  BP: 139/89   Pulse: 66 67  Resp: (!) 24 13  Temp:    SpO2: 97% 94%   Gen: In bed, comfortable  Resp: non-labored breathing, no grossly audible wheezing Cardiac: Perfusing extremities well  Abd: soft, nt  Neuro: MS: Awake, alert, oriented CN: Mild right facial droop, otherwise cranial nerves are intact Motor: Giveaway weakness of the right lower more than upper extremity for me.  Full strength on the left side.  On more casual movements (getting out of bed, standing and crouching at my request, he uses both extremities equally without any focal weakness) Sensory: Intact to light touch in all 4 extremities   Pertinent Labs:  Basic Metabolic Panel: Recent Labs  Lab 04/22/22 1604 04/23/22 0515  NA 136 139  K 3.2* 4.5  CL 105 110  CO2 23 24  GLUCOSE 173* 88  BUN 18 15  CREATININE 1.81* 1.69*  CALCIUM 9.4 8.5*  MG  --  1.8  PHOS  --  3.1    CBC: Recent Labs  Lab 04/22/22 1604 04/23/22 0515  WBC 7.4 5.9  NEUTROABS 5.5  --   HGB 16.0 13.6  HCT 45.1 40.1  MCV 89.1 92.4  PLT 209 162    Coagulation Studies: Recent Labs    04/22/22 1604 04/23/22 0515  LABPROT 13.2 14.4  INR 1.0 1.1    Lab Results  Component Value Date   CHOL 187 04/23/2022   HDL 33 (L) 04/23/2022   LDLCALC 138 (H) 04/23/2022   TRIG 79 04/23/2022   CHOLHDL 5.7 04/23/2022   Lab Results  Component Value Date   HGBA1C 6.1 (H) 04/23/2022     Impression: Continue to favor a functional neurologic disorder given persistent functional qualities on examination, though underlying stroke could remain on the differential.  Awaiting MRI brain confirmation of no acute intracranial process.  We will also obtain MRI C-spine as C-spine pathology could  contribute to the right arm and leg weakness.  Explained to patient that his acute symptoms during this hospitalization would not be explained by T-spine or L-spine pathology and therefore this imaging will have to be completed on an outpatient basis  Recommendations: -Atorvastatin 40 mg daily for goal LDL less than 70 (presently 138) -A1c meeting goal -Continue to hold home antiplatelet until MRI confirms no hemorrhagic transformation -Long discussion with patient and father at bedside about functional limb weakness, with handout provided from [email or web address] -MRI brain and C-spine pending -Explained to patient that I will defer to his outpatient providers for his chronic headache management and he may benefit from referral to pain clinic due to the cervicogenic component -Appreciate CCM management of his comorbidities  Lesleigh Noe MD-PhD [facility] [phone]  [facility] coverage for [facility] is from 8 AM to 4 AM in-house and 4 PM to 8 PM by telephone/video. 8 PM to 8 AM emergent questions or overnight urgent questions should be addressed to Teleneurology On-call or [facility] neurohospitalist; contact information can be found on AMION  CRITICAL CARE Performed by: Lorenza Chick   Total critical care time: 40 minutes  Critical care time was exclusive of separately billable procedures and treating other patients.  Critical care was necessary to treat or prevent imminent or life-threatening deterioration, status post  thrombolytic administration, within the last 24 hours.  Critical care was time spent personally by me on the following activities: development of treatment plan with patient and/or surrogate as well as nursing, discussions with consultants, evaluation of patient's response to treatment, examination of patient, obtaining history from patient or surrogate, ordering and performing treatments and interventions, ordering and review of laboratory  studies, ordering and review of radiographic studies, pulse oximetry and re-evaluation of patient's condition.

## 2022-04-23 NOTE — Progress Notes (Addendum)
SLP Cancellation Note  Patient Details Name: Randy Dean MRN: [id number] DOB: 18-Apr-1966   Cancelled treatment:       Reason Eval/Treat Not Completed: SLP screened, no needs identified, will sign off Chart review and cognitive screen completed. Pt demonstrating intact verbal expression and receptive language skills with observed conversation with MD. Attention and safety reasoning grossly function with questioning. Pt/pt's father reported that initial slurred speech has resolved. Pt reports "cloudiness" secondary to lack of sleep and headache, but otherwise being at baseline. Father in agreement. Education shared regarding the importance of sleep hygiene and impact of internal distractions on processing. Based on completed and pt/family report, no follow up speech therapy needs indicate. Please re-consult in the event of change in report or presentation.   Randy Ishmeal Rorie Clapp MS [facility] SLP   Randy Dean 04/23/2022, 11:52 AM

## 2022-04-23 NOTE — ED Notes (Signed)
HA not relieved by medications, will notify provider.

## 2022-04-23 NOTE — ED Notes (Signed)
US at bedside

## 2022-04-24 DIAGNOSIS — K219 Gastro-esophageal reflux disease without esophagitis: Secondary | ICD-10-CM | POA: Diagnosis not present

## 2022-04-24 DIAGNOSIS — N183 Chronic kidney disease, stage 3 unspecified: Secondary | ICD-10-CM

## 2022-04-24 DIAGNOSIS — F447 Conversion disorder with mixed symptom presentation: Secondary | ICD-10-CM | POA: Diagnosis not present

## 2022-04-24 DIAGNOSIS — E785 Hyperlipidemia, unspecified: Secondary | ICD-10-CM

## 2022-04-24 DIAGNOSIS — G4486 Cervicogenic headache: Secondary | ICD-10-CM

## 2022-04-24 DIAGNOSIS — N1832 Chronic kidney disease, stage 3b: Secondary | ICD-10-CM

## 2022-04-24 DIAGNOSIS — F419 Anxiety disorder, unspecified: Secondary | ICD-10-CM

## 2022-04-24 DIAGNOSIS — H6690 Otitis media, unspecified, unspecified ear: Secondary | ICD-10-CM

## 2022-04-24 DIAGNOSIS — H65191 Other acute nonsuppurative otitis media, right ear: Secondary | ICD-10-CM | POA: Diagnosis not present

## 2022-04-24 DIAGNOSIS — F324 Major depressive disorder, single episode, in partial remission: Secondary | ICD-10-CM

## 2022-04-24 LAB — BASIC METABOLIC PANEL
Anion gap: 5 (ref 5–15)
BUN: 20 mg/dL (ref 6–20)
CO2: 25 mmol/L (ref 22–32)
Calcium: 8.6 mg/dL — ABNORMAL LOW (ref 8.9–10.3)
Chloride: 107 mmol/L (ref 98–111)
Creatinine, Ser: 1.8 mg/dL — ABNORMAL HIGH (ref 0.61–1.24)
GFR, Estimated: 43 mL/min — ABNORMAL LOW (ref >60)
Glucose, Bld: 110 mg/dL — ABNORMAL HIGH (ref 70–99)
Potassium: 4.4 mmol/L (ref 3.5–5.1)
Sodium: 137 mmol/L (ref 135–145)

## 2022-04-24 LAB — MAGNESIUM: Magnesium: 2.1 mg/dL (ref 1.7–2.4)

## 2022-04-24 LAB — CBC
HCT: 41.3 % (ref 39.0–52.0)
Hemoglobin: 14 g/dL (ref 13.0–17.0)
MCH: 31.2 pg (ref 26.0–34.0)
MCHC: 33.9 g/dL (ref 30.0–36.0)
MCV: 92 fL (ref 80.0–100.0)
Platelets: 154 10*3/uL (ref 150–400)
RBC: 4.49 MIL/uL (ref 4.22–5.81)
RDW: 12.2 % (ref 11.5–15.5)
WBC: 6.2 10*3/uL (ref 4.0–10.5)
nRBC: 0 % (ref 0.0–0.2)

## 2022-04-24 LAB — SEDIMENTATION RATE: Sed Rate: 16 mm/hr (ref 0–20)

## 2022-04-24 LAB — PHOSPHORUS: Phosphorus: 3.8 mg/dL (ref 2.5–4.6)

## 2022-04-24 LAB — GLUCOSE, CAPILLARY
Glucose-Capillary: 134 mg/dL — ABNORMAL HIGH (ref 70–99)
Glucose-Capillary: 108 mg/dL — ABNORMAL HIGH (ref 70–99)
Glucose-Capillary: 121 mg/dL — ABNORMAL HIGH (ref 70–99)

## 2022-04-24 MED ORDER — ROSUVASTATIN CALCIUM 20 MG PO TABS: 20.0000 mg | ORAL_TABLET | Freq: Every day | ORAL | 0 refills | Status: DC

## 2022-04-24 MED ORDER — POLYETHYLENE GLYCOL 3350 17 G PO PACK
17.0000 g | PACK | Freq: Every day | ORAL | Status: DC
  Administered 2022-04-24: 17 g via ORAL
  Filled 2022-04-24: qty 1

## 2022-04-24 MED ORDER — ROSUVASTATIN CALCIUM 10 MG PO TABS: 20.0000 mg | ORAL_TABLET | Freq: Every day | ORAL | Status: DC

## 2022-04-24 MED ORDER — ATORVASTATIN CALCIUM 20 MG PO TABS: 40.0000 mg | ORAL_TABLET | Freq: Every day | ORAL | Status: DC

## 2022-04-24 MED ORDER — SODIUM CHLORIDE 0.9 % IV SOLN
1.5000 g | Freq: Once | INTRAVENOUS | Status: AC
  Administered 2022-04-24: 1.5 g via INTRAVENOUS
  Filled 2022-04-24: qty 1.5

## 2022-04-24 MED ORDER — PREDNISONE 10 MG PO TABS: ORAL_TABLET | ORAL | 0 refills | Status: DC

## 2022-04-24 MED ORDER — AMOXICILLIN-POT CLAVULANATE 875-125 MG PO TABS
1.0000 | ORAL_TABLET | Freq: Two times a day (BID) | ORAL | Status: DC
  Filled 2022-04-24: qty 1

## 2022-04-24 MED ORDER — ASPIRIN 81 MG PO TBEC
81.0000 mg | DELAYED_RELEASE_TABLET | Freq: Every day | ORAL | Status: DC
  Administered 2022-04-24: 81 mg via ORAL
  Filled 2022-04-24: qty 1

## 2022-04-24 MED ORDER — AMOXICILLIN-POT CLAVULANATE 875-125 MG PO TABS: 1.0000 | ORAL_TABLET | Freq: Two times a day (BID) | ORAL | 0 refills | Status: AC

## 2022-04-24 MED ORDER — MAGNESIUM SULFATE 2 GM/50ML IV SOLN
2.0000 g | Freq: Once | INTRAVENOUS | Status: AC
  Administered 2022-04-24: 2 g via INTRAVENOUS
  Filled 2022-04-24: qty 50

## 2022-04-24 MED ORDER — HYDROCODONE-ACETAMINOPHEN 5-325 MG PO TABS: 1.0000 | ORAL_TABLET | Freq: Four times a day (QID) | ORAL | 0 refills | Status: DC | PRN

## 2022-04-24 MED ORDER — METHYLPREDNISOLONE SODIUM SUCC 40 MG IJ SOLR
40.0000 mg | Freq: Every day | INTRAMUSCULAR | Status: DC
  Administered 2022-04-24: 40 mg via INTRAVENOUS
  Filled 2022-04-24: qty 1

## 2022-04-24 MED ORDER — POLYETHYLENE GLYCOL 3350 17 G PO PACK: 17.0000 g | PACK | Freq: Every day | ORAL | 0 refills | Status: AC | PRN

## 2022-04-24 NOTE — Evaluation (Signed)
Occupational Therapy Evaluation Patient Details Name: Randy Dean MRN: [id number] DOB: 08-09-1965 Today's Date: 04/24/2022   History of Present Illness 56 y.o. male with a past medical history significant for stroke versus TIA versus functional episode versus seizure, cervical injury s/p instrumentation, PTSD, chronic cervicogenic headaches, anxiety, and CKD stage IIIb who presents to [facility] ED on 04/22/22 due to complaints of severe headache, neck pain, right sided weakness, and slurred speech. Pt received TNK. Follow up MRI negative.   Clinical Impression   Pt was seen for OT evaluation this date. Prior to hospital admission, pt was ambulating with SPC with hx of multiple recent falls and pt endorsing need for something better than SPC but "too stubborn" to use. Pt lives with his spouse in a [facility], [place], 2-3 STE, R rail, and recently renovated bathroom with walk in shower. Pt presents to acute OT demonstrating impaired ADL performance and functional mobility 2/2 mild R side sensation deficits, activity tolerance, and balance (See OT problem list). Pt notes mild increase in R eye blurriness (able to compensate in order to negotiate environment and read fine print). Pt currently requires mod indep with bed mobility, supv for ADL transfers with RW, and ambulates ~70' with RW and BUE support through RW to minimize risk of R>LLE knee buckling. Pt noting increase in sensory deficits with WBing through RW over time 2/2 old cervical spinal issues/surgery from his past but notes he knows his limits and knows when to rest. Pt/spouse educated in role of acute OT, recommendations, and falls prevention as well as shower modifications/AE to improve safety/indep. Pt also educated in 2WW to improve balance/safety with mobility. Pt/spouse endorsed feeling like 2WW would be beneficial. Pt would benefit from skilled OT services while hospitalized to address noted impairments and functional limitations (see below for any  additional details) in order to maximize safety and independence while minimizing falls risk and caregiver burden. Upon hospital discharge, do not anticipate skilled OT needs.     Recommendations for follow up therapy are one component of a multi-disciplinary discharge planning process, led by the attending physician.  Recommendations may be updated based on patient status, additional functional criteria and insurance authorization.   Follow Up Recommendations  No OT follow up    Assistance Recommended at Discharge PRN  Patient can return home with the following A little help with walking and/or transfers;Assist for transportation;Help with stairs or ramp for entrance    Functional Status Assessment  Patient has had a recent decline in their functional status and demonstrates the ability to make significant improvements in function in a reasonable and predictable amount of time.  Equipment Recommendations  Other (comment) (2WW)    Recommendations for Other Services       Precautions / Restrictions Precautions Precautions: Fall Restrictions Weight Bearing Restrictions: No      Mobility Bed Mobility Overal bed mobility: Modified Independent                  Transfers Overall transfer level: Modified independent Equipment used: Rolling walker (2 wheels), None                      Balance Overall balance assessment: Needs assistance Sitting-balance support: No upper extremity supported, Feet supported Sitting balance-Leahy Scale: Good     Standing balance support: Bilateral upper extremity supported Standing balance-Leahy Scale: Good Standing balance comment: BUE support with mobility to decrease risk of buckling  ADL either performed or assessed with clinical judgement   ADL Overall ADL's : Modified independent                                       General ADL Comments: Pt able to adjust socks, don  shoes, and manage meal items without assist, some FMC deficits noted with meal set up using R hand but pt notes this is baseline     Vision         Perception     Praxis      Pertinent Vitals/Pain Pain Assessment Pain Assessment: 0-10 Pain Score: 4  Pain Location: R side headache Pain Descriptors / Indicators: Headache Pain Intervention(s): Monitored during session, Repositioned     Hand Dominance Right   Extremity/Trunk Assessment Upper Extremity Assessment Upper Extremity Assessment: Overall WFL for tasks assessed;RUE deficits/detail RUE Deficits / Details: grossly WFL with isolated strength testing, some hx of sensation deficits to R hand leading to mild impairments in [facility] but functionally able to compensate RUE Sensation: decreased light touch RUE Coordination: decreased fine motor   Lower Extremity Assessment Lower Extremity Assessment: Overall WFL for tasks assessed;RLE deficits/detail RLE Deficits / Details: grossly WFL with isolated strength testing, some hx of sensation deficits to R ankle leading to risk of buckling with increased exertion but functionally able to compensate   Cervical / Trunk Assessment Cervical / Trunk Assessment: Neck Surgery   Communication Communication Communication: Other (comment) (pt reports hx of PRN word finding difficulty from old CVA)   Cognition Arousal/Alertness: Awake/alert Behavior During Therapy: WFL for tasks assessed/performed Overall Cognitive Status: Within Functional Limits for tasks assessed                                 General Comments: pt endorses hx of STM deficits since previous CVA     General Comments       Exercises Other Exercises Other Exercises: Pt/spouse educated in role of acute OT, recommendations, and falls prevention as well as shower modifications/AE to improve safety/indep   Shoulder Instructions      Home Living Family/patient expects to be discharged to:: Private  residence Living Arrangements: Spouse/significant other Available Help at Discharge: Family;Available PRN/intermittently Type of Home: House Home Access: Stairs to enter [employer] of Steps: 2-3 Entrance Stairs-Rails: Right Home Layout: One level     Bathroom Shower/Tub: Occupational psychologist: Handicapped height     Home Equipment: Shower seat - built in;Rollator (4 wheels);Cane - single point;Crutches;Hand held shower head          Prior Functioning/Environment Prior Level of Function : Independent/Modified Independent;Driving;History of Falls (last six months)             Mobility Comments: pt ambulating with SPC but does endorse need to use something better but he's just been too "stubborn" to switch, 3-4 falls in past 2-3 wks 2/2 bilat knee buckling with exertion ADLs Comments: indep with ADL and IADL        OT Problem List: Decreased strength;Decreased coordination;Pain;Impaired sensation;Impaired balance (sitting and/or standing);Decreased knowledge of use of DME or AE      OT Treatment/Interventions: Self-care/ADL training;Therapeutic exercise;Therapeutic activities;Neuromuscular education;DME and/or AE instruction;Patient/family education;Balance training    OT Goals(Current goals can be found in the care plan section) Acute Rehab OT Goals Patient Stated Goal: figure  out why this happened OT Goal Formulation: With patient/family Time For Goal Achievement: 05/08/22 Potential to Achieve Goals: Good ADL Goals Additional ADL Goal #1: Pt will complete all aspects of bathing primarily from seated position with mod indep, 1/1 opportunity. Additional ADL Goal #2: Pt will verbalize plan to implement at least 2 learned falls prevention strategies.  OT Frequency: Min 2X/week    Co-evaluation              AM-PAC OT "6 Clicks" Daily Activity     Outcome Measure Help from another person eating meals?: None Help from another person taking  care of personal grooming?: None Help from another person toileting, which includes using toliet, bedpan, or urinal?: A Little Help from another person bathing (including washing, rinsing, drying)?: A Little Help from another person to put on and taking off regular upper body clothing?: None Help from another person to put on and taking off regular lower body clothing?: None 6 Click Score: 22   End of Session Equipment Utilized During Treatment: Gait belt;Rolling walker (2 wheels) Nurse Communication: Mobility status  Activity Tolerance: Patient tolerated treatment well Patient left: in bed;with call bell/phone within reach;with family/visitor present (seated EOB per pt's preference to eat, wife present)  OT Visit Diagnosis: Other abnormalities of gait and mobility (R26.89);Repeated falls (R29.6)                Time: 2863-8177 OT Time Calculation (min): 44 min Charges:  OT General Charges $OT Visit: 1 Visit OT Evaluation $OT Eval Moderate Complexity: 1 Mod OT Treatments $Self Care/Home Management : 23-37 mins  Ardeth Perfect., MPH, MS, OTR/L ascom [phone] 04/24/22, 9:45 AM

## 2022-04-24 NOTE — Assessment & Plan Note (Signed)
CKD stage IIIb.  Creatinine 1.80 upon discharge

## 2022-04-24 NOTE — Discharge Summary (Signed)
Physician Discharge Summary   Patient: Randy Dean MRN: [id number] DOB: [birth year]  Admit date:     04/22/2022  Discharge date: 04/24/22  Discharge Physician: Loletha Grayer   PCP: Rusty Aus, MD   Recommendations at discharge:   Follow-up with PCP Emily Filbert in 5 days Follow-up with neurology Dr. Theodoro Kalata  Discharge Diagnoses: Principal Problem:   Functional neurological symptom disorder with mixed symptoms Active Problems:   Cervicogenic headache   Otitis media   Anxiety   Clinical depression   Acid reflux   HLD (hyperlipidemia)   Weakness of right upper extremity   CKD (chronic kidney disease), stage III [facility])    Hospital Course: Patient was admitted to the hospital on 04/22/2022 with a critical care team for suspected CVA.  Patient was given TNKase.  MRI of the brain came back negative.  Neurology believes this is a functional neurological symptom disorder and recommends an outpatient neurological follow-up.  I recommended statin and aspirin.  Since the patient was having a lot of neck pain and headache, I gave empiric steroid which seemed to help his headache.  We will give a prednisone taper.  Sedimentation rate was 16 and since does not temporal arteritis.  Assessment and Plan: * Functional neurological symptom disorder with mixed symptoms Seen by neurology here and recommended outpatient neurological follow-up.  Initially was thought to have a stroke and was given TNK.  MRI of the brain negative.  Neurology did not recommend statin and aspirin.  Physical therapy recommended outpatient PT.  Cervicogenic headache Neurology recommended an outpatient follow-up.  Can consider pain management, acupuncture or biofeedback.  I did prescribe a dose of Solu-Medrol here which seemed to help his headache so I will give a prednisone taper upon going home.  I did prescribe a few pain pills.  Sedimentation rate was 16 and so this is not temporal arteritis.  Otitis  media Patient having pain in the area of right ear and tympanic membrane is erythematous.  Given 1 dose of Unasyn and will prescribe Augmentin upon going home.     CKD (chronic kidney disease), stage III (HCC) CKD stage IIIb.  Creatinine 1.80 upon discharge  Weakness of right upper extremity Outpatient PT follow-up  HLD (hyperlipidemia) LDL 138 and goal less than 70.  Crestor prescribed.  Acid reflux Continue omeprazole  Clinical depression Continue Zoloft  Anxiety Continue BuSpar         Consultants: Critical care team, neurology Procedures performed: Tnk Disposition: Home Diet recommendation:  Cardiac and Carb modified diet DISCHARGE MEDICATION: Allergies as of 04/24/2022       Reactions   Serzone [nefazodone] Swelling   Tongue and facial swelling   Ciprofloxacin    Other reaction(s): Other (See Comments) Jittery feeling   Advair Hfa [fluticasone-salmeterol] Other (See Comments)   Headaches   Codeine Nausea Only   Dizziness, sweating   Doxycycline Other (See Comments)   Severe headaches   Effexor [venlafaxine]    "Feels out of it"   Montelukast Sodium    Causes headaches   Other    Can not have dye in IV, due to low kidney function   Paxil [paroxetine Hcl]    "does not feel right in the head"   T-pa [alteplase] Swelling   Fascial flushing and tongue swelling. At [facility]   Esomeprazole Sodium Rash        Medication List     STOP taking these medications    phenylephrine 10 MG Tabs tablet Commonly  known as: SUDAFED PE       TAKE these medications    acetaminophen 650 MG CR tablet Commonly known as: TYLENOL Take 1,300 mg by mouth every 8 (eight) hours as needed for pain.   albuterol 108 (90 Base) MCG/ACT inhaler Commonly known as: VENTOLIN HFA Inhale 2 puffs into the lungs every 6 (six) hours as needed for wheezing or shortness of breath.   amoxicillin-clavulanate 875-125 MG tablet Commonly known as: AUGMENTIN Take 1 tablet by mouth  every 12 (twelve) hours for 7 days.   amphetamine-dextroamphetamine 10 MG 24 hr capsule Commonly known as: ADDERALL XR Take 10 mg by mouth daily as needed (prn).   aspirin EC 81 MG tablet Take 81 mg by mouth daily. Swallow whole.   busPIRone 15 MG tablet Commonly known as: BUSPAR Take 1 tablet (15 mg total) by mouth at bedtime as needed (anxiety). What changed: reasons to take this   fexofenadine 180 MG tablet Commonly known as: ALLEGRA Take 180 mg by mouth daily.   Flovent Diskus 100 MCG/ACT Aepb Generic drug: Fluticasone Propionate (Inhal) Inhale 1 puff into the lungs 2 (two) times daily.   fluticasone 50 MCG/ACT nasal spray Commonly known as: FLONASE Place 2 sprays into both nostrils 2 (two) times daily.   HYDROcodone-acetaminophen 5-325 MG tablet Commonly known as: NORCO/VICODIN Take 1 tablet by mouth every 6 (six) hours as needed for severe pain.   Nurtec 75 MG Tbdp Generic drug: Rimegepant Sulfate Take 75 mg by mouth daily as needed (migraine).   omeprazole 40 MG capsule Commonly known as: PRILOSEC Take 40 mg by mouth daily.   polyethylene glycol 17 g packet Commonly known as: MIRALAX / GLYCOLAX Take 17 g by mouth daily as needed.   predniSONE 10 MG tablet Commonly known as: DELTASONE 3 tabs po day1; 2 tabs po day2,3; 1 tab po day4,5; 1/2 tab po day6,7   rosuvastatin 20 MG tablet Commonly known as: CRESTOR Take 1 tablet (20 mg total) by mouth at bedtime.   sertraline 50 MG tablet Commonly known as: ZOLOFT Take 50 mg by mouth daily.               Durable Medical Equipment  (From admission, onward)           Start     Ordered   04/24/22 1122  For home use only DME Walker rolling  Once       Question Answer Comment  Walker: With Bertha   Patient needs a walker to treat with the following condition General weakness      04/24/22 1121   04/24/22 1105  For home use only DME Walker  Once       Question:  Patient needs a walker to  treat with the following condition  Answer:  Unsteady gait when walking   04/24/22 1104            Follow-up Information     Rusty Aus, MD Follow up in 5 day(s).   Specialty: Internal Medicine Contact information: [place] [address] [phone]         Vladimir Crofts, MD Follow up in 2 week(s).   Specialty: Neurology Contact information: [facility] [place] [address] [phone]                Discharge Exam: [place] [employer]   04/22/22 1626 04/22/22 1630 04/23/22 0142  Weight: 100.6 kg 100.6  kg 100.6 kg   Physical Exam HENT:     Head: Normocephalic.     Mouth/Throat:     Pharynx: No oropharyngeal exudate.  Eyes:     General: Lids are normal.     Conjunctiva/sclera: Conjunctivae normal.  Cardiovascular:     Rate and Rhythm: Normal rate and regular rhythm.     Heart sounds: Normal heart sounds, S1 normal and S2 normal.  Pulmonary:     Breath sounds: No decreased breath sounds, wheezing, rhonchi or rales.  Abdominal:     Palpations: Abdomen is soft.     Tenderness: There is no abdominal tenderness.  Musculoskeletal:     Right lower leg: No swelling.     Left lower leg: No swelling.  Skin:    General: Skin is warm.     Findings: No rash.  Neurological:     Mental Status: He is alert.     Comments: Power 5 out of 5 bilateral lower extremities.  Power 4+ Right upper extremity.  Able to straight leg raise bilaterally.      Condition at discharge: stable  The results of significant diagnostics from this hospitalization (including imaging, microbiology, ancillary and laboratory) are listed below for reference.   Imaging Studies: MR CERVICAL SPINE WO CONTRAST  Result Date: 04/23/2022 CLINICAL DATA:  Chronic neck pain, right-sided weakness and headache EXAM: MRI CERVICAL SPINE WITHOUT CONTRAST TECHNIQUE: Multiplanar, multisequence MR imaging  of the cervical spine was performed. No intravenous contrast was administered. COMPARISON:  MRI cervical spine 06/28/2019 FINDINGS: Alignment: Straightening of the normal cervical lordosis. Vertebrae: Status post ACDF C4-C7, with significant susceptibility that obscures the vertebral bodies and, to some extent, the cervical spinal cord. Redemonstrated posterior decompression C3-C7. Cord: Evaluation is somewhat limited by susceptibility artifact from adjacent spinal hardware. Within this limitation, there is unchanged increased T2 signal within the central spinal cord at C4-C5, consistent with chronic myelomalacia. No new abnormal signal in the cervical spinal cord. Posterior Fossa, vertebral arteries, paraspinal tissues: Negative. Disc levels: C2-C3: No significant disc bulge. Facet and uncovertebral hypertrophy. Ligamentum flavum hypertrophy. No spinal canal stenosis. Mild right neural foraminal narrowing, unchanged C3-C4: Prior posterior decompression. Mild disc bulge. Facet and likely uncovertebral hypertrophy. No spinal canal stenosis. Mild right neural foraminal narrowing. C4-C5: Prior ACDF and posterior decompression. No spinal canal stenosis or neural foraminal narrowing. C5-C6: Prior ACDF and posterior decompression. Right uncovertebral hypertrophy. No spinal canal stenosis. Mild right neural foraminal narrowing. C6-C7: Prior ACDF and posterior decompression. No spinal canal stenosis or neural foraminal narrowing. C7-T1: Evaluation is limited by susceptibility artifact. Within this limitation, no spinal canal stenosis or neural foraminal narrowing. IMPRESSION: 1. Status post ACDF C4-C7 with posterior decompression C3-C7. No spinal canal stenosis. 2. Mild right neural foraminal narrowing at C2-C3, C3-C4, and C5-C6. 3. Unchanged chronic myelomalacia at C4-C5. No new abnormal signal in the cervical spinal cord. Electronically Signed   By: Merilyn Baba M.D.   On: 04/23/2022 23:34   MR BRAIN WO  CONTRAST  Result Date: 04/23/2022 CLINICAL DATA:  Right-sided weakness and headache EXAM: MRI HEAD WITHOUT CONTRAST TECHNIQUE: Multiplanar, multiecho pulse sequences of the brain and surrounding structures were obtained without intravenous contrast. COMPARISON:  12/18/2016 MRI head, correlation is also made with 04/23/2022 CT head FINDINGS: Brain: No restricted diffusion to suggest acute or subacute infarct. No acute hemorrhage, mass, mass effect, midline shift. No hydrocephalus or extra-axial collection. No hemosiderin deposition to suggest remote hemorrhage. Cerebral volume is within normal limits for age. Scattered T2 hyperintense  signal in the periventricular white matter, likely the sequela of minimal chronic small vessel ischemic disease. Vascular: Normal arterial flow voids. Skull and upper cervical spine: Normal marrow signal. Sinuses/Orbits: Status post bilateral lens replacements. Clear paranasal sinuses. Other: The mastoids are well aerated. IMPRESSION: No acute intracranial process. No evidence of acute or subacute infarct. Electronically Signed   By: Merilyn Baba M.D.   On: 04/23/2022 23:26   CT HEAD WO CONTRAST (5MM)  Result Date: 04/23/2022 CLINICAL DATA:  Stroke follow-up EXAM: CT HEAD WITHOUT CONTRAST TECHNIQUE: Contiguous axial images were obtained from the base of the skull through the vertex without intravenous contrast. RADIATION DOSE REDUCTION: This exam was performed according to the departmental dose-optimization program which includes automated exposure control, adjustment of the mA and/or kV according to patient size and/or use of iterative reconstruction technique. COMPARISON:  CT head 04/22/2022 FINDINGS: Brain: No evidence of acute infarction, hemorrhage, hydrocephalus, extra-axial collection or mass lesion/mass effect. Vascular: Negative for hyperdense vessel Skull: Negative Sinuses/Orbits: Paranasal sinuses clear. Prior sinus surgery. Bilateral cataract extraction Other: None  IMPRESSION: Negative CT head. Electronically Signed   By: Franchot Gallo M.D.   On: 04/23/2022 16:38   ECHOCARDIOGRAM COMPLETE  Result Date: 04/23/2022    ECHOCARDIOGRAM REPORT   Patient Name:   Randy Dean Date of Exam: 04/23/2022 Medical Rec #:  [id number]        Height:       72.0 in Accession #:    [id number]       Weight:       221.8 lb Date of Birth:  12-01-1965        BSA:          2.226 m Patient Age:    11 years         BP:           137/60 mmHg Patient Gender: M                HR:           60 bpm. Exam Location:  [facility] Procedure: 2D Echo, Cardiac Doppler and Color Doppler Indications:     Stroke I63.9  History:         Patient has no prior history of Echocardiogram examinations.                  Stroke; Signs/Symptoms:Dyspnea. Anxiety.  Sonographer:     Sherrie Sport Referring Phys:  [id number] Bradly Bienenstock Diagnosing Phys: Ida Rogue MD  Sonographer Comments: Suboptimal apical window and no subcostal window. IMPRESSIONS  1. Left ventricular ejection fraction, by estimation, is 60 to 65%. The left ventricle has normal function. The left ventricle has no regional wall motion abnormalities. There is mild left ventricular hypertrophy. Left ventricular diastolic parameters are consistent with Grade I diastolic dysfunction (impaired relaxation).  2. Right ventricular systolic function is normal. The right ventricular size is normal. There is normal pulmonary artery systolic pressure. The estimated right ventricular systolic pressure is 23.3 mmHg.  3. The mitral valve is normal in structure. No evidence of mitral valve regurgitation. No evidence of mitral stenosis.  4. The aortic valve has an indeterminant number of cusps. Aortic valve regurgitation is not visualized. No aortic stenosis is present.  5. The inferior vena cava is normal in size with greater than 50% respiratory variability, suggesting right atrial pressure of 3 mmHg. FINDINGS  Left Ventricle: Left ventricular ejection fraction, by  estimation, is 60 to 65%. The left ventricle has  normal function. The left ventricle has no regional wall motion abnormalities. The left ventricular internal cavity size was normal in size. There is  mild left ventricular hypertrophy. Left ventricular diastolic parameters are consistent with Grade I diastolic dysfunction (impaired relaxation). Right Ventricle: The right ventricular size is normal. No increase in right ventricular wall thickness. Right ventricular systolic function is normal. There is normal pulmonary artery systolic pressure. The tricuspid regurgitant velocity is 1.25 m/s, and  with an assumed right atrial pressure of 5 mmHg, the estimated right ventricular systolic pressure is 65.4 mmHg. Left Atrium: Left atrial size was normal in size. Right Atrium: Right atrial size was normal in size. Pericardium: There is no evidence of pericardial effusion. Mitral Valve: The mitral valve is normal in structure. No evidence of mitral valve regurgitation. No evidence of mitral valve stenosis. Tricuspid Valve: The tricuspid valve is normal in structure. Tricuspid valve regurgitation is not demonstrated. No evidence of tricuspid stenosis. Aortic Valve: The aortic valve has an indeterminant number of cusps. Aortic valve regurgitation is not visualized. No aortic stenosis is present. Aortic valve mean gradient measures 2.0 mmHg. Aortic valve peak gradient measures 3.5 mmHg. Aortic valve area, by VTI measures 2.63 cm. Pulmonic Valve: The pulmonic valve was normal in structure. Pulmonic valve regurgitation is not visualized. No evidence of pulmonic stenosis. Aorta: The aortic root is normal in size and structure. Venous: The inferior vena cava is normal in size with greater than 50% respiratory variability, suggesting right atrial pressure of 3 mmHg. IAS/Shunts: No atrial level shunt detected by color flow Doppler.  LEFT VENTRICLE PLAX 2D LVIDd:         4.50 cm   Diastology LVIDs:         3.10 cm   LV e' medial:     7.40 cm/s LV PW:         1.20 cm   LV E/e' medial:  8.7 LV IVS:        1.10 cm   LV e' lateral:   5.22 cm/s LVOT diam:     2.00 cm   LV E/e' lateral: 12.4 LV SV:         44 LV SV Index:   20 LVOT Area:     3.14 cm  RIGHT VENTRICLE RV Basal diam:  3.90 cm RV Mid diam:    2.80 cm RV S prime:     9.14 cm/s LEFT ATRIUM             Index        RIGHT ATRIUM           Index LA diam:        3.10 cm 1.39 cm/m   RA Area:     18.10 cm LA Vol (A2C):   46.6 ml 20.93 ml/m  RA Volume:   50.30 ml  22.59 ml/m LA Vol (A4C):   29.7 ml 13.34 ml/m LA Biplane Vol: 39.4 ml 17.70 ml/m  AORTIC VALVE AV Area (Vmax):    2.46 cm AV Area (Vmean):   2.29 cm AV Area (VTI):     2.63 cm AV Vmax:           93.00 cm/s AV Vmean:          61.000 cm/s AV VTI:            0.167 m AV Peak Grad:      3.5 mmHg AV Mean Grad:      2.0 mmHg LVOT Vmax:  72.80 cm/s LVOT Vmean:        44.500 cm/s LVOT VTI:          0.140 m LVOT/AV VTI ratio: 0.84  AORTA Ao Root diam: 2.70 cm MITRAL VALVE               TRICUSPID VALVE MV Area (PHT): 3.54 cm    TR Peak grad:   6.2 mmHg MV Decel Time: 214 msec    TR Vmax:        125.00 cm/s MV E velocity: 64.50 cm/s MV A velocity: 82.30 cm/s  SHUNTS MV E/A ratio:  0.78        Systemic VTI:  0.14 m                            Systemic Diam: 2.00 cm Ida Rogue MD Electronically signed by Ida Rogue MD Signature Date/Time: 04/23/2022/2:41:49 PM    Final    US Carotid Bilateral  Result Date: 04/23/2022 CLINICAL DATA:  Stroke EXAM: BILATERAL CAROTID DUPLEX ULTRASOUND TECHNIQUE: Pearline Cables scale imaging, color Doppler and duplex ultrasound were performed of bilateral carotid and vertebral arteries in the neck. COMPARISON:  None Available. FINDINGS: Criteria: Quantification of carotid stenosis is based on velocity parameters that correlate the residual internal carotid diameter with NASCET-based stenosis levels, using the diameter of the distal internal carotid lumen as the denominator for stenosis measurement. The  following velocity measurements were obtained: RIGHT ICA: 66/24 cm/sec CCA: 02/58 cm/sec SYSTOLIC ICA/CCA RATIO:  1.4 ECA: 87 cm/sec LEFT ICA: 70/13 cm/sec CCA: 52/77 cm/sec SYSTOLIC ICA/CCA RATIO:  1 ECA: 87 cm/sec RIGHT CAROTID ARTERY: Patent with normal color flow. No significant mural thrombus or calcification. RIGHT VERTEBRAL ARTERY:  Patent with antegrade flow. LEFT CAROTID ARTERY: Patent with normal color flow. No significant mural thrombus or calcification. LEFT VERTEBRAL ARTERY:  Patent with antegrade flow IMPRESSION: No evidence of hemodynamic significant stenosis of either right or the left internal carotid artery. Electronically Signed   By: Lucienne Capers M.D.   On: 04/23/2022 00:56   CT HEAD CODE STROKE WO CONTRAST  Result Date: 04/22/2022 CLINICAL DATA:  Code stroke. Right-sided weakness. Severe headache. EXAM: CT HEAD WITHOUT CONTRAST TECHNIQUE: Contiguous axial images were obtained from the base of the skull through the vertex without intravenous contrast. RADIATION DOSE REDUCTION: This exam was performed according to the departmental dose-optimization program which includes automated exposure control, adjustment of the mA and/or kV according to patient size and/or use of iterative reconstruction technique. COMPARISON:  01/13/22 CT Brain FINDINGS: Brain: No evidence of acute infarction, hemorrhage, hydrocephalus, extra-axial collection or mass lesion/mass effect. Vascular: Possible hyperdensity at the right ICA terminus. Skull: Normal. Negative for fracture or focal lesion. Sinuses/Orbits: No acute finding.  Bilateral lens replacements. Other: None. ASPECTS ([place] Stroke Program Early CT Score): 10 IMPRESSION: 1. No hemorrhage or CT evidence of an infarct.  Aspects is 10. 2. Possible hyperdensity at the right ICA terminus, which could be artifactual but could also be seen in the setting of acute thrombus. Findings were communicated to Dr. Curly Shores on 04/22/22 at 4:13 PM via the [facility] paging  system. Electronically Signed   By: Marin Roberts M.D.   On: 04/22/2022 16:17    Microbiology: Results for orders placed or performed during the hospital encounter of 04/22/22  MRSA Next Gen by PCR, Nasal     Status: None   Collection Time: 04/23/22  6:11 PM   Specimen: Nasal Mucosa; Nasal  Swab  Result Value Ref Range Status   MRSA by PCR Next Gen NOT DETECTED NOT DETECTED Final    Comment: (NOTE) The GeneXpert MRSA Assay (FDA approved for NASAL specimens only), is one component of a comprehensive MRSA colonization surveillance program. It is not intended to diagnose MRSA infection nor to guide or monitor treatment for MRSA infections. Test performance is not FDA approved in patients less than 28 years old. Performed at [facility], [address] [place], [place] [address]     Labs: CBC: Recent Labs  Lab 04/22/22 1604 04/23/22 0515 04/24/22 0501  WBC 7.4 5.9 6.2  NEUTROABS 5.5  --   --   HGB 16.0 13.6 14.0  HCT 45.1 40.1 41.3  MCV 89.1 92.4 92.0  PLT 209 162 481   Basic Metabolic Panel: Recent Labs  Lab 04/22/22 1604 04/23/22 0515 04/24/22 0501  NA 136 139 137  K 3.2* 4.5 4.4  CL 105 110 107  CO2 '23 24 25  '$ GLUCOSE 173* 88 110*  BUN '18 15 20  '$ CREATININE 1.81* 1.69* 1.80*  CALCIUM 9.4 8.5* 8.6*  MG  --  1.8 2.1  PHOS  --  3.1 3.8   Liver Function Tests: Recent Labs  Lab 04/22/22 1604  AST 24  ALT 23  ALKPHOS 66  BILITOT 0.9  PROT 7.1  ALBUMIN 3.7   CBG: Recent Labs  Lab 04/23/22 1914 04/23/22 2309 04/24/22 0323 04/24/22 0731 04/24/22 1136  GLUCAP 115* 130* 134* 108* 121*    Discharge time spent: greater than 30 minutes.  Signed: Loletha Grayer, MD [facility] 04/24/2022

## 2022-04-24 NOTE — Assessment & Plan Note (Signed)
Outpatient PT follow-up

## 2022-04-24 NOTE — Progress Notes (Addendum)
Patient education reviewed and all questions answered. Belongings packed and all personal belongings removed from room. Patient verbalized all belongings he brought are leaving with him. Wife to pick up patient at [facility]. Patient called wife himself to obtain ride home. Volunteer to assist patient in wheelchair down to medical mall. This RN reached out to OT to ensure patient will get rolling walker delivered to home. Both PIV removed. Vitals WDL at time of discharge. Pain level 1 out of 10.

## 2022-04-24 NOTE — Assessment & Plan Note (Signed)
Patient having pain in the area of right ear and tympanic membrane is erythematous.  Given 1 dose of Unasyn and will prescribe Augmentin upon going home.

## 2022-04-24 NOTE — Progress Notes (Signed)
Neurology Progress Note   Major interval events/Subjective: -Continues to have some headache but is much improved, speech is much improved, right-sided function remains much improved  Exam: Vitals:   04/24/22 0614 04/24/22 0805  BP:  129/78  Pulse: 71 69  Resp: 15 (!) 24  Temp:  98.3 F (36.8 C)  SpO2: 98% 90%   Gen: In bed, comfortable, sitting up on the side and about to perform some oral care Resp: non-labored breathing, no grossly audible wheezing Cardiac: Perfusing extremities well  Abd: soft, nt  Neuro:  Awake, alert, fluent speech, following commands, oriented Motor: Using bilateral upper extremities spontaneously and equally.  Good truncal control sitting on side of bed, without any lean in any direction.   Pertinent Labs:  Basic Metabolic Panel: Recent Labs  Lab 04/22/22 1604 04/23/22 0515 04/24/22 0501  NA 136 139 137  K 3.2* 4.5 4.4  CL 105 110 107  CO2 '23 24 25  '$ GLUCOSE 173* 88 110*  BUN '18 15 20  '$ CREATININE 1.81* 1.69* 1.80*  CALCIUM 9.4 8.5* 8.6*  MG  --  1.8 2.1  PHOS  --  3.1 3.8     CBC: Recent Labs  Lab 04/22/22 1604 04/23/22 0515 04/24/22 0501  WBC 7.4 5.9 6.2  NEUTROABS 5.5  --   --   HGB 16.0 13.6 14.0  HCT 45.1 40.1 41.3  MCV 89.1 92.4 92.0  PLT 209 162 154     Coagulation Studies: Recent Labs    04/22/22 1604 04/23/22 0515  LABPROT 13.2 14.4  INR 1.0 1.1     Lab Results  Component Value Date   CHOL 187 04/23/2022   HDL 33 (L) 04/23/2022   LDLCALC 138 (H) 04/23/2022   TRIG 79 04/23/2022   CHOLHDL 5.7 04/23/2022   Lab Results  Component Value Date   HGBA1C 6.1 (H) 04/23/2022   MRI brain: No acute intracranial process. No evidence of acute or subacute infarct.  MRI c-spine 1. Status post ACDF C4-C7 with posterior decompression C3-C7. No spinal canal stenosis. 2. Mild right neural foraminal narrowing at C2-C3, C3-C4, and C5-C6. 3. Unchanged chronic myelomalacia at C4-C5. No new abnormal signal in the  cervical spinal cord.   Impression: Continue to favor a functional neurologic disorder given persistent functional qualities on examination, though underlying stroke could remain on the differential.  MRI brain and cervical spine are negative for any acute process and personally reviewed by myself  Long discussion with patient and wife at bedside that pain is a multifactorial process  Recommendations:  #Functional neurological disorder -Patient misplaced the handout I had provided him from neurosymptoms.org.  I provided his wife a card with referral to the website  #Minor nonspecific chronic white matter changes concerning for small vessel disease -Atorvastatin 40 mg daily for goal LDL less than 70 (presently 138) -A1c meeting goal -Resume home ASA 81 mg daily for mild microvascular disease on MRI brain  #Cervicogenic headache -Explained to patient that I will defer to his outpatient providers for his chronic headache management -Pain management clinic referral -Can consider acupuncture on an outpatient basis -Can consider biofeedback for the psychiatric component -Continued outpatient follow-up with Dr. Manuella Ghazi -No further inpatient neurological work-up   Lesleigh Noe MD-PhD [facility] [phone]  [facility] coverage for [facility] is from 8 AM to 4 AM in-house and 4 PM to 8 PM by telephone/video. 8 PM to 8 AM emergent questions or overnight urgent questions should be addressed to Teleneurology On-call or [facility]  neurohospitalist; contact information can be found on AMION  Greater than 50 minutes were spent in care of this patient the majority at bedside in discussion of functional neurological disorder, cervicogenic headache, need for addition of statin etc.

## 2022-04-24 NOTE — Assessment & Plan Note (Signed)
Continue omeprazole 

## 2022-04-24 NOTE — Assessment & Plan Note (Signed)
LDL 138 and goal less than 70.  Crestor prescribed.

## 2022-04-24 NOTE — Progress Notes (Signed)
[place] for Electrolyte Monitoring and Replacement   Recent Labs: Potassium (mmol/L)  Date Value  04/24/2022 4.4   Magnesium (mg/dL)  Date Value  04/24/2022 2.1   Calcium (mg/dL)  Date Value  04/24/2022 8.6 (L)   Albumin (g/dL)  Date Value  04/22/2022 3.7  04/30/2020 3.9   Phosphorus (mg/dL)  Date Value  04/24/2022 3.8   Sodium (mmol/L)  Date Value  04/24/2022 137  04/30/2020 139   Assessment: Patient is a 56 y/o M with medical history including TIA, diverticulitis, prediabetes, TBI, anxiety / depression, HLD who presented to the ED 10/25 as code stroke and received TNK. Patient disposition is ICU.   Goal of Therapy:  Electrolytes within normal limits  Plan:  --no electrolyte replacement warranted for today --Follow-up electrolytes with AM labs tomorrow  OSLO HUNTSMAN 04/24/2022 7:05 AM

## 2022-04-24 NOTE — Assessment & Plan Note (Signed)
-   Continue BuSpar 

## 2022-04-24 NOTE — Assessment & Plan Note (Signed)
Continue Zoloft 

## 2022-04-24 NOTE — Assessment & Plan Note (Signed)
Neurology recommended an outpatient follow-up.  Can consider pain management, acupuncture or biofeedback.  I did prescribe a dose of Solu-Medrol here which seemed to help his headache so I will give a prednisone taper upon going home.  I did prescribe a few pain pills.  Sedimentation rate was 16 and so this is not temporal arteritis.

## 2022-04-24 NOTE — Assessment & Plan Note (Addendum)
Seen by neurology here and recommended outpatient neurological follow-up.  Initially was thought to have a stroke and was given TNK.  MRI of the brain negative.  Neurology did not recommend statin and aspirin.  Physical therapy recommended outpatient PT.

## 2022-04-24 NOTE — Evaluation (Addendum)
Physical Therapy Evaluation Patient Details Name: Randy Dean MRN: [id number] DOB: 1966/04/28 Today's Date: 04/24/2022  History of Present Illness  Pt is a 56 y/o M admitted on 04/22/22 after presenting with c/o severe HA, neck pain, R sided weakness, & slurred speech. Pt received TNK. Head imaging was negative for acute intracranial process. PMH: stroke vs TIA vs functional episode vs seizure, cervical injury s/p instrumentation, PTSD, chronic cervicogenic HA, anxiety, CKD3b  Clinical Impression  Pt seen for PT evaluation with pt's wife Lattie Haw) present for session. Prior to admission pt was living with his wife who works out of the home 3 days/week in a 1 level home with a few steps with rail to enter. Pt was ambulatory with SPC but endorses falls 2/2 BLE buckling. On this date, pt is independent with bed mobility & STS. Pt requests to ambulate with RW & ambulates increased distances with AD & supervision with PRN standing rest breaks. Anticipate pt can d/c home with OPPT f/u & RW. Will continue to follow pt acutely to address balance, strengthening, and gait with LRAD.  BP checked in RUE: Supine: 137/80 mmHg (MAP 96), HR 67 bpm Sitting: 148/88 mmHg (MAP 104), HR 67 bpm Standing at 0: 147/87 mmHg (MAP 103), HR 76 bpm Standing at end of session: 137/87 mmHg (MAP 95), HR 59 bpm  Pt endorses dizziness with positional changes, but notes it does not worsen throughout session. No obvious nystagmus observed.    Recommendations for follow up therapy are one component of a multi-disciplinary discharge planning process, led by the attending physician.  Recommendations may be updated based on patient status, additional functional criteria and insurance authorization.  Follow Up Recommendations Outpatient PT      Assistance Recommended at Discharge PRN  Patient can return home with the following  Help with stairs or ramp for entrance;Assistance with cooking/housework    Equipment  Recommendations Rolling walker (2 wheels)  Recommendations for Other Services       Functional Status Assessment Patient has had a recent decline in their functional status and demonstrates the ability to make significant improvements in function in a reasonable and predictable amount of time.     Precautions / Restrictions Precautions Precautions: Fall Restrictions Weight Bearing Restrictions: No      Mobility  Bed Mobility Overal bed mobility: Independent, Modified Independent             General bed mobility comments: supine>sit with independence    Transfers Overall transfer level: Independent                 General transfer comment: STS from EOB without AD, cuing for safe hand placement when transferring STS with RW    Ambulation/Gait Ambulation/Gait assistance: Supervision Gait Distance (Feet): 200 Feet Assistive device: Rolling walker (2 wheels) Gait Pattern/deviations: Decreased step length - right, Decreased step length - left, Decreased stride length Gait velocity: decreased     General Gait Details: BLE externally rotated, PRN standing rest breaks  Stairs            Wheelchair Mobility    Modified Rankin (Stroke Patients Only)       Balance Overall balance assessment: Needs assistance Sitting-balance support: Feet supported Sitting balance-Leahy Scale: Good     Standing balance support: Bilateral upper extremity supported Standing balance-Leahy Scale: Good  Pertinent Vitals/Pain Pain Assessment Pain Assessment: 0-10 Pain Score: 4  Pain Location: HA, low back (R side0 Pain Descriptors / Indicators: Headache, Discomfort Pain Intervention(s): Monitored during session, RN gave pain meds during session    [place] expects to be discharged to:: Private residence Living Arrangements: Spouse/significant other Available Help at Discharge: Family;Available  PRN/intermittently (wife works out of the home 3 days/week) Type of Home: House Home Access: Stairs to enter Entrance Stairs-Rails: Right Entrance Stairs-Number of Steps: 2-3   Home Layout: One level Home Equipment: Shower seat - built in;Rollator (4 wheels);Cane - single point;Crutches;Hand held shower head      Prior Function Prior Level of Function : Independent/Modified Independent;Driving;History of Falls (last six months)             Mobility Comments: pt ambulating with SPC but does endorse need to use something better but he's just been too "stubborn" to switch, 3-4 falls in past 2-3 wks 2/2 bilat knee buckling with exertion ADLs Comments: indep with ADL and IADL     Hand Dominance   Dominant Hand: Right    Extremity/Trunk Assessment   Upper Extremity Assessment Upper Extremity Assessment: Overall WFL for tasks assessed RUE Deficits / Details: grossly WFL with isolated strength testing, some hx of sensation deficits to R hand leading to mild impairments in [facility] but functionally able to compensate RUE Sensation: decreased light touch RUE Coordination: decreased fine motor    Lower Extremity Assessment Lower Extremity Assessment: Generalized weakness RLE Deficits / Details: Pt endorses generalized R sided weakness following old CVA, reports weakness has been more pronounced since this episode. No focal deficits noted, generalized weakness observed.    Cervical / Trunk Assessment Cervical / Trunk Assessment: Neck Surgery  Communication   Communication: Other (comment) (pt reports hx of PRN word finding difficulty from old CVA)  Cognition Arousal/Alertness: Awake/alert Behavior During Therapy: WFL for tasks assessed/performed Overall Cognitive Status: Within Functional Limits for tasks assessed                                 General Comments: pt endorses hx of STM deficits since previous CVA        General Comments      Exercises      Assessment/Plan    PT Assessment Patient needs continued PT services  PT Problem List Decreased strength;Decreased activity tolerance;Decreased balance;Decreased mobility;Decreased knowledge of use of DME       PT Treatment Interventions DME instruction;Therapeutic exercise;Gait training;Balance training;Neuromuscular re-education;Stair training;Functional mobility training;Therapeutic activities;Patient/family education    PT Goals (Current goals can be found in the Care Plan section)  Acute Rehab PT Goals Patient Stated Goal: pain management PT Goal Formulation: With patient/family Time For Goal Achievement: 05/08/22 Potential to Achieve Goals: Good    Frequency Min 2X/week     Co-evaluation               AM-PAC PT "6 Clicks" Mobility  Outcome Measure Help needed turning from your back to your side while in a flat bed without using bedrails?: None Help needed moving from lying on your back to sitting on the side of a flat bed without using bedrails?: None Help needed moving to and from a bed to a chair (including a wheelchair)?: A Little Help needed standing up from a chair using your arms (e.g., wheelchair or bedside chair)?: None Help needed to walk in hospital room?: A Little Help needed  climbing 3-5 steps with a railing? : A Little 6 Click Score: 21    End of Session   Activity Tolerance: Patient tolerated treatment well Patient left: with family/visitor present (sitting EOB) Nurse Communication: Mobility status PT Visit Diagnosis: Unsteadiness on feet (R26.81);Muscle weakness (generalized) (M62.81);History of falling (Z91.81)    Time: 5927-6394 PT Time Calculation (min) (ACUTE ONLY): 20 min   Charges:   PT Evaluation $PT Eval Moderate Complexity: [place], PT, DPT 04/24/22, 11:39 AM   Waunita Schooner 04/24/2022, 11:36 AM

## 2022-04-24 NOTE — TOC Transition Note (Signed)
Transition of Care [facility]) - CM/SW Discharge Note   Patient Details  Name: Randy Dean MRN: [id number] Date of Birth: 05/10/1966  Transition of Care [facility]) CM/SW Contact:  Shelbie Hutching, RN Phone Number: 04/24/2022, 3:38 PM   Clinical Narrative:    Patient medically stable for discharge home today.  Patient has a cane at home, PT is recommending a RW.  RW will be delivered to the home, order has been given to [employer].  Outpatient PT recommended, referral sent to [facility] OP therapy department.  Wife to transport patient home.     Final next level of care: OP Rehab Barriers to Discharge: Barriers Resolved   Patient Goals and CMS Choice        Discharge Placement                       Discharge Plan and Services                DME Arranged: Walker rolling DME Agency: [employer] Date DME Agency Contacted: 04/24/22 Time DME Agency Contacted: 1500 Representative spoke with at DME Agency: Suanne Marker            Social Determinants of Health ([place]) Interventions     Readmission Risk Interventions     No data to display

## 2022-12-21 ENCOUNTER — Other Ambulatory Visit: Payer: Self-pay

## 2022-12-21 DIAGNOSIS — R350 Frequency of micturition: Secondary | ICD-10-CM

## 2022-12-22 ENCOUNTER — Ambulatory Visit: Payer: Medicare HMO | Admitting: Urology

## 2022-12-22 ENCOUNTER — Encounter: Payer: Self-pay | Admitting: Urology

## 2022-12-22 VITALS — BP 139/78 | HR 67 | Ht 71.0 in | Wt 223.0 lb

## 2022-12-22 DIAGNOSIS — R350 Frequency of micturition: Secondary | ICD-10-CM | POA: Diagnosis not present

## 2022-12-22 DIAGNOSIS — R399 Unspecified symptoms and signs involving the genitourinary system: Secondary | ICD-10-CM

## 2022-12-22 DIAGNOSIS — Z125 Encounter for screening for malignant neoplasm of prostate: Secondary | ICD-10-CM

## 2022-12-22 LAB — BLADDER SCAN AMB NON-IMAGING

## 2022-12-22 NOTE — Patient Instructions (Signed)
We will set you up for a cystoscopy and prostate ultrasound to evaluate the cause of your urinary symptoms, and see what options we might have to improve your urinary symptoms besides changing medications.  Also strongly recommend cutting back on soda, as this often causes urinary urgency and frequency, as well as getting up overnight to pee.  Cut back on fluid intake 4 hours prior to bedtime, and urinate right before going to bed to help improve overnight urination.    Cystoscopy Cystoscopy is a procedure that is used to help diagnose and sometimes treat conditions that affect the lower urinary tract. The lower urinary tract includes the bladder and the urethra. The urethra is the tube that drains urine from the bladder. Cystoscopy is done using a thin, tube-shaped instrument with a light and camera at the end (cystoscope). The cystoscope may be hard or flexible, depending on the goal of the procedure. The cystoscope is inserted through the urethra, into the bladder. Cystoscopy may be recommended if you have: Urinary tract infections that keep coming back. Blood in the urine (hematuria). An inability to control when you urinate (urinary incontinence) or an overactive bladder. Unusual cells found in a urine sample. A blockage in the urethra, such as a urinary stone. Painful urination. An abnormality in the bladder found during an intravenous pyelogram (IVP) or CT scan. What are the risks? Generally, this is a safe procedure. However, problems may occur, including: Infection. Bleeding.  What happens during the procedure?  You will be given one or more of the following: A medicine to numb the area (local anesthetic). The area around the opening of your urethra will be cleaned. The cystoscope will be passed through your urethra into your bladder. Germ-free (sterile) fluid will flow through the cystoscope to fill your bladder. The fluid will stretch your bladder so that your health care  provider can clearly examine your bladder walls. Your doctor will look at the urethra and bladder. The cystoscope will be removed The procedure may vary among health care providers  What can I expect after the procedure? After the procedure, it is common to have: Some soreness or pain in your urethra. Urinary symptoms. These include: Mild pain or burning when you urinate. Pain should stop within a few minutes after you urinate. This may last for up to a few days after the procedure. A small amount of blood in your urine for several days. Feeling like you need to urinate but producing only a small amount of urine. Follow these instructions at home: General instructions Return to your normal activities as told by your health care provider.  Drink plenty of fluids after the procedure. Keep all follow-up visits as told by your health care provider. This is important. Contact a health care provider if you: Have pain that gets worse or does not get better with medicine, especially pain when you urinate lasting longer than 72 hours after the procedure. Have trouble urinating. Get help right away if you: Have blood clots in your urine. Have a fever or chills. Are unable to urinate. Summary Cystoscopy is a procedure that is used to help diagnose and sometimes treat conditions that affect the lower urinary tract. Cystoscopy is done using a thin, tube-shaped instrument with a light and camera at the end. After the procedure, it is common to have some soreness or pain in your urethra. It is normal to have blood in your urine after the procedure.  If you were prescribed an antibiotic medicine, take  it as told by your health care provider.  This information is not intended to replace advice given to you by your health care provider. Make sure you discuss any questions you have with your health care provider. Document Revised: 06/07/2018 Document Reviewed: 06/07/2018 Elsevier Patient Education  2020  [employer].

## 2022-12-22 NOTE — Progress Notes (Signed)
12/22/22 10:58 AM   Randy Dean [birth year] [id number]  CC: Lower urinary tract symptoms, weak stream, PSA screening  HPI: Comorbid 57 year old male with a number of medical issues including diabetes(HgA1c 7.8) who presents with worsening urinary symptoms with weak stream, urgency during bowel movements, sensation of incomplete emptying, straining with urination, nocturia 3-4 times overnight.  He was recently started on Flomax by PCP which seems to have improved the urination at least somewhat.  He drinks 2 [facility] during the Dean.  Denies any gross hematuria or UTIs.  Recent PSA from May 2024 was normal at 2.71.  Urinalysis benign, PVR normal at 60ml.  PMH: Past Medical History:  Diagnosis Date   Abnormality of gait 10/30/2015   Allergic rhinitis    Anxiety    Arthritis    Asthma    well controlled   BPH (benign prostatic hyperplasia)    Chronic headaches    partially sinus/partially brain injury (per pt)   Claustrophobia    Complication of anesthesia    hard to wake up after 1 surgery but pt had had 3surgeries close together   Dyspnea    GERD (gastroesophageal reflux disease)    Head trauma 2010   has caused anxiety, memory issues and neck problems   Headache 10/30/2015   Right temporal    Ischemic stroke (HCC) 06/2019   Kidney damage    due to years of taking meloxicam after head injury   Neck stiffness    metal plate.  mild limitations of side to side and up and dowm movement   Prediabetes    PTSD (post-traumatic stress disorder)    RAD (reactive airway disease)    Spinal cord compression (HCC)    -cervical from brain trauma   Stage 3b chronic kidney disease (HCC)    Stroke (HCC)    TIA x3- some residual right side weakness   Vertigo    Weakness of right side of body    from TIAs    Surgical History: Past Surgical History:  Procedure Laterality Date   BACK SURGERY  1996   spinal surgery and neck 2x   COLON SURGERY  05/26/2021   due to  Diverticulitis   COLONOSCOPY WITH PROPOFOL N/A 03/02/2019   Procedure: COLONOSCOPY WITH PROPOFOL;  Surgeon: Sung Amabile, DO;  Location: [facility] ENDOSCOPY;  Service: General;  Laterality: N/A;   DIAGNOSTIC LAPAROSCOPY     ENDOSCOPIC TURBINATE REDUCTION Bilateral 08/22/2015   Procedure: ENDOSCOPIC TURBINATE REDUCTION;  Surgeon: Vernie Murders, MD;  Location: [facility];  Service: ENT;  Laterality: Bilateral;   EYE MUSCLE SURGERY Left    Due to conential left amblyopia x2   FINGER FRACTURE SURGERY Right    index finger   INSERTION OF MESH  10/24/2021   Procedure: INSERTION OF MESH;  Surgeon: Sung Amabile, DO;  Location: [facility] ORS;  Service: General;;   NASAL SINUS SURGERY  02/28/2015   NECK SURGERY     x3-metal plate in neck   SHOULDER SURGERY Right 1990     Family History: Family History  Problem Relation Age of Onset   Fibromyalgia Mother    Heart disease Father    Tremor Father    Diabetes Maternal Grandmother    Hypertension Maternal Grandmother    Hypertension Maternal Grandfather    Diabetes Paternal Grandmother    Hypertension Paternal Grandmother    Hypertension Paternal Grandfather     Social History:  reports that he quit smoking about 38 years ago. His smoking  use included cigarettes. He has a 1.50 pack-year smoking history. He quit smokeless tobacco use about 2 years ago. He reports that he does not drink alcohol and does not use drugs.  Physical Exam: BP 139/78 (BP Location: Left Arm, Patient Position: Sitting, Cuff Size: Large)   Pulse 67   Ht 5\' 11"  (1.803 m)   Wt 223 lb (101.2 kg)   BMI 31.10 kg/m    Constitutional:  Alert and oriented, No acute distress. Cardiovascular: No clubbing, cyanosis, or edema. Respiratory: Normal respiratory effort, no increased work of breathing. GI: Abdomen is soft, nontender, nondistended, no abdominal masses  Assessment & Plan:   57 year old male with worsening urinary symptoms of unclear etiology.  He has a number of  comorbidities including diabetes, as well as intake of multiple sodas per Dean that likely contribute to his frequency.  We reviewed possible etiologies including BPH, OAB, urethral stricture, less likely malignancy.  Reassurance provided regarding normal PSA, UA, and PVR.  I recommended cystoscopy and TRUS for further evaluation and consideration of outlet procedures as he has had some improvement on Flomax.  We also reviewed behavioral strategies extensively including minimizing bladder irritants  Legrand Rams, MD 12/22/2022  [facility] [address] [place], [place] [address] [phone]

## 2023-01-21 ENCOUNTER — Encounter: Payer: Medicare HMO | Admitting: Urology

## 2023-01-21 ENCOUNTER — Other Ambulatory Visit
Admission: RE | Admit: 2023-01-21 | Discharge: 2023-01-21 | Disposition: A | Discharge status: Home / Self Care | Payer: Medicare HMO | Source: Ambulatory Visit | Attending: Internal Medicine | Admitting: Internal Medicine

## 2023-01-21 DIAGNOSIS — Z955 Presence of coronary angioplasty implant and graft: Secondary | ICD-10-CM | POA: Diagnosis present

## 2023-01-21 DIAGNOSIS — I509 Heart failure, unspecified: Secondary | ICD-10-CM | POA: Diagnosis not present

## 2023-01-21 LAB — BRAIN NATRIURETIC PEPTIDE: B Natriuretic Peptide: 27 pg/mL (ref 0.0–100.0)

## 2023-02-08 NOTE — Progress Notes (Signed)
chf

## 2023-04-20 ENCOUNTER — Other Ambulatory Visit: Payer: Self-pay | Admitting: Neurology

## 2023-04-20 ENCOUNTER — Ambulatory Visit
Admission: RE | Admit: 2023-04-20 | Discharge: 2023-04-20 | Disposition: A | Discharge status: Home / Self Care | Payer: Medicare HMO | Source: Ambulatory Visit | Attending: Neurology | Admitting: Neurology

## 2023-04-20 DIAGNOSIS — I6340 Cerebral infarction due to embolism of unspecified cerebral artery: Secondary | ICD-10-CM

## 2023-04-22 ENCOUNTER — Other Ambulatory Visit: Payer: Self-pay | Admitting: Neurology

## 2023-04-22 DIAGNOSIS — R0989 Other specified symptoms and signs involving the circulatory and respiratory systems: Secondary | ICD-10-CM

## 2023-08-03 ENCOUNTER — Other Ambulatory Visit: Payer: Self-pay | Admitting: Family Medicine

## 2023-08-03 ENCOUNTER — Ambulatory Visit
Admission: RE | Admit: 2023-08-03 | Discharge: 2023-08-03 | Disposition: A | Discharge status: Home / Self Care | Payer: Medicare Other | Source: Ambulatory Visit | Attending: Family Medicine | Admitting: Family Medicine

## 2023-08-03 DIAGNOSIS — R519 Headache, unspecified: Secondary | ICD-10-CM | POA: Insufficient documentation

## 2023-08-04 ENCOUNTER — Encounter: Payer: Self-pay | Admitting: Family Medicine

## 2023-08-04 DIAGNOSIS — R519 Headache, unspecified: Secondary | ICD-10-CM

## 2023-08-04 DIAGNOSIS — R29898 Other symptoms and signs involving the musculoskeletal system: Secondary | ICD-10-CM

## 2024-01-26 ENCOUNTER — Ambulatory Visit: Admit: 2024-01-26 | Admitting: Surgery

## 2024-01-26 SURGERY — COLONOSCOPY
Anesthesia: General

## 2024-02-25 ENCOUNTER — Other Ambulatory Visit: Payer: Self-pay

## 2024-02-25 ENCOUNTER — Emergency Department

## 2024-02-25 ENCOUNTER — Emergency Department
Admission: EM | Admit: 2024-02-25 | Discharge: 2024-02-25 | Disposition: A | Discharge status: Home / Self Care | Attending: Emergency Medicine | Admitting: Emergency Medicine

## 2024-02-25 DIAGNOSIS — R091 Pleurisy: Secondary | ICD-10-CM | POA: Insufficient documentation

## 2024-02-25 DIAGNOSIS — R079 Chest pain, unspecified: Secondary | ICD-10-CM

## 2024-02-25 DIAGNOSIS — I2510 Atherosclerotic heart disease of native coronary artery without angina pectoris: Secondary | ICD-10-CM | POA: Diagnosis not present

## 2024-02-25 DIAGNOSIS — J069 Acute upper respiratory infection, unspecified: Secondary | ICD-10-CM | POA: Diagnosis not present

## 2024-02-25 DIAGNOSIS — Z7901 Long term (current) use of anticoagulants: Secondary | ICD-10-CM | POA: Insufficient documentation

## 2024-02-25 LAB — CBC
HCT: 45.7 % (ref 39.0–52.0)
Hemoglobin: 15.8 g/dL (ref 13.0–17.0)
MCH: 30.4 pg (ref 26.0–34.0)
MCHC: 34.6 g/dL (ref 30.0–36.0)
MCV: 87.9 fL (ref 80.0–100.0)
Platelets: 196 K/uL (ref 150–400)
RBC: 5.2 MIL/uL (ref 4.22–5.81)
RDW: 12.6 % (ref 11.5–15.5)
WBC: 7.3 K/uL (ref 4.0–10.5)
nRBC: 0 % (ref 0.0–0.2)

## 2024-02-25 LAB — BASIC METABOLIC PANEL WITH GFR
Anion gap: 11 (ref 5–15)
BUN: 22 mg/dL — ABNORMAL HIGH (ref 6–20)
CO2: 22 mmol/L (ref 22–32)
Calcium: 9.7 mg/dL (ref 8.9–10.3)
Chloride: 104 mmol/L (ref 98–111)
Creatinine, Ser: 1.96 mg/dL — ABNORMAL HIGH (ref 0.61–1.24)
GFR, Estimated: 38 mL/min — ABNORMAL LOW (ref >60)
Glucose, Bld: 121 mg/dL — ABNORMAL HIGH (ref 70–99)
Potassium: 3.9 mmol/L (ref 3.5–5.1)
Sodium: 137 mmol/L (ref 135–145)

## 2024-02-25 LAB — D-DIMER, QUANTITATIVE: D-Dimer, Quant: 0.39 ug{FEU}/mL (ref 0.00–0.50)

## 2024-02-25 LAB — TROPONIN I (HIGH SENSITIVITY)
Troponin I (High Sensitivity): 7 ng/L (ref <18)
Troponin I (High Sensitivity): 7 ng/L (ref <18)

## 2024-02-25 MED ORDER — HYDROCOD POLI-CHLORPHE POLI ER 10-8 MG/5ML PO SUER: 5.0000 mL | Freq: Two times a day (BID) | ORAL | 0 refills | Status: AC | PRN

## 2024-02-25 MED ORDER — MIDAZOLAM HCL 2 MG/2ML IJ SOLN
2.0000 mg | Freq: Once | INTRAMUSCULAR | Status: AC
  Administered 2024-02-25: 2 mg via INTRAVENOUS
  Filled 2024-02-25: qty 2

## 2024-02-25 MED ORDER — HYDROCOD POLI-CHLORPHE POLI ER 10-8 MG/5ML PO SUER: 5.0000 mL | Freq: Two times a day (BID) | ORAL | 0 refills | Status: DC | PRN

## 2024-02-25 NOTE — ED Triage Notes (Signed)
 Arrived by [facility] from [facility]. Chest pain since Monday that has worsened. Starts at left breast and radiates to back. Sharp pains and dull pain in left axillary   Stent placed last July. History of three strokes.  Reports history of 50% blockage in another artery.  EMS vitals: 72HR 153/79 b/p 100% RA 168CBG

## 2024-02-25 NOTE — ED Provider Notes (Signed)
 [facility] Provider Note    Event Date/Time   First MD Initiated Contact with Patient 02/25/24 1118     (approximate)  History   Chief Complaint: Chest Pain  HPI  Randy Dean is a 58 y.o. male with a past medical history of CAD, status post stent 1 year ago, history of PE on Xarelto presents to the emergency department for chest pain.  According to the patient since Monday he has been coughing and experiencing chest pain that radiates somewhat to the left chest.  States some nausea but denies any shortness of breath or diaphoresis.  No leg pain or swelling.  No known fever.  Physical Exam   Triage Vital Signs: ED Triage Vitals [02/25/24 1116]  Encounter Vitals Group     BP (!) 152/94     Girls Systolic BP Percentile      Girls Diastolic BP Percentile      Boys Systolic BP Percentile      Boys Diastolic BP Percentile      Pulse Rate 70     Resp (!) 21     Temp 98.1 F (36.7 C)     Temp Source Oral     SpO2 100 %     Weight 215 lb (97.5 kg)     Height 6' (1.829 m)     Head Circumference      Peak Flow      Pain Score 8     Pain Loc      Pain Education      Exclude from Growth Chart     Most recent vital signs: Vitals:   02/25/24 1116 02/25/24 1121  BP: (!) 152/94   Pulse: 70   Resp: (!) 21   Temp: 98.1 F (36.7 C)   SpO2: 100% 100%    General: Awake, no distress.  CV:  Good peripheral perfusion.  Regular rate and rhythm  Resp:  Normal effort.  Equal breath sounds bilaterally.  Abd:  No distention.  Soft, nontender.  No rebound or guarding.   ED Results / Procedures / Treatments   EKG  EKG viewed and interpreted by myself shows a normal sinus rhythm at 74 bpm with a narrow QRS, normal axis, normal intervals, no concerning ST changes.  RADIOLOGY  I have reviewed interpreted chest x-ray images.  No consolidation on my evaluation. Radiology is read no significant finding.  Possible nipple shadow.   MEDICATIONS ORDERED IN  ED: Medications  midazolam  (VERSED ) injection 2 mg (has no administration in time range)     IMPRESSION / MDM / ASSESSMENT AND PLAN / ED COURSE  I reviewed the triage vital signs and the nursing notes.  Patient's presentation is most consistent with acute presentation with potential threat to life or bodily function.  Patient presents emergency department for chest pain.  History of CAD with a prior stent 1 year ago, history of CVA but no neurologic deficits or complaints.  Patient has a history of PE on Xarelto which he denies missing any doses.  We will check labs including troponin and a D-dimer.  Will obtain a chest x-ray.  Patient is very anxious appearing we will dose Versed  while awaiting results.  EKG shows no concerning findings.  Chest x-ray shows no significant finding.  Will have the patient follow-up for repeat chest x-ray in 6 weeks or so with his doctor.  Lab work is reassuring with a normal CBC, reassuring chemistry showing chronic kidney disease largely unchanged  from historical values, troponin negative x 2.  D-dimer is negative.  Patient feels much better.  Highly suspect more pleurisy from the coughing over the past week or so.  We will discharge with a cough medication.  Patient is currently taking Augmentin  prescribed by his doctor.  Will have the patient finish out the antibiotic.  Patient agreeable to plan.  FINAL CLINICAL IMPRESSION(S) / ED DIAGNOSES   Chest pain Upper respiratory infection Pleurisy   Note:  This document was prepared using Dragon voice recognition software and may include unintentional dictation errors.   Dorothyann Drivers, MD 02/25/24 [phone]

## 2024-02-25 NOTE — ED Triage Notes (Signed)
 Pt arrived by EMS. Pt sts that he has been having chest pain that radiates to his back since this AM however pt sts that he has been having chest pain that has been getting worse since Monday. Pt is A/Ox4 at this time.

## 2024-04-26 ENCOUNTER — Other Ambulatory Visit: Payer: Self-pay | Admitting: Neurology

## 2024-04-26 DIAGNOSIS — G8929 Other chronic pain: Secondary | ICD-10-CM

## 2024-05-24 ENCOUNTER — Other Ambulatory Visit: Payer: Self-pay | Admitting: Family Medicine

## 2024-05-24 DIAGNOSIS — M5416 Radiculopathy, lumbar region: Secondary | ICD-10-CM

## 2024-06-16 NOTE — Progress Notes (Addendum)
 "  Referring Physician:  Cleotilde Oneil FALCON, MD [address] [facility] [place],  [place] [address]  Primary Physician:  Randy Oneil FALCON, MD  History of Present Illness: 06/26/2024 [name] is a 58 y.o with a history of CAD s/p sent about 1.5 years ago, PE on Xarelto, DM, CVA in 03/2023,  who here today with a chief complaint of ongoing neck pain and right arm pain. He was seen by Dr. Bluford for this in 2023 and it was not recommended that he have surgery at this time. He was subsequently diagnosed with a stroke in [place] of 2023. In February of 2024 he was diagnosed with a PE and started on Xarelto.  He was seen by Dr. Avanell in January of 2025 with similar symptoms of neck and radiating right arm pain into his thumb that was worse with rotation and extension but relieved with flexion and left sided rotation.  He also endorsed right sided low back pain radiating to his right buttock and posterior thigh at that time and it was discussed to get an updated MRI of his lumbar spine but wanted to hold off on obtaining at that time Today he reports acute worsening of his neck and right shoulder pain that he describes as buzzing and tingling that extends into his right right and pointer finger. He also describes popping and cracking in his neck with rotation that feels like there are rice crispies in his neck. He has pain that extends up into his right scalp as well.  In addition to these symptoms he describes cramping and weakness in his legs with walking for more than a few years and an electric shock type pain that goes into his legs with extension of his neck. He states he had this prior to his initial surgery but it resolved and returned about 3 months ago. He denies any pain that radiates down his back and into his buttock aside from the cramping and he denies any left upper extremities symptoms. He continues to walk with a cane.  In attention to the above symptoms  he reports about 3-4 months of urinary retention and constipation.   Conservative measures:  Physical therapy: Has not participated in. Multimodal medical therapy including regular antiinflammatories:  Tylenol , Buspar , Prednisone  Injections:  08/18/2022: Right C5-6   Past Surgery: C4-C7 ACDF and posterior decompression.  Randy Dean has symptoms concerning for myelopathy.  The symptoms are causing a significant impact on the patient's life.   Review of Systems:  A 10 point review of systems is negative, except for the pertinent positives and negatives detailed in the HPI.  Past Medical History: Past Medical History:  Diagnosis Date   Abnormality of gait 10/30/2015   Allergic rhinitis    Anxiety    Arthritis    Asthma    well controlled   BPH (benign prostatic hyperplasia)    Chronic headaches    partially sinus/partially brain injury (per pt)   Claustrophobia    Complication of anesthesia    hard to wake up after 1 surgery but pt had had 3surgeries close together   Dyspnea    GERD (gastroesophageal reflux disease)    Head trauma 2010   has caused anxiety, memory issues and neck problems   Headache 10/30/2015   Right temporal    Ischemic stroke (HCC) 06/2019   Kidney damage    due to years of taking meloxicam  after head injury   Neck stiffness  metal plate.  mild limitations of side to side and up and dowm movement   Prediabetes    PTSD (post-traumatic stress disorder)    RAD (reactive airway disease)    Spinal cord compression (HCC)    -cervical from brain trauma   Stage 3b chronic kidney disease (HCC)    Stroke (HCC)    TIA x3- some residual right side weakness   Vertigo    Weakness of right side of body    from TIAs    Past Surgical History: Past Surgical History:  Procedure Laterality Date   BACK SURGERY  1996   spinal surgery and neck 2x   COLON SURGERY  05/26/2021   due to Diverticulitis   COLONOSCOPY WITH PROPOFOL  N/A 03/02/2019    Procedure: COLONOSCOPY WITH PROPOFOL ;  Surgeon: Randy Millet, DO;  Location: [facility];  Service: General;  Laterality: N/A;   DIAGNOSTIC LAPAROSCOPY     ENDOSCOPIC TURBINATE REDUCTION Bilateral 08/22/2015   Procedure: ENDOSCOPIC TURBINATE REDUCTION;  Surgeon: Randy Argue, MD;  Location: [facility];  Service: ENT;  Laterality: Bilateral;   EYE MUSCLE SURGERY Left    Due to conential left amblyopia x2   FINGER FRACTURE SURGERY Right    index finger   INSERTION OF MESH  10/24/2021   Procedure: INSERTION OF MESH;  Surgeon: Randy Millet, DO;  Location: [facility] ORS;  Service: General;;   NASAL SINUS SURGERY  02/28/2015   NECK SURGERY     x3-metal plate in neck   SHOULDER SURGERY Right 1990    Allergies: Allergies as of 06/26/2024 - Review Complete 02/25/2024  Allergen Reaction Noted   Montelukast  05/07/2021   Serzone [nefazodone] Swelling 03/14/2013   Ciprofloxacin   08/29/2021   Advair hfa [fluticasone -salmeterol] Other (See Comments) 10/24/2021   Codeine Nausea Only 03/14/2013   Doxycycline  Other (See Comments) 08/17/2018   Effexor [venlafaxine]  07/02/2016   Montelukast sodium  05/07/2021   Other  05/07/2021   Paxil  [paroxetine  hcl]  07/02/2016   T-pa [alteplase] Swelling 07/22/2019   Esomeprazole sodium Rash 01/02/2016    Medications: Outpatient Encounter Medications as of 06/26/2024  Medication Sig   acetaminophen  (TYLENOL ) 650 MG CR tablet Take 650 mg by mouth every 8 (eight) hours as needed for pain.   albuterol  (VENTOLIN  HFA) 108 (90 Base) MCG/ACT inhaler Inhale 2 puffs into the lungs every 6 (six) hours as needed for wheezing or shortness of breath.   amphetamine-dextroamphetamine (ADDERALL XR) 10 MG 24 hr capsule Take 10 mg by mouth daily as needed (prn).   aspirin  EC 81 MG tablet Take 81 mg by mouth daily. Swallow whole.   baclofen (LIORESAL) 10 MG tablet Take 10 mg by mouth 3 (three) times daily.   Blood Glucose Monitoring Suppl (ONE TOUCH ULTRA 2) w/Device  KIT SMARTSIG:1 Each Via Meter As Directed   budesonide -formoterol (SYMBICORT) 160-4.5 MCG/ACT inhaler Inhale 2 puffs into the lungs every evening.   busPIRone  (BUSPAR ) 15 MG tablet Take 1 tablet (15 mg total) by mouth at bedtime as needed (anxiety). (Patient taking differently: Take 15 mg by mouth at bedtime as needed (prn).)   chlorpheniramine-HYDROcodone  (TUSSIONEX) 10-8 MG/5ML Take 5 mLs by mouth every 12 (twelve) hours as needed for cough.   cyanocobalamin (VITAMIN B12) 1000 MCG/ML injection Inject 1,000 mcg into the muscle every 30 (thirty) days.   famotidine  (PEPCID ) 40 MG tablet Take 40 mg by mouth daily.   fexofenadine (ALLEGRA) 180 MG tablet Take 180 mg by mouth daily.   fluticasone  (FLONASE ) 50 MCG/ACT  nasal spray Place 2 sprays into both nostrils 2 (two) times daily.   Fluticasone  Propionate, Inhal, (FLOVENT  DISKUS) 100 MCG/ACT AEPB Inhale 1 puff into the lungs 2 (two) times daily.   Lancets (ONETOUCH DELICA PLUS LANCET30G) MISC    loratadine  (CLARITIN ) 10 MG tablet Take 10 mg by mouth daily.   losartan-hydrochlorothiazide (HYZAAR) 50-12.5 MG tablet Take 1 tablet by mouth daily.   olmesartan-hydrochlorothiazide (BENICAR HCT) 20-12.5 MG tablet Take 1 tablet by mouth daily.   omeprazole (PRILOSEC) 40 MG capsule Take 40 mg by mouth daily.   polyethylene glycol (MIRALAX  / GLYCOLAX ) 17 g packet Take 17 g by mouth daily as needed.   Rimegepant Sulfate  (NURTEC) 75 MG TBDP Take 75 mg by mouth daily as needed (migraine).   rosuvastatin  (CRESTOR ) 20 MG tablet Take 1 tablet (20 mg total) by mouth at bedtime.   sertraline  (ZOLOFT ) 50 MG tablet Take 50 mg by mouth daily.   SSD 1 % cream Apply topically.   tamsulosin (FLOMAX) 0.4 MG CAPS capsule Take 0.4 mg by mouth daily.   XARELTO 20 MG TABS tablet Take 20 mg by mouth daily.   Facility-Administered Encounter Medications as of 06/26/2024  Medication   indocyanine green  (IC-GREEN ) injection 5 mg    Social History: Social History[1]  Family  Medical History: Family History  Problem Relation Age of Onset   Fibromyalgia Mother    Heart disease Father    Tremor Father    Diabetes Maternal Grandmother    Hypertension Maternal Grandmother    Hypertension Maternal Grandfather    Diabetes Paternal Grandmother    Hypertension Paternal Grandmother    Hypertension Paternal Grandfather     Physical Examination: Today's Vitals   06/26/24 0957  BP: 138/80  Weight: 220 lb 3.2 oz (99.9 kg)  PainSc: 3   PainLoc: Leg   Body mass index is 29.86 kg/m.   General: Patient is well developed, well nourished, calm, collected, and in no apparent distress. Attention to examination is appropriate.  Psychiatric: Patient is non-anxious.  Head:  Pupils equal, round, and reactive to light.  ENT:  Oral mucosa appears well hydrated.  Neck:   Supple.    Respiratory: Patient is breathing without any difficulty.  Extremities: No edema.  Vascular: Palpable dorsal pedal pulses.  Skin:   On exposed skin, there are no abnormal skin lesions.  NEUROLOGICAL:     Awake, alert, oriented to person, place, and time.  Speech is clear and fluent. Fund of knowledge is appropriate.   Cranial Nerves: Pupils equal round and reactive to light.  Facial tone is symmetric.  Facial sensation is symmetric.  ROM of spine: limited Palpation of spine: TTP throughout cervical spine  Strength: Side Biceps Triceps Deltoid Interossei Grip Wrist Ext. Wrist Flex.  R 5 5 5 5  4- 5 5  L 5 5 5 5  4- 5 5   Side Iliopsoas Quads Hamstring PF DF EHL  R 5 4- 5 5 5 5   L 5 4- 5 5 5 5    Reflexes are 3+ and symmetric at the biceps, triceps, brachioradialis, patella and achilles.   + Hoffman's  1 beat of clonus on the right  Toes are down-going.   Ambulates with a cane   Medical Decision Making  Imaging: MRI C spine 05/18/24 IMPRESSION:  1. Status post C4-C7 ACDF and posterior decompression surgery.  2.  Focal myelomalacia at C3-C4.  3.  Moderate spinal canal  stenosis at C2-C3 has mildly worsened since the  prior. Moderate  to severe right neuroforaminal narrowing  4.  Moderate right greater than left neuroforaminal narrowing at C3-C4.   Electronically Reviewed by:  Hezzie Bellingham, MD, [facility]  Electronically Reviewed on:  05/19/2024 11:57 AM   I have reviewed the images and concur with the above findings.   Electronically Signed by:  Jayson Snowman, MD  Electronically Signed on:  05/19/2024 12:17 PM   I have personally reviewed the images and agree with the above interpretation.  12/28/23 cervical xrays  FINDINGS:  Regional soft tissues unremarkable.  No aggressive osseus lesion.  Moderate C3-4 and C7-T1 disc height loss.  C4-7 ACDF with bony bridging about all segments.  There is a fracture of  the left-sided screw at C7.   Assessment and Plan: [name] is a pleasant 58 y.o. male with a long standing history of neck pain and radiating right arm pain as well as low back pain. He has a history of myelomalacia for many years that is unchanged on his recent cervical MRI.  He does have moderate stenosis at C2-3 however do not see any significant spinal cord compression at this level.  He does have foraminal stenosis on the right at C2-3 and C3-4 which may explain some of his occipital neuralgia type symptoms and radiating shoulder pain.  I am however most concerned about his recurrent symptoms consistent with a Lhermitte sign which is not explained on his most recent cervical MRI alone. I have placed an order for cervical flexion-extension x-rays to evaluate this further.  I also recommended formal MRIs of his thoracic and lumbar spine to evaluate for compression that explains his lower extremity symptoms.  We did briefly discussed that due to medical complexity if he does not have significant spinal cord compression explaining his symptoms, that we would recommend full conservative management as the risks of surgery in his case are significant.   I have reached out to Dr. Maree to ask for assistance in obtaining MRIs of his thoracic and lumbar spine as he will require full sedation and Dr. Maree has been successful in this endeavor for the patient recently.  Once completed we will contact patient with further plan of care.  I will send him a MyChart message with the results of his x-rays when available.   Thank you for involving me in the care of this patient.   [employer] [facility]      [1]  Social History Tobacco Use   Smoking status: Former    Current packs/day: 0.00    Average packs/day: 0.5 packs/day for 3.0 years (1.5 ttl pk-yrs)    Types: Cigarettes    Start date: 06/28/1981    Quit date: 06/28/1984    Years since quitting: 39.9   Smokeless tobacco: Former    Quit date: 05/26/2020   Tobacco comments:    1 can dip/day  Vaping Use   Vaping status: Never Used  Substance Use Topics   Alcohol use: No    Alcohol/week: 0.0 standard drinks of alcohol   Drug use: No   "

## 2024-06-19 ENCOUNTER — Other Ambulatory Visit: Payer: Self-pay | Admitting: Family Medicine

## 2024-06-19 ENCOUNTER — Inpatient Hospital Stay
Admission: RE | Admit: 2024-06-19 | Discharge: 2024-06-19 | Disposition: A | Payer: Self-pay | Source: Ambulatory Visit | Attending: Neurosurgery | Admitting: Neurosurgery

## 2024-06-19 DIAGNOSIS — Z049 Encounter for examination and observation for unspecified reason: Secondary | ICD-10-CM

## 2024-06-26 ENCOUNTER — Encounter: Payer: Self-pay | Admitting: Neurosurgery

## 2024-06-26 ENCOUNTER — Ambulatory Visit: Admitting: Neurosurgery

## 2024-06-26 ENCOUNTER — Ambulatory Visit

## 2024-06-26 ENCOUNTER — Telehealth: Payer: Self-pay | Admitting: Neurosurgery

## 2024-06-26 VITALS — BP 138/80 | Wt 220.2 lb

## 2024-06-26 DIAGNOSIS — M4714 Other spondylosis with myelopathy, thoracic region: Secondary | ICD-10-CM

## 2024-06-26 DIAGNOSIS — M4712 Other spondylosis with myelopathy, cervical region: Secondary | ICD-10-CM

## 2024-06-26 DIAGNOSIS — M545 Low back pain, unspecified: Secondary | ICD-10-CM | POA: Diagnosis not present

## 2024-06-26 DIAGNOSIS — R531 Weakness: Secondary | ICD-10-CM | POA: Diagnosis not present

## 2024-06-26 DIAGNOSIS — M4802 Spinal stenosis, cervical region: Secondary | ICD-10-CM | POA: Diagnosis not present

## 2024-06-26 DIAGNOSIS — M542 Cervicalgia: Secondary | ICD-10-CM

## 2024-06-26 DIAGNOSIS — Z981 Arthrodesis status: Secondary | ICD-10-CM

## 2024-06-26 DIAGNOSIS — G8929 Other chronic pain: Secondary | ICD-10-CM | POA: Diagnosis not present

## 2024-06-26 NOTE — Telephone Encounter (Signed)
 The patient wanted to confirm whether any follow-up is required at our office. Additionally, the patient reported experiencing dizziness when looking upward. They are unsure if this symptom is related to their current condition but wanted to make us  aware.

## 2024-06-27 NOTE — Addendum Note (Signed)
 Addended by: GREGORY EDSEL CROME on: 06/27/2024 11:55 AM   Modules accepted: Orders

## 2024-07-03 NOTE — Progress Notes (Signed)
 "  [facility]  [place]-  RETURN VISIT   PRIMARY CARE PROVIDER:   Cleotilde Oneil Novel, MD [address] [facility] [address] [facility] [place] [address] [phone]                 Randy Dean, Randy Dean 07/03/2024  DOB: [birth year] Age: 59 y.o.   PRIMARY CARDIOLOGIST : Ozell Hays, MD   HPI   PATIENT PROFILE: Randy Dean  is a 59 y.o. male with CAD s/p PCI, PE 07/2022, HTN, DM, CVA, CKD III, COPD who presents for evaluation and management of the following problems:  1. Coronary artery disease involving native coronary artery of native heart without angina pectoris   2. Primary hypertension   3. DOE (dyspnea on exertion)   4. CKD (chronic kidney disease) stage 4, GFR 15-29 ml/min (CMS/HHS-HCC)        CLINICAL SUMMARY: Interval Histories copied/summarized from previous clinic notes:  INTERVAL HISTORY: Marily)  History of Present Illness The patient, with a history of coronary disease and recent percutaneous coronary intervention (PCI) in July, presents with persistent chest pain and shortness of breath. The chest pain is described as deep, constant, and located in the chest area, radiating to the neck. The pain intensity varies, with a maximum of 7 out of 10. The patient reports that the pain is present throughout the day, with no specific triggers identified. The pain is not consistently associated with exertion, and it can occur at rest. Seems to get better if he stretches his L arm up or stretches his rib cage, does not fully resolve. The patient also experiences shortness of breath, which is sometimes associated with the chest pain, but more constant. The shortness of breath is exacerbated by physical activity, such as walking or carrying groceries, leading to frequent rest breaks. The patient also reports episodes of lightheadedness, which can occur at rest or with changes in position. The patient has a history of spinal cord injury (C4-C7), which has  been associated with worsening symptoms over time. The patient also reports a history of mini-strokes and a blood clot that traveled to the lungs, for which he is on blood thinners. He has a history of sensitivities to several medications.  Of note, he went to the ER after his recent CPX and while having active chest pain, he had normal troponins and D-dimer and no ECG changes.    Physical activity: limited Medication Compliance: good  07/04/23 He is intolerant of amlodipine with joint pains.  Stopped it and pains are better.   Notes HR is occasionally < 60.  Mostly 60-80.  No falls other than cat tripped.  Has reflux but no exertional CP.  Has edema in ankles to knees.  No orthopnea or PND.  Has stable DOE.  DOE at 400 ft.  Legs limit more than legs.  Leg limitations are spinal cord related.  Only issue meds is amlodipine.  Is still on Prasugrel.  Stent 1.5 years ago. Had SOB and CP.  Both worse.    Severe 90% R-PDA stenosis, large branch vessel s/p Xience 2.5 x 18 mm DES deployed at high pressure  Mild-moderate CAD in the LAD  Minimal CAD in the Lcx  Normal LVEDP on left heart catheterization: LVEDP 6 mm Hg   The right was the cause of symptoms. The LAD is stable from before.  The low LVEDP = heart filing pressure - suggests very normal heart function.   LDL 102 on repatha with a few days  of joint pain.  He does not recall zetia.    He notes TIA gone with reduced dose xarelto     HISTORY   PROBLEM LIST: Problem List  Date Reviewed: 06/06/2024        ICD-10-CM Priority Class Noted - Resolved Diagnosed   Coronary artery disease involving native coronary artery of native heart I25.10   01/08/2023 - Present    S/P drug eluting coronary stent placement Z95.5   01/08/2023 - Present    Overview Signed 01/21/2023  7:41 AM by Cleotilde Oneil Novel, MD  PCI of PDA, 7/24      Acute pulmonary embolism without acute cor pulmonale (CMS/HHS-HCC) I26.99   09/04/2022 - Present    Overview Addendum  09/04/2022  9:30 AM by Cleotilde Oneil Novel, MD  Right lower lobe, 2/24, no strain, Xarelto      Hypercoagulable state (HHS-HCC) D68.59   09/04/2022 - Present    Overview Signed 09/04/2022  9:49 AM by Cleotilde Oneil Novel, MD  Pulmonary embolism on aspirin , fairly immobile, chronic Xarelto, 3/24      Hypertension I10   08/22/2022 - Present    B12 deficiency E53.8   12/01/2021 - Present    Overview Signed 12/01/2021  4:43 PM by Cleotilde Oneil Novel, MD  239, 5/23      Type 2 diabetes mellitus without complication (CMS/HHS-HCC) E11.9   04/10/2021 - Present    Overview Addendum 05/01/2022  1:01 PM by Cleotilde Oneil Novel, MD  Diet-controlled Normal echo, 11/23      Medicare annual wellness visit, initial Z00.00   04/10/2021 - Present    Overview Addendum 12/28/2023  7:28 AM by Cleotilde Oneil Novel, MD  7/25      CKD (chronic kidney disease) stage 4, GFR 15-29 ml/min (CMS/HHS-HCC) N18.4   10/10/2020 - Present    Overview Addendum 09/04/2022  9:30 AM by Cleotilde Oneil Novel, MD  Baseline creatinine 1.6-2.0, farxiga      Laryngopharyngeal reflux (LPR) K21.9   09/20/2020 - Present    Overview Signed 09/20/2020  8:41 AM by Cleotilde Oneil Novel, MD  Dexilant /famotidine       RAD (reactive airway disease), moderate persistent, uncomplicated (HHS-HCC) J45.40   09/20/2020 - Present    Overview Addendum 02/17/2022  3:24 PM by Cleotilde Oneil Novel, MD  Perennial, Flovent  8/23 dyspnea work-up: Normal chest CTA, normal brain CT, normal chest x-ray, normal echo, normal SPECT Myoview      History of diverticulitis of colon Z87.19   03/20/2019 - Present    Overview Signed 03/20/2019  2:22 PM by Aundria Ladell Eck, MD  October 2019 status post hospital admission-Dr. Tye      Hyperlipidemia, mixed E78.2   11/23/2014 - Present    Overview Signed 02/12/2023  9:03 AM by Layman Mliss Cleotilde, PA  01/2023 TC 107, TG 145, HDL 31, LDL 47 Repatha      Intervertebral cervical disc disorder with myelopathy,  cervical region M50.00   10/11/2008 - Present    Overview Addendum 05/01/2022  4:14 PM by Cleotilde Oneil Novel, MD  Postop C3-C7, multiple cervical surgeries, cervical stenosis Cervical cord myelomalacia-creating lower extremity weakness      RESOLVED: Weakness of right upper extremity R29.898   04/05/2023 - 04/14/2023    Overview Signed 04/05/2023  1:39 PM by Estelle Palma, CMA  Last Assessment & Plan:   Formatting of this note might be different from the original.  Outpatient PT follow-up      RESOLVED: Acute on chronic heart failure with  preserved ejection fraction (CMS/HHS-HCC) I50.33   02/09/2023 - 03/30/2023    RESOLVED: Functional neurological symptom disorder with mixed symptoms F44.7   04/24/2022 - 04/21/2023    Overview Signed 04/05/2023  1:39 PM by Estelle Palma, CMA  Last Assessment & Plan:   Formatting of this note might be different from the original.  Seen by neurology here and recommended outpatient neurological follow-up.  Initially was thought to have a stroke and was given TNK.  MRI of the brain negative.  Neurology did not recommend statin and aspirin .  Physical therapy recommended outpatient PT.      RESOLVED: Otitis media H66.90   04/24/2022 - 04/14/2023    Overview Signed 04/05/2023  1:39 PM by Estelle Palma, CMA  Last Assessment & Plan:   Formatting of this note might be different from the original.  Patient having pain in the area of right ear and tympanic membrane is erythematous.  Given 1 dose of Unasyn  and will prescribe Augmentin  upon going home.      RESOLVED: Diverticulitis K57.92   05/26/2021 - 04/14/2023    RESOLVED: Stage 3b chronic kidney disease (CMS-HCC) N18.32   09/20/2020 - 10/10/2020    Overview Signed 09/20/2020  8:45 AM by Cleotilde Oneil Novel, MD  Baseline creatinine 1.8      RESOLVED: History of TIA (transient ischemic attack) Z86.73   08/11/2019 - 04/21/2023    Overview Signed 04/05/2023  1:39 PM by Estelle Palma, CMA   Formatting of this note is different from the original.  (suspected)  Presented [facility] ER 07/22/2019  Followed up dr. Maree: Cryptogenic stroke vs TIA vs functional episode, need to rule out seizure: reported stroke who presented with symptoms of right-sided weakness, dysarthria, expressive aphasia and receptive aphasia with last known normal of 16:00 on 07/21/2018. IV-tPA, which was administered within 60 minutes of presentation. tPA infusion was stopped after the bolus due to concern for anaphylaxis. Exam with improvement with NIHSS 9 -> 4.      RESOLVED: Acute right-sided weakness R53.1   07/23/2019 - 09/20/2020    RESOLVED: Headache, cervicogenic G44.86   05/17/2019 - 09/20/2020    RESOLVED: Chronic constipation K59.09   03/20/2019 - 09/20/2020    RESOLVED: GERD (gastroesophageal reflux disease) K21.9   03/20/2019 - 09/20/2020    RESOLVED: Pharyngeal dysphagia R13.13   03/20/2019 - 09/20/2020    RESOLVED: Personal history of colonic polyps Z86.0100   03/20/2019 - 05/01/2022    RESOLVED: Diverticulitis large intestine K57.32   04/11/2018 - 09/20/2020    RESOLVED: Prediabetes R73.03   10/21/2016 - 09/20/2020    RESOLVED: Elevated troponin I level R79.89   01/27/2016 - 09/20/2020    RESOLVED: Chest pain R07.9   01/27/2016 - 09/20/2020    RESOLVED: Dyspnea R06.00   01/02/2016 - 09/20/2020    RESOLVED: Chest pain on exertion R07.9   01/02/2016 - 01/27/2016    RESOLVED: GERD (gastroesophageal reflux disease) (Chronic) K21.9   01/02/2016 - 09/20/2020    RESOLVED: Chewing tobacco use (Chronic) Z72.0   01/02/2016 - 09/20/2020    RESOLVED: Allergic rhinitis, seasonal J30.2   11/23/2014 - 09/20/2020    RESOLVED: Injury brain, traumatic (CMS-HCC) S06.9XAA   11/23/2014 - 09/20/2022    Overview Addendum 05/01/2022  1:01 PM by Cleotilde Oneil Novel, MD  2010 at construction site, steel beam injury to the head, residual reduced memory, ataxia, poor concentration Normal brain MRI, 11/23      RESOLVED: Anxiety and depression (Chronic)  F41.9, F32.A   10/11/2008 - 09/20/2020  RESOLVED: Anxiety F41.9   10/11/2008 - 04/21/2023    Overview Signed 04/05/2023  1:39 PM by Estelle Palma, CMA  Last Assessment & Plan:   Formatting of this note might be different from the original.  Continue BuSpar       SOCIAL HX: Social History   Socioeconomic History   Marital status: Married  Tobacco Use   Smoking status: Former    Current packs/day: 0.00    Average packs/day: 1 pack/day for 4.3 years (4.3 ttl pk-yrs)    Types: Cigarettes    Start date: 08/28/1979    Quit date: 11/28/1983    Years since quitting: 40.6   Smokeless tobacco: Never   Tobacco comments:    light smoker for very short timeframe. Only a half pack a day  Vaping Use   Vaping status: Never Used  Substance and Sexual Activity   Alcohol use: No   Drug use: No   Sexual activity: Yes    Partners: Female    Birth control/protection: None  Social History Narrative   Married.   Social Drivers of Corporate Investment Banker Strain: Low Risk  (12/28/2023)   Overall Financial Resource Strain (CARDIA)    Difficulty of Paying Living Expenses: Not hard at all  Food Insecurity: No Food Insecurity (12/28/2023)   Hunger Vital Sign    Worried About Running Out of Food in the Last Year: Never true    Ran Out of Food in the Last Year: Never true  Transportation Needs: No Transportation Needs (12/28/2023)   PRAPARE - Administrator, Civil Service (Medical): No    Lack of Transportation (Non-Medical): No  Housing Stability: Low Risk  (12/28/2023)   Housing Stability Vital Sign    Unable to Pay for Housing in the Last Year: No    Number of Times Moved in the Last Year: 0    Homeless in the Last Year: No    FAMILY HX: Family History  Problem Relation Age of Onset   Myocardial Infarction (Heart attack) Mother    COPD Mother    High blood pressure (Hypertension) Mother        Deceased   Dementia Mother    Heart disease Father     Myocardial Infarction (Heart attack) Father    No Known Problems Brother    Diabetes type II Maternal Grandmother    Osteoarthritis Maternal Grandmother    Coronary Artery Disease (Blocked arteries around heart) Maternal Grandfather        Deceased   Heart disease Paternal Grandmother    No Known Problems Brother    No Known Problems Brother     CURRENT MEDICATIONS AND ALLERGIES   Current Outpatient Medications  Medication Sig Dispense Refill   acetaminophen  (TYLENOL ) 650 MG ER tablet Take 1,300 mg by mouth every 8 (eight) hours as needed for Pain     albuterol  90 mcg/actuation inhaler Inhale 2 inhalations into the lungs every 4 (four) hours as needed for Wheezing     blood glucose diagnostic (ONETOUCH ULTRA TEST) test strip USE 1 STRIP DAILY AS NEEDED AS INSTRUCTED 50 strip 3   blood-glucose sensor (DEXCOM G7 SENSOR) Devi Use 1 each every 10 (ten) days 9 each 11   blood-glucose,receiver,cont (DEXCOM G7 RECEIVER) Misc Use 1 Device as directed 1 each 0   colchicine (COLCRYS) 0.6 mg tablet Take 1 tablet (0.6 mg total) by mouth once daily 20 tablet 0   erenumab-aooe 140 mg/mL AtIn Inject 140 mg subcutaneously every 28 (  twenty-eight) days 1 mL 11   evolocumab (REPATHA SURECLICK) 140 mg/mL PnIj Inject 140 mg subcutaneously every 14 (fourteen) days 1 mL 11   famotidine  (PEPCID ) 40 MG tablet Take 1 tablet (40 mg total) by mouth at bedtime 90 tablet 3   fexofenadine (ALLEGRA) 180 MG tablet Take 180 mg by mouth once daily     fluticasone  propionate (FLONASE ) 50 mcg/actuation nasal spray Place 2 sprays into both nostrils 2 (two) times daily 96 g 0   FUROsemide (LASIX) 40 MG tablet Take 0.5 tablets (20 mg total) by mouth once daily as needed     glimepiride (AMARYL) 1 MG tablet Take 0.5 tablets (0.5 mg total) by mouth daily with breakfast 45 tablet 3   lancets (ONETOUCH DELICA PLUS LANCET) USE TO CHECK BLOOD SUGAR DAILY 100 each 3   NURTEC ODT  75 mg disintegrating tablet  DISSOLVE 1 TABLET BY MOUTH DAILY AS NEEDED 16 tablet 3   omeprazole (PRILOSEC) 40 MG DR capsule TAKE 1 CAPSULE BY MOUTH DAILY 90 capsule 1   rivaroxaban (XARELTO) 10 mg tablet Take 1 tablet (10 mg total) by mouth once daily 90 tablet 3   tamsulosin (FLOMAX) 0.4 mg capsule Take 1 capsule (0.4 mg total) by mouth once daily 90 capsule 3   blood glucose diagnostic test strip Use 1 each (1 strip total) 3 (three) times daily (Patient taking differently: 1 strip 3 (three) times daily Checks 3 times a week on Mon,Wed and Friday) 100 each 0   blood glucose diagnostic test strip 1 each (1 strip total) once daily as needed Use as instructed. 50 each 11   blood glucose meter kit Use as directed 1 each 0   blood glucose meter kit as directed 1 each 0   losartan (COZAAR) 25 MG tablet Take 1 tablet (25 mg total) by mouth once daily 90 tablet 3   No current facility-administered medications for this visit.    Allergies  Allergen Reactions   Alteplase Angioedema and Swelling    Fascial flushing and tongue swelling. At [facility]   Montelukast Headache and Other (See Comments)    Other Reaction(s): Headache, Other (see comments)    Causes headaches  Causes headaches   Nefazodone Swelling and Other (See Comments)    Tongue and facial swelling   Plavix [Clopidogrel] Headache   Other Rash and Swelling    Surgical staples  Can not have dye in IV, due to low kidney function    Surgical staples   Amlodipine Other (See Comments)    Joint pain    Doxycycline  Other (See Comments)    Severe headaches  Severe headaches    Severe headaches Severe headaches   Farxiga [Dapagliflozin] Headache   Fluoxetine Other (See Comments)    Fatigue   Jardiance [Empagliflozin] Other (See Comments)    Joint pain   Nexium Iv [Esomeprazole Sodium] Rash   Paroxetine  Hcl Other (See Comments)    does not feel right in the head   Rosuvastatin  Headache   Venlafaxine Other (See Comments)    Feels out  of it   Codeine Nausea and Other (See Comments)    Dizziness, sweating   Esomeprazole Rash    Answers submitted by the patient for this visit: Recent Medical Symptoms (Submitted on 07/03/2024) Daytime sleepiness: Yes Sore throat: No Hoarseness: No Shortness of breath: Yes leg pain: Yes Abdominal pain: Yes Bowel habits change: Yes Frequency (The need to urinate many times a day): Yes Hesitancy (Difficulty in beginning the flow of  urine): Yes Joint pain: Yes Joint swelling: Yes Myalgias (Muscle pain): Yes Paresthesias (Pins and Needles): Yes Loss of balance: Yes   PHYSICAL EXAM   Vitals:   07/03/24 0934  BP: 138/78  BP Location: Left upper arm  Patient Position: Sitting  BP Cuff Size: Adult  Pulse: 68  Resp: 21  SpO2: 99%  Weight: (!) 102.1 kg (225 lb)  Height: 177.8 cm (5' 10)   160/84 standing  Wt Readings from Last 3 Encounters:  07/03/24 (!) 102.1 kg (225 lb)  06/05/24 99.8 kg (220 lb)  05/24/24 100 kg (220 lb 6.4 oz)     BP Readings from Last 3 Encounters:  07/03/24 138/78  06/19/24 (!) 148/80  06/05/24 (!) 140/80       General Appearance:  Alert, cooperative, no distress, appears stated age, pleasant and polite.  Overweight .   HEENT:  PERRL,oropharynx clear, L exotropia  Neck: Carotids 2+ bilaterally, no carotid bruits, JVD is normal with mild HJR  Lungs:   Clear to auscultation bilaterally, respirations unlabored  Cardiovascular:  Regular rate and rhythm, normal S1 and S2, no murmurs/rubs/gallops, non-displaced PMI, no RV heave  Abdomen:   Soft, non-tender, bowel sounds active,  no masses, no organomegaly  Extremities: Extremities normal, no cyanosis 1+ edema to knee.  .  Pulses: Dorsalis pedis 2+ and symmetric bilaterally  Skin: No lower extremity rashes or ulcers  Neurologic: Alert, interactive, and appropriate, grossly moving all 4 extremities   DATA/RESULTS   Recent Labs    08/17/23 0712 12/21/23 0813 06/27/24 0801  CHOLTOTAL 164 178  155  HDL 39.7 33.8 34.7  LDLCALC 106 123 102  VLDL 19 21 18   TRIG 94 107 92    Recent Labs    02/01/23 1129 02/09/23 0953 04/14/23 0926 09/10/23 1056 10/22/23 1433 12/21/23 0813 02/07/24 1532 06/27/24 0801  NA 139 137   < > 138  134*   < > 141 139 139  K 4.1 3.8   < > 3.9   < > 4.4 4.2 3.9  CL 108 105   < > 106   < > 107 103 104  CO2 24 25   < > 16*   < > 23.7 31.1 28.4  BUN 18 19   < > 20   < > 19 18 20   CREATININE 1.8* 2.0*   < > 1.8*   < > 1.9* 1.8* 1.8*  BUNCRE 10 10  --  11  --   --   --   --   GFR 43 38   < > 43   < > 40* 43* 43*  GLUCOSE 110 129   < > 98   < > 101 74 82  MG  --   --   --  1.8  --   --   --   --   ALT 20  --    < > 25   < > 18 21 21   AST 18  --    < > 38   < > 19 18 14   TBILI 0.8  --    < > 1.5   < > 0.6 0.6 0.5  ALB 3.4*  --    < > 3.6   < > 3.9 4.0 3.7   < > = values in this interval not displayed.    Recent Labs    12/21/23 0813 02/07/24 1532 06/27/24 0801  WBC 5.5 6.2 6.5  HGB 14.8 14.9 14.7  HCT 43.3  43.8 42.9  PLT 181 182 192    Recent Labs    11/27/22 0908 12/25/22 1148 02/01/23 1129 04/14/23 0926 08/17/23 0712 09/10/23 1056 12/21/23 0813 06/27/24 0801  INR  --   --   --   --   --  1.1  --   --   TSH 1.609  --   --   --   --   --  1.657 1.760  HGBA1C 7.8*   < >  --    < > 8.1*  --  7.3* 7.1*  PROBNP  --   --  67  --   --  51  --   --    < > = values in this interval not displayed.     ECG 09/20/23 SB 58 bpm, NI  Imaging CPX 09/10/23 INTERPRETATION ---------------------------------------------------------------     Conclusion:   1)  This is a sub-maximal exercise test, so maximal cardiorespiratory   capacity could not be determined.   2)  Based on the oxygen consumption (VO2) achieved, functional capacity   would be consistent with a moderate functional impairment, with VO2 12.6   ml/kg/min (47% of predicted normals). Normative peak predicted VO2 values   are corrected for age, gender, and weight.   3)  There were  signs of a mixed limitation to exercise, with evidence of   ventilation - perfusion mismatch with an elevated VE-VCO2 slope at 47.97   (173% of predicted normal). ECG notable for baseline normal sinus rhythm.   No ectopy occurred during the test. No ST segment changes. Heart rate   recovery was normal.   4)  Aerobic reserve was low-normal suggesting physical deconditioning may be   a potential limitation to exercise capacity.   5)  Ventilatory reserve limits were not calculated due to not being able to   complete MVV maneuver. Normal pulmonary physiology at rest. MVV was not   completed due to patient having near syncopal episode after spirometry.   Exercise oscillatory ventilation criteria was not met. but appeared to be   mildly present although could be due to patients hyperventilating which   occurred from the time he came to the lab until he left.  That is also most   like the cause of his dysfunctional breathing pattern.  It is hard to   determine if physiologic or not since it was a submaximal test.   6)  The O2 pulse (an indicator of stroke volume) reached a plateau at HR of   94 bpm.   7)  No prior CPX available for comparison     This is the metabolic report for the Cardiopulmonary Exercise Test. The   Stress ECG report is available on a separate report.  LHC 01/08/23 Impressions:   Severe 90% R-PDA stenosis, large branch vessel s/p Xience 2.5 x 18 mm DES deployed at high pressure Mild-moderate CAD in the LAD Minimal CAD in the Lcx Normal LVEDP on left heart catheterization: LVEDP 6 mm Hg   Recommendations:   Risk factor modification and  prevention measures Aggressive medical therapy for CAD Routine post-cath PCI care; sheath out, TR band applied Refer for cardiac rehab P2Y12 inhibitor for at least 6  months; resume apixaban Discussed with DR. Blazing-primary cardiologist  Echo pharm stress 12/25/22 STRESS ECG RESULTS  -----------------------------------------------------------    ECG Results: Nondiagnostic due to inadequate heart rate. none.   INTERPRETATION ---------------------------------------------------------------    INDETERMINATE.  NO VALVULAR REGURGITATION  NO VALVULAR STENOSIS  Note: NORMAL  RESTING STUDY WITH NO WALL MOTION ABNORMALITIES AT REST AND  SUBOPTIMAL STRESS  Maximum workload of  1.00 METs was achieved during exercise.  NORMAL RESTING BP - APPROPRIATE RESPONSE  Lab Results  Component Value Date   CREATININE 1.8 (H) 06/27/2024   BUN 20 06/27/2024   NA 139 06/27/2024   K 3.9 06/27/2024   CL 104 06/27/2024   CO2 28.4 06/27/2024    ASSESSMENT AND PLAN  1. Coronary artery disease involving native coronary artery of native heart without angina pectoris -     Basic Metabolic Panel (BMP); Future  2. Primary hypertension -     Basic Metabolic Panel (BMP); Future  3. DOE (dyspnea on exertion) -     Basic Metabolic Panel (BMP); Future  4. CKD (chronic kidney disease) stage 4, GFR 15-29 ml/min (CMS/HHS-HCC) -     Basic Metabolic Panel (BMP); Future     Assessment & Plan Chest Pain with stent to RCA in 2024 and residual 50% LAD.  Shortness of breath GERD   Intermittent chest pain, not associated with exertion is still present. Pain relief with movement supports non-cardiac origin. Exertion now limited by legs and cervical spinal stenosis.  - BP has been elevated lately. Amlodipine was started but intolerant with leg and knee pain.  Stopped.  Not on ACE/ARB with CKD.  All CP currently relieved by burping.  Hx of GERD.  CP before stent not back and DOE better.  . - Coronary Artery Disease (CAD) CAD with most recent [facility] 01/08/23 with severe 90% R-PDA stenosis now s/p PCI, otherwise mild to moderate CAD in LAD and minimal disease L circ, He notes sx with that disease are better nowPulm has concerns about diaphragm dysfunction from prior spinal cord injury which may be contributing.  CPX was stopped early due to SOB and chest pain. Results demonstrate poor functional capacity, no ECG changes, ventilatory reserve limits not calculated d/t unable to complete test. He presented to the ER after CPX for SOB. Trops and d-dimer were normal. Ativan  resolved SOB.  Now 18 mo out on Prasugrel. And on Xarelto 10.   Stopped Prasugrel and no ASA on Xarelto  - Continue CV meds:  Antiplatelet(s)/Anticoagulant(s): prasugrel 10 mg daily, Xarelto 10 mg daily ACEi/ARB/ARNi: losartan-HCTZ 50-12.5 mg daily fell of med list.  Now back pon losartan at 25 and titrate to 100 as neeed for BP < 130  Diuretics: lasix 20 mg daily prn, not taking  JVP is low  mild HJR noted   consider spiro next   Lipid-lowering agents: Repatha 140 mg inj q 2 weeks   HTN - see above  Lipids. - LDL not 70 which is goal.  Zetia once BP controlled.    Med intolerances - one set of meds titrated at a time.     Orders Placed This Encounter  Procedures   Basic Metabolic Panel (BMP)    DISPOSITION   Follow up with Olam Birder Arenas, NP in 8 weeks (on 08/28/2024).  Follow up in 1 year (on 07/03/2025). Follow-up     Normal Orders This Visit   Follow up in Cardiology [REF12F Custom]    Questions:   Who is this follow-up with?: Me   What visit type is needed?: Card Return   Allow telemedicine?: In Person Only   Does this order need to be coordinated with another visit or should it be hidden from the patient portal? If yes to either, the patient will need to stop by the front  desk or call to schedule.: No   Can this appointment be overbooked by a scheduler?: No    Future Labs/Procedures Expected by Expires   Follow up in Cardiology [REF12F Custom]  08/28/2024 12/31/2025   Questions:     Who is this follow-up with?: Specific Provider   Provider: EMORY OLAM HAKIM   What visit type is needed?: Card Return   Allow telemedicine?: In Person Only   Does this order need to be coordinated with another visit or should it be  hidden from the patient portal? If yes to either, the patient will need to stop by the front desk or call to schedule.: No   Can this appointment be overbooked by a scheduler?: No   Follow up in Cardiology [[id number] Custom]  07/03/2025 12/31/2025   Questions:     Who is this follow-up with?: Me   What visit type is needed?: Card Return   Allow telemedicine?: In Person Only   Does this order need to be coordinated with another visit or should it be hidden from the patient portal? If yes to either, the patient will need to stop by the front desk or call to schedule.: No   Can this appointment be overbooked by a scheduler?: No          Future Appointments     Date/Time Provider Department Center Visit Type   07/04/2024 10:45 AM Cleotilde Oneil Novel, MD [facility] OFFICE VISIT   08/15/2024 5:15 PM (Arrive by 4:45 PM) CC MR 1 [facility] MRI LUMBAR SPINE WO   08/29/2024 1:15 PM Maree Jannett Hering, MD [facility] C RETURN VISIT   09/29/2024 9:00 AM (Arrive by 8:30 AM) Samule Ozell HERO, MD [facility] NEW PATIENT   10/16/2024 11:30 AM (Arrive by 11:00 AM) Emory Olam Hakim, NP [facility] Cardiology [facility] CARD RETURN   10/31/2024 1:15 PM Maree Jannett Hering, MD [facility] C RETURN VISIT       Attestation Statement:   I personally performed the service. (TP)  MICHAEL A BLAZING, MD  Billing based on complexity  Patient Instructions  You need better BP and lipid control  We need to address one of these issues at a time because of your med intolerances.  Starting losartan 25 mg today.  I feel you will need 50-100 but starting slow.  This lowers BP.  Helps preserve renal function as well.  Will move up in 4 weeks if BP is not < 130 at home .  Check BP at home nightly and send to me on My Chart.  Weekly is good.    Once we get BP controlled will add med  called zetia for LDL to get closer to 70 your goal.  Continue repatha.    For swelling will address if persists in the future with med called spironolactone.    Stop Prasugrel no longer needed.  No ASA needed with you on Xarelto.  Follow up 3 months with APP.    Labs in a 1-2 weeks from PCP.     "

## 2024-07-07 ENCOUNTER — Ambulatory Visit: Payer: Self-pay | Admitting: Neurosurgery

## 2024-07-07 ENCOUNTER — Telehealth: Payer: Self-pay | Admitting: Neurosurgery

## 2024-07-07 ENCOUNTER — Other Ambulatory Visit: Payer: Self-pay | Admitting: Neurosurgery

## 2024-07-07 NOTE — Telephone Encounter (Signed)
 Called reading room and asked for xray to be read.  Almarie can you call the patient with information below in case he is upset?

## 2024-07-07 NOTE — Telephone Encounter (Signed)
 Pt sees Dr.Shah and is wanting his results routed over to this location when the report is complete.

## 2024-07-07 NOTE — Telephone Encounter (Addendum)
 Patient expressed dissatisfaction with not receiving any communication regarding their X-ray results. They stated that they have consulted another physician, who indicated that their condition is more serious than previously advised. The patient no longer wishes to continue care with [facility] and plans to seek treatment elsewhere. They have requested that their X-ray results be forwarded to their new provider. Advised pt of process for xray reports and turn around time & why they haven't gotten a response yet. FYI

## 2024-07-11 NOTE — Telephone Encounter (Signed)
 See other phone note and mychart message. Orders cancelled and patient is going to see another office
